# Patient Record
Sex: Male | Born: 1945 | Race: White | Hispanic: No | Smoking: Current every day smoker
Health system: Southern US, Community
[De-identification: ages and names within clinical notes are randomized; demographics above are authoritative.]

## PROBLEM LIST (undated history)

## (undated) DIAGNOSIS — I714 Abdominal aortic aneurysm, without rupture, unspecified: Secondary | ICD-10-CM

## (undated) DIAGNOSIS — F329 Major depressive disorder, single episode, unspecified: Secondary | ICD-10-CM

## (undated) DIAGNOSIS — F32A Depression, unspecified: Secondary | ICD-10-CM

## (undated) DIAGNOSIS — F419 Anxiety disorder, unspecified: Secondary | ICD-10-CM

## (undated) DIAGNOSIS — I219 Acute myocardial infarction, unspecified: Secondary | ICD-10-CM

## (undated) DIAGNOSIS — G40909 Epilepsy, unspecified, not intractable, without status epilepticus: Secondary | ICD-10-CM

## (undated) DIAGNOSIS — Z87828 Personal history of other (healed) physical injury and trauma: Secondary | ICD-10-CM

## (undated) DIAGNOSIS — I82409 Acute embolism and thrombosis of unspecified deep veins of unspecified lower extremity: Secondary | ICD-10-CM

## (undated) DIAGNOSIS — M1990 Unspecified osteoarthritis, unspecified site: Secondary | ICD-10-CM

## (undated) HISTORY — DX: Acute embolism and thrombosis of unspecified deep veins of unspecified lower extremity: I82.409

## (undated) HISTORY — DX: Unspecified osteoarthritis, unspecified site: M19.90

## (undated) HISTORY — DX: Major depressive disorder, single episode, unspecified: F32.9

## (undated) HISTORY — DX: Abdominal aortic aneurysm, without rupture: I71.4

## (undated) HISTORY — DX: Abdominal aortic aneurysm, without rupture, unspecified: I71.40

## (undated) HISTORY — DX: Depression, unspecified: F32.A

## (undated) HISTORY — DX: Acute myocardial infarction, unspecified: I21.9

## (undated) HISTORY — PX: SPINE SURGERY: SHX786

## (undated) HISTORY — DX: Anxiety disorder, unspecified: F41.9

## (undated) HISTORY — DX: Personal history of other (healed) physical injury and trauma: Z87.828

## (undated) HISTORY — PX: CARDIAC CATHETERIZATION: SHX172

## (undated) HISTORY — DX: Epilepsy, unspecified, not intractable, without status epilepticus: G40.909

## (undated) HISTORY — PX: VASECTOMY: SHX75

---

## 1981-02-05 DIAGNOSIS — Z87828 Personal history of other (healed) physical injury and trauma: Secondary | ICD-10-CM

## 1981-02-05 HISTORY — PX: BACK SURGERY: SHX140

## 1981-02-05 HISTORY — DX: Personal history of other (healed) physical injury and trauma: Z87.828

## 1998-04-01 ENCOUNTER — Ambulatory Visit (HOSPITAL_COMMUNITY): Admission: RE | Admit: 1998-04-01 | Discharge: 1998-04-01 | Payer: Self-pay | Admitting: Internal Medicine

## 1998-04-01 ENCOUNTER — Encounter: Payer: Self-pay | Admitting: Internal Medicine

## 1998-04-25 ENCOUNTER — Encounter: Admission: RE | Admit: 1998-04-25 | Discharge: 1998-07-24 | Payer: Self-pay | Admitting: Anesthesiology

## 1998-04-25 ENCOUNTER — Encounter: Payer: Self-pay | Admitting: Anesthesiology

## 2001-04-22 ENCOUNTER — Encounter: Admission: RE | Admit: 2001-04-22 | Discharge: 2001-04-22 | Payer: Self-pay | Admitting: Internal Medicine

## 2001-04-22 ENCOUNTER — Encounter: Payer: Self-pay | Admitting: Internal Medicine

## 2001-05-28 ENCOUNTER — Encounter: Payer: Self-pay | Admitting: Neurology

## 2001-05-28 ENCOUNTER — Ambulatory Visit (HOSPITAL_COMMUNITY): Admission: RE | Admit: 2001-05-28 | Discharge: 2001-05-28 | Payer: Self-pay | Admitting: Neurology

## 2001-12-09 ENCOUNTER — Emergency Department (HOSPITAL_COMMUNITY): Admission: EM | Admit: 2001-12-09 | Discharge: 2001-12-09 | Payer: Self-pay | Admitting: Emergency Medicine

## 2001-12-09 ENCOUNTER — Encounter: Payer: Self-pay | Admitting: Emergency Medicine

## 2003-01-09 ENCOUNTER — Emergency Department (HOSPITAL_COMMUNITY): Admission: EM | Admit: 2003-01-09 | Discharge: 2003-01-09 | Payer: Self-pay | Admitting: Emergency Medicine

## 2004-08-06 ENCOUNTER — Emergency Department (HOSPITAL_COMMUNITY): Admission: EM | Admit: 2004-08-06 | Discharge: 2004-08-06 | Payer: Self-pay | Admitting: Emergency Medicine

## 2004-08-19 ENCOUNTER — Emergency Department (HOSPITAL_COMMUNITY): Admission: EM | Admit: 2004-08-19 | Discharge: 2004-08-20 | Payer: Self-pay | Admitting: Emergency Medicine

## 2006-04-22 ENCOUNTER — Emergency Department (HOSPITAL_COMMUNITY): Admission: EM | Admit: 2006-04-22 | Discharge: 2006-04-22 | Payer: Self-pay | Admitting: Family Medicine

## 2006-05-24 ENCOUNTER — Encounter: Admission: RE | Admit: 2006-05-24 | Discharge: 2006-05-24 | Payer: Self-pay | Admitting: Orthopaedic Surgery

## 2006-07-05 ENCOUNTER — Ambulatory Visit: Payer: Self-pay | Admitting: Vascular Surgery

## 2007-07-30 ENCOUNTER — Ambulatory Visit: Payer: Self-pay | Admitting: Vascular Surgery

## 2008-08-04 ENCOUNTER — Ambulatory Visit: Payer: Self-pay | Admitting: Vascular Surgery

## 2008-08-05 DIAGNOSIS — I219 Acute myocardial infarction, unspecified: Secondary | ICD-10-CM

## 2008-08-05 HISTORY — DX: Acute myocardial infarction, unspecified: I21.9

## 2009-07-27 ENCOUNTER — Ambulatory Visit: Payer: Self-pay | Admitting: Vascular Surgery

## 2009-08-14 ENCOUNTER — Encounter: Payer: Self-pay | Admitting: Cardiology

## 2009-08-16 ENCOUNTER — Encounter: Payer: Self-pay | Admitting: Cardiology

## 2009-08-31 ENCOUNTER — Telehealth (INDEPENDENT_AMBULATORY_CARE_PROVIDER_SITE_OTHER): Payer: Self-pay | Admitting: *Deleted

## 2009-09-08 ENCOUNTER — Telehealth: Payer: Self-pay | Admitting: Cardiology

## 2009-09-20 DIAGNOSIS — I714 Abdominal aortic aneurysm, without rupture: Secondary | ICD-10-CM

## 2009-09-21 ENCOUNTER — Ambulatory Visit: Payer: Self-pay | Admitting: Cardiology

## 2009-09-21 DIAGNOSIS — I10 Essential (primary) hypertension: Secondary | ICD-10-CM | POA: Insufficient documentation

## 2009-09-27 ENCOUNTER — Telehealth (INDEPENDENT_AMBULATORY_CARE_PROVIDER_SITE_OTHER): Payer: Self-pay | Admitting: *Deleted

## 2009-09-28 ENCOUNTER — Ambulatory Visit: Payer: Self-pay | Admitting: Cardiology

## 2009-09-28 ENCOUNTER — Ambulatory Visit: Payer: Self-pay

## 2009-09-28 ENCOUNTER — Encounter: Payer: Self-pay | Admitting: Cardiology

## 2009-09-28 ENCOUNTER — Encounter (HOSPITAL_COMMUNITY): Admission: RE | Admit: 2009-09-28 | Discharge: 2009-10-25 | Payer: Self-pay | Admitting: Cardiology

## 2009-09-29 ENCOUNTER — Ambulatory Visit: Payer: Self-pay | Admitting: Internal Medicine

## 2009-09-29 DIAGNOSIS — E118 Type 2 diabetes mellitus with unspecified complications: Secondary | ICD-10-CM

## 2009-09-29 DIAGNOSIS — E1165 Type 2 diabetes mellitus with hyperglycemia: Secondary | ICD-10-CM | POA: Insufficient documentation

## 2009-09-29 DIAGNOSIS — F17200 Nicotine dependence, unspecified, uncomplicated: Secondary | ICD-10-CM | POA: Insufficient documentation

## 2009-09-29 LAB — CONVERTED CEMR LAB
BUN: 10 mg/dL (ref 6–23)
Basophils Absolute: 0.1 10*3/uL (ref 0.0–0.1)
Basophils Relative: 0.8 % (ref 0.0–3.0)
Chloride: 104 meq/L (ref 96–112)
Eosinophils Absolute: 0.3 10*3/uL (ref 0.0–0.7)
Eosinophils Relative: 4.5 % (ref 0.0–5.0)
Lymphocytes Relative: 42 % (ref 12.0–46.0)
MCV: 96.7 fL (ref 78.0–100.0)
Monocytes Absolute: 0.5 10*3/uL (ref 0.1–1.0)
Monocytes Relative: 8.1 % (ref 3.0–12.0)
Neutrophils Relative %: 44.6 % (ref 43.0–77.0)
Platelets: 219 10*3/uL (ref 150.0–400.0)
RDW: 13.9 % (ref 11.5–14.6)
Sodium: 138 meq/L (ref 135–145)
Total Protein: 7.2 g/dL (ref 6.0–8.3)
WBC: 6.7 10*3/uL (ref 4.5–10.5)

## 2009-10-03 ENCOUNTER — Encounter: Admission: RE | Admit: 2009-10-03 | Discharge: 2009-10-03 | Payer: Self-pay | Admitting: Internal Medicine

## 2009-10-13 ENCOUNTER — Ambulatory Visit: Payer: Self-pay | Admitting: Internal Medicine

## 2009-10-13 LAB — CONVERTED CEMR LAB: LDL Goal: 70 mg/dL

## 2009-10-20 ENCOUNTER — Ambulatory Visit: Payer: Self-pay | Admitting: Cardiology

## 2009-10-21 ENCOUNTER — Encounter: Payer: Self-pay | Admitting: Internal Medicine

## 2009-10-25 LAB — CONVERTED CEMR LAB
Alkaline Phosphatase: 43 units/L (ref 39–117)
Bilirubin, Direct: 0.2 mg/dL (ref 0.0–0.3)
Cholesterol: 134 mg/dL (ref 0–200)
HDL: 34.2 mg/dL — ABNORMAL LOW (ref >39.00)
Total Bilirubin: 0.4 mg/dL (ref 0.3–1.2)
Total CHOL/HDL Ratio: 4
Triglycerides: 127 mg/dL (ref 0.0–149.0)

## 2009-12-09 ENCOUNTER — Telehealth: Payer: Self-pay | Admitting: Cardiology

## 2009-12-19 ENCOUNTER — Ambulatory Visit: Payer: Self-pay

## 2009-12-19 ENCOUNTER — Ambulatory Visit: Payer: Self-pay | Admitting: Cardiology

## 2009-12-19 ENCOUNTER — Ambulatory Visit (HOSPITAL_COMMUNITY): Admission: RE | Admit: 2009-12-19 | Discharge: 2009-12-19 | Payer: Self-pay | Admitting: Cardiology

## 2009-12-19 ENCOUNTER — Encounter: Payer: Self-pay | Admitting: Cardiology

## 2009-12-21 ENCOUNTER — Encounter: Payer: Self-pay | Admitting: Cardiology

## 2009-12-22 ENCOUNTER — Ambulatory Visit: Payer: Self-pay | Admitting: Cardiology

## 2010-03-03 ENCOUNTER — Telehealth: Payer: Self-pay | Admitting: Cardiology

## 2010-03-07 NOTE — Assessment & Plan Note (Signed)
Summary: np6/stint placement fu/mt   Visit Type:  new pt visit Primary Provider:  NO PCP AT THIS TIME  CC:  EDEMA/ANKLES...SOB....DENIES ANY CP.  History of Present Illness: 65 yo with history of diabetes and smoking presents for followup after recent inferior MI with PCI at [facility] in [place].  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  He was in [place] in 7/11 when he developed severe chest pain radiating to his neck and was admitted to [facility] with inferior MI.  He had a subtotal RCA occlusion and had 2 Xience DES to the RCA.  EF was 50% by LV-gram and echo.  Since discharge from the hospital, he has been taking his Crestor, diabetes meds, and aspirin but he has not been taking his Effient, ramipril, or Toprol XL due to cost.  Since discharge, he has been feeling well with no recurrent chest pain.  He has been somewhat limited by back pain for years (disabled) but does not have significant exertional dyspnea.  He does have some mild shortness of breath with a flight of steps.  He has been splitting wood in his back yard with no problem.  He has cut back on smoking to 6 cigarettes a day.  He has been taking his diabetes meds.  Fasting blood sugar is running in the 130s.    ECG: NSR, small inferior Qs  Labs (7/11): creatinine 0.87, TnI 8.89  Current Medications (verified): 1)  Glipizide 5 Mg Tabs (Glipizide) .Marland Kitchen.. 1 Tab Once Daily 2)  Crestor 20 Mg Tabs (Rosuvastatin Calcium) .Marland Kitchen.. 1 Tab At Bedtime 3)  Aspirin Ec 325 Mg Tbec (Aspirin) .... Take One Tablet By Mouth Daily 4)  Metformin Hcl 1000 Mg Tabs (Metformin Hcl) .Marland Kitchen.. 1 Tab Two Times A Day 5)  Metoprolol Succinate 25 Mg Xr24h-Tab (Metoprolol Succinate) .... Pt Has Not Picked These Up Yet Said Could Not Afford 6)  Effient 10 Mg Tabs (Prasugrel Hcl) .... Pt Has Not Picked This Up Yet Due To Could Not Afford 7)  Ramipril 2.5 Mg Caps (Ramipril) .... Pt Has Not Picked This Up Yet Due To Could Not Afford  Allergies (verified): 1)  !  Novocain  Past History:  Past Medical History: 1.  AAA: Followed by [facility], last measured 2.3 x 3.5 by ultrasound in 6/11. 2.  CAD: Inferior MI 7/11, hospitalized at [facility] in [place].  LHC: EF 50% with mild inferior hypokinesis, 70% calcified mid LAD, nearly occluded proximal RCA, 60% mid and distal CFX.  Patient had 3.0 x 28 and 3.0 x 18 Xience DES in RCA.  Echo (7/11): Mild LVH, EF 50% with inferior mild hypokinesis, RV normal 3.  Smoker 4.  Diabetes: Poorly controlled in the past 5.  History of back surgery, disabled from back pain.   Family History: Father with MI at 72, Mother with MI at 90, sister and brother both with MIs in 52s  Social History: Lives with son in [place].  Disabled from back pain.  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  Smokes 6 cigs/day (more before).    Review of Systems       All systems reviewed and negative except as per HPI.   Vital Signs:  Patient profile:   65 year old male Height:      69 inches Weight:      155 pounds BMI:     22.97 Pulse rate:   83 / minute Pulse rhythm:   regular BP sitting:   142 / 78  (  left arm) Cuff size:   regular  Vitals Entered By: Danielle Rankin, CMA (September 21, 2009 10:43 AM)  Physical Exam  General:  Well developed, well nourished, in no acute distress. Head:  normocephalic and atraumatic Nose:  no deformity, discharge, inflammation, or lesions Mouth:  Teeth, gums and palate normal. Oral mucosa normal. Neck:  Neck supple, no JVD. No masses, thyromegaly or abnormal cervical nodes. Lungs:  Slightly decreased breath sounds bilaterally.  Heart:  Non-displaced PMI, chest non-tender; regular rate and rhythm, S1, S2. 1/6 HSM at apex.  +S4. Carotid upstroke normal, no bruit. Pedals normal pulses. No edema, no varicosities. Abdomen:  Bowel sounds positive; abdomen soft and non-tender without masses, organomegaly, or hernias noted. No hepatosplenomegaly. Msk:  Back normal, normal gait. Muscle strength and tone  normal. Extremities:  No clubbing or cyanosis. Neurologic:  Alert and oriented x 3. Skin:  Intact without lesions or rashes. Psych:  Normal affect.   Impression & Recommendations:  Problem # 1:  CORONARY ATHEROSCLEROSIS NATIVE CORONARY ARTERY (ICD-414.01) Status post inferior MI with no ischemic symptoms.  He has residual moderate disease (70%) in the mid LAD and 60% in the CFX.  He has not been taking Effient since discharge from the hospital.  EF 50% on echo and LV-gram in 7/11.  - He received Effient 60 mg load in the office today and was given samples to restart Effient 10 mg daily.  He has prescription coverage through Medicare.  I strongly counselled him on the need to take this medication for at least a year.  - I am going to have him take Coreg 6.25 mg two times a day instead of Toprol XL (less expensive).  - Crestor is going to be too expensive for him as well.  I will replace this with simvastatin 40 mg daily and check lipids/LFTs on this med.  - He is going to restart ramipril 2.5 mg daily.  - He will continue ASA 325.  - I am going to set him up for cardiac rehab.  - I am going to set him up for a Lexiscan myoview to assess for ischemia in the LAD/CFX territories given at least moderate lesions in these areas on last cath.  - Echocardiogram in 3 months.   Problem # 2:  ESSENTIAL HYPERTENSION (ICD-401.9) He is going to start Coreg and restart ramipril.  BP is mildly elevated today.   Problem # 3:  SMOKING Patient is cutting back.  I counselled him to stop completely.  He will get nicotine patches (14 mg/day).   Problem # 4:  HYPERCHOLESTEROLEMIA (ICD-272.0) Goal LDL < 70.  He is going to be on simvastatin because of expense with Crestor or Lipitor.  Followup lipids/LFTs.   Problem # 5:  DIABETES Better controlled per patient's report.  Taking metformin and glyburide.  Will check hgbA1c with lipids.  He needs a PCP (will arrange at [facility] office).   Other Orders: Nuclear  Stress Test (Nuc Stress Test) Primary Care Referral (Primary) Echocardiogram (Echo) Cardiac Rehabilitation (Cardiac Rehab)  Patient Instructions: 1)  Your physician has recommended you make the following change in your medication:  2)  Take Effient 10mg  daily---this is important--it helps keep you arteries open. 3)  Stop Metoprolol (Toprol) 4)  Start Coreg 6.25mg  twice a day. 5)  Stop Crestor. 6)  Start Simvastatin(Zocor) 40mg  daily in the evening. 7)  Start Ramipril 2.5mg  twice a day. 8)  Use a Nicotine Patch 14mg   to help you stop smoking. 9)  Your physician recommends that you return for a FASTING lipid profile/liver profile/Hgb A1C in 1 month---414.01 401.9 272.0    10)  Your physician has requested that you have an lexiscan  myoview.  For further information please visit [email or web address]  Please follow instruction sheet, as given. 11)  Your physician has requested that you have an echocardiogram.  Echocardiography is a painless test that uses sound waves to create images of your heart. It provides your doctor with information about the size and shape of your heart and how well your heart's chambers and valves are working.  This procedure takes approximately one hour. There are no restrictions for this procedure. IN 3 MONTHS 12)  Your physician recommends that you schedule a follow-up appointment in: 3 months with Dr Shirlee Latch a few days after you have the echo. 13)  You have been referred to primary care. 14)  Your physician recommends referral and attendance at a Cardiac Rehab Program. Prescriptions: NICOTINE 14 MG/24HR PT24 (NICOTINE) use one daily as directed  #30 x 2   Entered by:   Katina Dung, RN, BSN   Authorized by:   Marca Ancona, MD   Signed by:   Katina Dung, RN, BSN on 09/21/2009   Method used:   Print then Give to Patient   RxID:   [id number] EFFIENT 10 MG TABS (PRASUGREL HCL) one daily  #30 x 11   Entered by:   Katina Dung, RN, BSN   Authorized by:    Marca Ancona, MD   Signed by:   Katina Dung, RN, BSN on 09/21/2009   Method used:   Electronically to        [facility] (retail)       [address]       [place], [place]  [address]       Ph: [phone]       Fax: [phone]   RxID:   [id number] RAMIPRIL 2.5 MG CAPS (RAMIPRIL) one twice a day  #60 x 11   Entered by:   Katina Dung, RN, BSN   Authorized by:   Marca Ancona, MD   Signed by:   Katina Dung, RN, BSN on 09/21/2009   Method used:   Electronically to        [facility] (retail)       [address]       [place], [place]  [address]       Ph: [phone]       Fax: [phone]   RxID:   [id number] COREG 6.25 MG TABS (CARVEDILOL) one twice a day  #60 x 11   Entered by:   Katina Dung, RN, BSN   Authorized by:   Marca Ancona, MD   Signed by:   Katina Dung, RN, BSN on 09/21/2009   Method used:   Electronically to        [facility] (retail)       [address]       [place], [place]  [address]       Ph: [phone]       Fax: [phone]   RxID:   [id number] ZOCOR 40 MG TABS (SIMVASTATIN) one daily in the evening  #30 x 3   Entered by:   Katina Dung, RN, BSN   Authorized by:   Marca Ancona,  MD   Signed by:   Katina Dung, RN, BSN on 09/21/2009   Method used:   Electronically to        [facility] (retail)       [address]       [place], [place]  [address]       Ph: [phone]       Fax: [phone]   RxID:   [id number]

## 2010-03-07 NOTE — Progress Notes (Signed)
  ROI faxed over to [facility]. Arthur Evans  August 31, 2009 8:40 AM     Appended Document:  Recieved records from [facility] gave to [place]

## 2010-03-07 NOTE — Letter (Signed)
Summary: [employer] [facility]   Imported By: Roderic Ovens 10/06/2009 14:42:31  _____________________________________________________________________  External Attachment:    Type:   Image     Comment:   External Document

## 2010-03-07 NOTE — Assessment & Plan Note (Signed)
Summary: Cardiology Nuclear Testing  Nuclear Med Background Indications for Stress Test: Evaluation for Ischemia, Stent Patency   History: Echo, Heart Catheterization, Myocardial Perfusion Study, Stents  History Comments: 7/11 Echo:EF=50%; 7/11 MI>Stent-RCA; AAA (3.3 x 3.2 cm 6/11)  Symptoms: Chest Pain, DOE    Nuclear Pre-Procedure Cardiac Risk Factors: Hypertension, Lipids, NIDDM, Smoker Caffeine/Decaff Intake: None NPO After: 6:30 AM Lungs: CLEAR IV 0.9% NS with Angio Cath: 22g     IV Site: R Hand IV Started by: Bonnita Levan, RN Chest Size (in) 38     Height (in): 69 Weight (lb): 154 BMI: 22.82  Nuclear Med Study 1 or 2 day study:  1 day     Stress Test Type:  Eugenie Birks Reading MD:  Marca Ancona, MD     Referring MD:  [facility] Resting Radionuclide:  Technetium 88m Tetrofosmin     Resting Radionuclide Dose:  11 mCi  Stress Radionuclide:  Technetium 47m Tetrofosmin     Stress Radionuclide Dose:  33 mCi   Stress Protocol   Lexiscan: 0.4 mg   Stress Test Technologist:  Frederick Peers, EMT-P     Nuclear Technologist:  Doyne Keel, CNMT  Rest Procedure  Myocardial perfusion imaging was performed at rest 45 minutes following the intravenous administration of Technetium 11m Tetrofosmin.  Stress Procedure  The patient received IV Lexiscan 0.4 mg over 15-seconds.  Technetium 33m Tetrofosmin injected at 30-seconds.  There were no significant changes with infusion.  Quantitative spect images were obtained after a 45 minute delay.  QPS Raw Data Images:  Normal; no motion artifact; normal heart/lung ratio. Stress Images:  Basal to mid inferior perfusion defect.  Rest Images:  Basal to mid inferior perfusion defect.  Subtraction (SDS):  Fixed basal to mid inferior perfusion defect.  Transient Ischemic Dilatation:  1.07  (Normal <1.22)  Lung/Heart Ratio:  .34  (Normal <0.45)  Quantitative Gated Spect Images QGS EDV:  82 ml QGS ESV:  33 ml QGS EF:  59 % QGS cine images:   Mild basal inferior hypokinesis.    Overall Impression  Exercise Capacity: Lexiscan with no exercise. BP Response: Hypotensive blood pressure response. Clinical Symptoms: Pressure in chest  ECG Impression: No significant ST segment change suggestive of ischemia. Overall Impression: Fixed basal to mid inferior perfusion defect suggestive of infarction with no ischemia.  Overall Impression Comments: Preserved LV systolic function.   Appended Document: Cardiology Nuclear Testing Looks like old infarction with no ischemia.  Preserved LV systolic function.   Appended Document: Cardiology Nuclear Testing discussed results with pt by telephone

## 2010-03-07 NOTE — Consult Note (Signed)
Summary: [employer]	[place] System Consultation Reports  [facility] System Consultation Reports   Imported By: Roderic Ovens 10/06/2009 14:44:27  _____________________________________________________________________  External Attachment:    Type:   Image     Comment:   External Document

## 2010-03-07 NOTE — Miscellaneous (Signed)
Summary: [facility] Cardiac Physician Order/Treatment Plan   [facility] Cardiac Physician Order/Treatment Plan   Imported By: Roderic Ovens 10/03/2009 12:46:41  _____________________________________________________________________  External Attachment:    Type:   Image     Comment:   External Document

## 2010-03-07 NOTE — Assessment & Plan Note (Signed)
Summary: NEW MEDICARE PT PER SHARONDA/DR MCLEAN-#--PKG--STC   Vital Signs:  Patient profile:   65 year old male Height:      69 inches Weight:      153.75 pounds BMI:     22.79 O2 Sat:      98 % on Room air Temp:     98.3 degrees F oral Pulse rate:   74 / minute Pulse rhythm:   regular Resp:     16 per minute BP sitting:   100 / 62  (left arm) Cuff size:   large  Vitals Entered By: Rock Nephew CMA (September 29, 2009 1:18 PM)  O2 Flow:  Room air  Primary Care Provider:  Etta Grandchild MD   History of Present Illness: New to me this gentleman needs a PCP. He says that he doesn't have enough time today for a physical and he says that he feels well today.  Preventive Screening-Counseling & Management  Alcohol-Tobacco     Alcohol drinks/day: 0     Smoking Status: current     Smoking Cessation Counseling: yes     Smoke Cessation Stage: precontemplative     Packs/Day: 1.5     Year Started: 1958     Pack years: 65     Tobacco Counseling: to quit use of tobacco products  Caffeine-Diet-Exercise     Does Patient Exercise: no  Hep-HIV-STD-Contraception     Hepatitis Risk: no risk noted     HIV Risk: no risk noted     STD Risk: no risk noted      Sexual History:  not active.        Drug Use:  no.        Blood Transfusions:  prior to 1987.    Medications Prior to Update: 1)  Glipizide 5 Mg Tabs (Glipizide) .Marland Kitchen.. 1 Tab Once Daily 2)  Zocor 40 Mg Tabs (Simvastatin) .... One Daily in The Evening 3)  Aspirin Ec 325 Mg Tbec (Aspirin) .... Take One Tablet By Mouth Daily 4)  Metformin Hcl 1000 Mg Tabs (Metformin Hcl) .Marland Kitchen.. 1 Tab Two Times A Day 5)  Coreg 6.25 Mg Tabs (Carvedilol) .... One Twice A Day 6)  Effient 10 Mg Tabs (Prasugrel Hcl) .... One Daily 7)  Ramipril 2.5 Mg Caps (Ramipril) .... One Twice A Day 8)  Nicotine 14 Mg/24hr Pt24 (Nicotine) .... Use One Daily As Directed  Current Medications (verified): 1)  Glipizide 5 Mg Tabs (Glipizide) .Marland Kitchen.. 1 Tab Once Daily 2)   Zocor 40 Mg Tabs (Simvastatin) .... One Daily in The Evening 3)  Aspirin Ec 325 Mg Tbec (Aspirin) .... Take One Tablet By Mouth Daily 4)  Metformin Hcl 1000 Mg Tabs (Metformin Hcl) .Marland Kitchen.. 1 Tab Two Times A Day 5)  Coreg 6.25 Mg Tabs (Carvedilol) .... One Twice A Day 6)  Effient 10 Mg Tabs (Prasugrel Hcl) .... One Daily 7)  Ramipril 2.5 Mg Caps (Ramipril) .... One Twice A Day 8)  Nicotine 14 Mg/24hr Pt24 (Nicotine) .... Use One Daily As Directed  Allergies (verified): 1)  ! Novocain  Past History:  Past Medical History: 1.  AAA: Followed by [facility], last measured 2.3 x 3.5 by ultrasound in 6/11. 2.  CAD: Inferior MI 7/11, hospitalized at [facility] in [place].  LHC: EF 50% with mild inferior hypokinesis, 70% calcified mid LAD, nearly occluded proximal RCA, 60% mid and distal CFX.  Patient had 3.0 x 28 and 3.0 x 18 Xience DES in RCA.  Echo (7/11): Mild  LVH, EF 50% with inferior mild hypokinesis, RV normal 3.  Smoker 4.  Diabetes: Poorly controlled in the past 5.  History of back surgery, disabled from back pain.  Diabetes mellitus, type II "Crushed back" in 1983  Past Surgical History: Lumbar laminectomy Lumbar fusion  Social History: Lives with son in [place].  Disabled from back pain.  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  Smokes 6 cigs/day (more before).   Alcohol use-no Drug use-no Regular exercise-no Smoking Status:  current Packs/Day:  1.5 Hepatitis Risk:  no risk noted HIV Risk:  no risk noted STD Risk:  no risk noted Sexual History:  not active Blood Transfusions:  prior to 1987 Drug Use:  no Does Patient Exercise:  no  Review of Systems  The patient denies anorexia, fever, weight loss, weight gain, chest pain, syncope, dyspnea on exertion, peripheral edema, prolonged cough, headaches, hemoptysis, abdominal pain, hematuria, difficulty walking, and depression.    Physical Exam  General:  alert, well-developed, well-nourished, well-hydrated, appropriate dress, normal  appearance, healthy-appearing, cooperative to examination, and good hygiene.   Head:  normocephalic, atraumatic, no abnormalities observed, and no abnormalities palpated.   Neck:  supple, full ROM, no masses, no thyromegaly, no thyroid nodules or tenderness, no JVD, normal carotid upstroke, no carotid bruits, and no cervical lymphadenopathy.   Lungs:  normal respiratory effort, no intercostal retractions, no accessory muscle use, normal breath sounds, no dullness, no fremitus, no crackles, and no wheezes.   Heart:  normal rate, regular rhythm, no murmur, no gallop, no rub, and no JVD.   Abdomen:  soft, non-tender, normal bowel sounds, no distention, no masses, no guarding, no rigidity, no rebound tenderness, no abdominal hernia, no inguinal hernia, no hepatomegaly, and no splenomegaly.   Msk:  kyphosis and scoliosis.   Pulses:  R and L carotid,radial,femoral,dorsalis pedis and posterior tibial pulses are full and equal bilaterally Extremities:  No clubbing, cyanosis, edema, or deformity noted with normal full range of motion of all joints.   Neurologic:  No cranial nerve deficits noted. Station and gait are normal. Plantar reflexes are down-going bilaterally. DTRs are symmetrical throughout. Sensory, motor and coordinative functions appear intact. Skin:  turgor normal, color normal, no rashes, no suspicious lesions, no ecchymoses, no petechiae, no purpura, no ulcerations, no edema, excessive tan, and solar damage.   Cervical Nodes:  no anterior cervical adenopathy and no posterior cervical adenopathy.   Axillary Nodes:  no R axillary adenopathy and no L axillary adenopathy.   Inguinal Nodes:  no R inguinal adenopathy and no L inguinal adenopathy.   Psych:  Cognition and judgment appear intact. Alert and cooperative with normal attention span and concentration. No apparent delusions, illusions, hallucinations   Impression & Recommendations:  Problem # 1:  DIABETES MELLITUS, TYPE II  (ICD-250.00) Assessment Unchanged  His updated medication list for this problem includes:    Glipizide 5 Mg Tabs (Glipizide) .Marland Kitchen... 1 tab once daily    Aspirin Ec 325 Mg Tbec (Aspirin) .Marland Kitchen... Take one tablet by mouth daily    Metformin Hcl 1000 Mg Tabs (Metformin hcl) .Marland Kitchen... 1 tab two times a day    Ramipril 2.5 Mg Caps (Ramipril) ..... One twice a day  Orders: Venipuncture (16109) TLB-BMP (Basic Metabolic Panel-BMET) (80048-METABOL) TLB-CBC Platelet - w/Differential (85025-CBCD) TLB-Hepatic/Liver Function Pnl (80076-HEPATIC) TLB-TSH (Thyroid Stimulating Hormone) (84443-TSH) TLB-A1C / Hgb A1C (Glycohemoglobin) (83036-A1C) Ophthalmology Referral (Ophthalmology)  Problem # 2:  ESSENTIAL HYPERTENSION (ICD-401.9) Assessment: Improved  His updated medication list for this problem includes:  Coreg 6.25 Mg Tabs (Carvedilol) ..... One twice a day    Ramipril 2.5 Mg Caps (Ramipril) ..... One twice a day  Orders: Venipuncture (54098) TLB-BMP (Basic Metabolic Panel-BMET) (80048-METABOL) TLB-CBC Platelet - w/Differential (85025-CBCD) TLB-Hepatic/Liver Function Pnl (80076-HEPATIC) TLB-TSH (Thyroid Stimulating Hormone) (84443-TSH) TLB-A1C / Hgb A1C (Glycohemoglobin) (83036-A1C)  BP today: 100/62 Prior BP: 142/78 (09/21/2009)  Problem # 3:  HYPERCHOLESTEROLEMIA (ICD-272.0) Assessment: Unchanged  His updated medication list for this problem includes:    Zocor 40 Mg Tabs (Simvastatin) ..... One daily in the evening  Orders: Venipuncture (11914) TLB-BMP (Basic Metabolic Panel-BMET) (80048-METABOL) TLB-CBC Platelet - w/Differential (85025-CBCD) TLB-Hepatic/Liver Function Pnl (80076-HEPATIC) TLB-TSH (Thyroid Stimulating Hormone) (84443-TSH) TLB-A1C / Hgb A1C (Glycohemoglobin) (83036-A1C)  Problem # 4:  ABDOMINAL AORTIC ANEURYSM (ICD-441.4) Assessment: Unchanged  Orders: Radiology Referral (Radiology) Tobacco use cessation intermediate 3-10 minutes (78295)  Problem # 5:  TOBACCO  USE (ICD-305.1) Assessment: Unchanged  His updated medication list for this problem includes:    Nicotine 14 Mg/24hr Pt24 (Nicotine) ..... Use one daily as directed  Encouraged smoking cessation and discussed different methods for smoking cessation.   Orders: Tobacco use cessation intermediate 3-10 minutes (99406)  Complete Medication List: 1)  Glipizide 5 Mg Tabs (Glipizide) .Marland Kitchen.. 1 tab once daily 2)  Zocor 40 Mg Tabs (Simvastatin) .... One daily in the evening 3)  Aspirin Ec 325 Mg Tbec (Aspirin) .... Take one tablet by mouth daily 4)  Metformin Hcl 1000 Mg Tabs (Metformin hcl) .Marland Kitchen.. 1 tab two times a day 5)  Coreg 6.25 Mg Tabs (Carvedilol) .... One twice a day 6)  Effient 10 Mg Tabs (Prasugrel hcl) .... One daily 7)  Ramipril 2.5 Mg Caps (Ramipril) .... One twice a day 8)  Nicotine 14 Mg/24hr Pt24 (Nicotine) .... Use one daily as directed  Other Orders: TD Toxoids IM 7 YR + (62130) Admin 1st Vaccine (86578)  Patient Instructions: 1)  Please schedule a follow-up appointment in 1 month for a complete physical. 2)  Tobacco is very bad for your health and your loved ones! You Should stop smoking!. 3)  Stop Smoking Tips: Choose a Quit date. Cut down before the Quit date. decide what you will do as a substitute when you feel the urge to smoke(gum,toothpick,exercise). 4)  Check your blood sugars regularly. If your readings are usually above 200 or below 70 you should contact our office. 5)  It is important that your Diabetic A1c level is checked every 3 months. 6)  See your eye doctor yearly to check for diabetic eye damage. 7)  Check your feet each night for sore areas, calluses or signs of infection. 8)  Check your Blood Pressure regularly. If it is above 130/80: you should make an appointment.  Preventive Care Screening  Last Pneumovax:    Date:  02/05/2009    Results:  given     Immunizations Administered:  Tetanus Vaccine:    Vaccine Type: Td    Site: left deltoid     Mfr: Sanofi Pasteur    Dose: 0.5 ml    Route: IM    Given by: Rock Nephew CMA    Exp. Date: 03/09/2011    Lot #:  [id number]    VIS given: 12/24/06 version given September 29, 2009.  Not Administered:    Influenza Vaccine not given due to: declined

## 2010-03-07 NOTE — Progress Notes (Signed)
Summary: Nuclear pre procedure  Phone Note Outgoing Call Call back at [facility] Phone [phone]   Call placed by: Rea College, CMA,  September 27, 2009 4:02 PM Call placed to: Patient Summary of Call: Reviewed information on Myoview Information Sheet (see scanned document for further details).  Spoke with patient.      Nuclear Med Background Indications for Stress Test: Evaluation for Ischemia, Stent Patency   History: Echo, Heart Catheterization, Myocardial Perfusion Study, Stents  History Comments: 7/11 Echo:EF=50%; 7/11 MI>Stent-RCA; AAA (3.3 x 3.2 cm 6/11)  Symptoms: DOE    Nuclear Pre-Procedure Cardiac Risk Factors: Hypertension, Lipids, NIDDM, Smoker Height (in): 69

## 2010-03-07 NOTE — Progress Notes (Signed)
Summary: question re crestor  Phone Note Call from Patient   Caller: Patient Reason for Call: Talk to Nurse Summary of Call: pt records from hospital in [place] came, made appt for pt 8-17, taking crestor 20 mg 1 a day, onl;y has 8 pills left, can get a refill to last unitl appt? pls call [phone]  [facility] Initial call taken by: Glynda Jaeger,  September 08, 2009 9:40 AM  Follow-up for Phone Call        Spoke with pt. Pt. would like to have a prescription  [facility] for Crestor 20 mg once daily. Pt. is a new pt. in the office. Pt has an appointment with Dr. Shirlee Latch  on August 17th at 10:30 AM  I let pt. know can't send a prescription because he has not been seen in the office before. I would give crestor samples to last him untill he is seen By Dr. Shirlee Latch. 14/20 mg Crestor tablets placed at the front desk. Pt. aware. Follow-up by: Ollen Gross, RN, BSN,  September 08, 2009 10:09 AM

## 2010-03-07 NOTE — Cardiovascular Report (Signed)
Summary: Coronary Tree Drawing   Coronary Tree Drawing   Imported By: Roderic Ovens 10/06/2009 14:43:06  _____________________________________________________________________  External Attachment:    Type:   Image     Comment:   External Document

## 2010-03-07 NOTE — Assessment & Plan Note (Signed)
Summary: 3 MONTH ROV/SL   Visit Type:  Follow-up Primary Provider:  Etta Grandchild MD   History of Present Illness: 65 yo with history of diabetes and smoking presents for followup after recent inferior MI with PCI at [facility] in [place].  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  He was in [place] in 7/11 when he developed severe chest pain radiating to his neck and was admitted to [facility] with inferior MI.  He had a subtotal RCA occlusion and had 2 Xience DES to the RCA.  EF was 50% by LV-gram and echo.  Given residual moderate coronary disease in the left system, patient had Lexiscan myoview to assess for ischemia.  This showed a fixed basal to mid inferior defect with no ishemia.  Repeat echo showed EF 55-60%.   Since last appointment, patient is doing well.  He is taking all his meds.  He is down to smoking 2 cigarettes a day.  He is active, doing yardwork, cutting wood, etc.  He denies exertional dyspnea or chest pain with exertion.    ECG: NSR, small inferior Qs  Labs (7/11): creatinine 0.87, TnI 8.89 Labs (9/11): LDL 74, HDL 34, LFTs normal  Current Medications (verified): 1)  Glipizide 5 Mg Tabs (Glipizide) .Marland Kitchen.. 1 Tab Once Daily 2)  Zocor 40 Mg Tabs (Simvastatin) .... One Daily in The Evening 3)  Aspirin 81 Mg Tbec (Aspirin) .... Take One Tablet By Mouth Daily 4)  Coreg 6.25 Mg Tabs (Carvedilol) .... One Twice A Day 5)  Effient 10 Mg Tabs (Prasugrel Hcl) .... One Daily 6)  Ramipril 2.5 Mg Caps (Ramipril) .... One Twice A Day 7)  Nicotine 14 Mg/24hr Pt24 (Nicotine) .... Use One Daily As Directed 8)  Janumet 50-1000 Mg Tabs (Sitagliptin-Metformin Hcl) .... One By Mouth Two Times A Day  Allergies: 1)  ! Novocain  Past History:  Past Medical History: 1.  AAA: Followed by [facility], last measured 2.3 x 3.5 by ultrasound in 6/11. 2.  CAD: Inferior MI 7/11, hospitalized at [facility] in [place].  LHC: EF 50% with mild inferior hypokinesis, 70% calcified mid LAD, nearly occluded proximal RCA,  60% mid and distal CFX.  Patient had 3.0 x 28 and 3.0 x 18 Xience DES in RCA.  Echo (7/11): Mild LVH, EF 50% with inferior mild hypokinesis, RV normal.  Lexsican myoview (8/11) done due to residual moderate disease in the left system showed EF 59% with old basal to mid inferior MI and no ischemia.  Repeat echo (9/11) showed EF 55-60%, basal inferior hypokinesis, mild biatrial enlargement.  3.  Smoker 4.  Diabetes: Poorly controlled in the past 5.  History of back surgery, disabled from back pain.  6.  Back problems 7.  Hyperlipidemia  Family History: Reviewed history from 09/21/2009 and no changes required. Father with MI at 29, Mother with MI at 58, sister and brother both with MIs in 21s  Social History: Reviewed history from 09/29/2009 and no changes required. Lives with son in [place].  Disabled from back pain.  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  Smokes 2 cigs/day (more before).   Alcohol use-no Drug use-no Regular exercise-no  Vital Signs:  Patient profile:   65 year old male Height:      69 inches Weight:      151 pounds BMI:     22.38 Pulse rate:   71 / minute BP sitting:   120 / 78  (left arm)  Vitals Entered By: Laurance Flatten  CMA (December 22, 2009 11:19 AM)  Physical Exam  General:  Well developed, well nourished, in no acute distress. Neck:  Neck supple, no JVD. No masses, thyromegaly or abnormal cervical nodes. Lungs:  Clear bilaterally to auscultation and percussion. Heart:  Non-displaced PMI, chest non-tender; regular rate and rhythm, S1, S2 without murmurs, rubs or gallops. Carotid upstroke normal, no bruit.  Pedals normal pulses. No edema, no varicosities. Abdomen:  Bowel sounds positive; abdomen soft and non-tender without masses, organomegaly, or hernias noted. No hepatosplenomegaly. Extremities:  No clubbing or cyanosis. Neurologic:  Alert and oriented x 3. Psych:  Normal affect.   Impression & Recommendations:  Problem # 1:  CORONARY ATHEROSCLEROSIS  NATIVE CORONARY ARTERY (ICD-414.01) Patient is doing well with no recurrent ischemic symptoms.  EF 55-60% on most recent echo.  Continue ASA, Coreg, statin, Effient, ramipril.   Problem # 2:  TOBACCO USE (ICD-305.1) I strongly encouraged the patient to quit completely.   Problem # 3:  HYPERCHOLESTEROLEMIA (ICD-272.0) LDL is close to goal (< 70).  Will draw lipids/LFTs in 3/11.   Patient Instructions: 1)  Your physician recommends that you return for a FASTING lipid profile/liver profile March 2012. 414.01 2)  Your physician wants you to follow-up in: 6 months with Dr Shirlee Latch.  You will receive a reminder letter in the mail two months in advance. If you don't receive a letter, please call our office to schedule the follow-up appointment.

## 2010-03-07 NOTE — Assessment & Plan Note (Signed)
Summary: pt res'd letter to sched appt/#/cd--rm 9   Vital Signs:  Patient profile:   65 year old male Height:      69 inches Weight:      153 pounds BMI:     22.68 O2 Sat:      98 % on Room air Temp:     97.6 degrees F oral Pulse rate:   84 / minute Pulse rhythm:   regular Resp:     18 per minute BP sitting:   100 / 60  (left arm) Cuff size:   regular  Vitals Entered By: Mervin Kung CMA Duncan Dull) (October 13, 2009 10:51 AM)  O2 Flow:  Room air CC: Rm 9  Follow up after labs, Lipid Management Is Patient Diabetic? Yes Pain Assessment Patient in pain? no      Comments Pt states he has not been taking his metformin because he thought a doctor on [address] took him off of it. Nicki Guadalajara Fergerson CMA Duncan Dull)  October 13, 2009 10:53 AM    Primary Care Ramina Hulet:  Etta Grandchild MD  CC:  Rm 9  Follow up after labs and Lipid Management.  History of Present Illness:  Follow-Up Visit      This is a 65 year old man who presents for Follow-up visit.  The patient denies chest pain, palpitations, dizziness, syncope, low blood sugar symptoms, high blood sugar symptoms, edema, SOB, DOE, PND, and orthopnea.  Since the last visit the patient notes no new problems or concerns.  The patient reports taking meds as prescribed, monitoring blood sugars, and dietary compliance.  When questioned about possible medication side effects, the patient notes none.    Lipid Management History:      Positive NCEP/ATP III risk factors include male age 66 years old or older, diabetes, current tobacco user, hypertension, ASHD (either angina/prior MI/prior CABG), peripheral vascular disease, and aortic aneurysm.        The patient states that he knows about the "Therapeutic Lifestyle Change" diet.  His compliance with the TLC diet is good.  The patient expresses understanding of adjunctive measures for cholesterol lowering.  Adjunctive measures started by the patient include aerobic exercise, fiber, ASA,  omega-3 supplements, limit alcohol consumpton, and weight reduction.  He expresses no side effects from his lipid-lowering medication.  The patient denies any symptoms to suggest myopathy or liver disease.    Preventive Screening-Counseling & Management  Alcohol-Tobacco     Alcohol drinks/day: 0     Smoking Status: current     Smoking Cessation Counseling: yes     Smoke Cessation Stage: precontemplative     Packs/Day: 1.5     Year Started: 1958     Pack years: 55     Tobacco Counseling: to quit use of tobacco products  Hep-HIV-STD-Contraception     Hepatitis Risk: no risk noted     HIV Risk: no risk noted     STD Risk: no risk noted      Sexual History:  not active.        Drug Use:  no.        Blood Transfusions:  prior to 1987.    Clinical Review Panels:  Immunizations   Last Tetanus Booster:  Td (09/29/2009)   Last Pneumovax:  given (02/05/2009)  Diabetes Management   HgBA1C:  8.4 (09/29/2009)   Creatinine:  1.0 (09/29/2009)   Last Dilated Eye Exam:  normal (10/12/2009)   Last Foot Exam:  yes (10/13/2009)  Last Pneumovax:  given (02/05/2009)  CBC   WBC:  6.7 (09/29/2009)   RBC:  3.97 (09/29/2009)   Hgb:  13.3 (09/29/2009)   Hct:  38.3 (09/29/2009)   Platelets:  219.0 (09/29/2009)   MCV  96.7 (09/29/2009)   MCHC  34.7 (09/29/2009)   RDW  13.9 (09/29/2009)   PMN:  44.6 (09/29/2009)   Lymphs:  42.0 (09/29/2009)   Monos:  8.1 (09/29/2009)   Eosinophils:  4.5 (09/29/2009)   Basophil:  0.8 (09/29/2009)  Complete Metabolic Panel   Glucose:  132 (09/29/2009)   Sodium:  138 (09/29/2009)   Potassium:  4.5 (09/29/2009)   Chloride:  104 (09/29/2009)   CO2:  28 (09/29/2009)   BUN:  10 (09/29/2009)   Creatinine:  1.0 (09/29/2009)   Albumin:  3.8 (09/29/2009)   Total Protein:  7.2 (09/29/2009)   Calcium:  9.4 (09/29/2009)   Total Bili:  0.7 (09/29/2009)   Alk Phos:  50 (09/29/2009)   SGPT (ALT):  15 (09/29/2009)   SGOT (AST):  14 (09/29/2009)   Medications  Prior to Update: 1)  Glipizide 5 Mg Tabs (Glipizide) .Marland Kitchen.. 1 Tab Once Daily 2)  Zocor 40 Mg Tabs (Simvastatin) .... One Daily in The Evening 3)  Aspirin Ec 325 Mg Tbec (Aspirin) .... Take One Tablet By Mouth Daily 4)  Metformin Hcl 1000 Mg Tabs (Metformin Hcl) .Marland Kitchen.. 1 Tab Two Times A Day 5)  Coreg 6.25 Mg Tabs (Carvedilol) .... One Twice A Day 6)  Effient 10 Mg Tabs (Prasugrel Hcl) .... One Daily 7)  Ramipril 2.5 Mg Caps (Ramipril) .... One Twice A Day 8)  Nicotine 14 Mg/24hr Pt24 (Nicotine) .... Use One Daily As Directed  Current Medications (verified): 1)  Glipizide 5 Mg Tabs (Glipizide) .Marland Kitchen.. 1 Tab Once Daily 2)  Zocor 40 Mg Tabs (Simvastatin) .... One Daily in The Evening 3)  Aspirin Ec 325 Mg Tbec (Aspirin) .... Take One Tablet By Mouth Daily 4)  Coreg 6.25 Mg Tabs (Carvedilol) .... One Twice A Day 5)  Effient 10 Mg Tabs (Prasugrel Hcl) .... One Daily 6)  Ramipril 2.5 Mg Caps (Ramipril) .... One Twice A Day 7)  Nicotine 14 Mg/24hr Pt24 (Nicotine) .... Use One Daily As Directed 8)  Janumet 50-1000 Mg Tabs (Sitagliptin-Metformin Hcl) .... One By Mouth Two Times A Day  Allergies: 1)  ! Novocain  Past History:  Past Medical History: Last updated: 09/29/2009 1.  AAA: Followed by [facility], last measured 2.3 x 3.5 by ultrasound in 6/11. 2.  CAD: Inferior MI 7/11, hospitalized at [facility] in [place].  LHC: EF 50% with mild inferior hypokinesis, 70% calcified mid LAD, nearly occluded proximal RCA, 60% mid and distal CFX.  Patient had 3.0 x 28 and 3.0 x 18 Xience DES in RCA.  Echo (7/11): Mild LVH, EF 50% with inferior mild hypokinesis, RV normal 3.  Smoker 4.  Diabetes: Poorly controlled in the past 5.  History of back surgery, disabled from back pain.  Diabetes mellitus, type II "Crushed back" in 1983  Past Surgical History: Last updated: 09/29/2009 Lumbar laminectomy Lumbar fusion  Family History: Last updated: 09/21/2009 Father with MI at 3, Mother with MI at 63, sister and brother  both with MIs in 36s  Social History: Last updated: 09/29/2009 Lives with son in [place].  Disabled from back pain.  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  Smokes 6 cigs/day (more before).   Alcohol use-no Drug use-no Regular exercise-no  Risk Factors: Alcohol Use: 0 (10/13/2009) Exercise: no (  09/29/2009)  Risk Factors: Smoking Status: current (10/13/2009) Packs/Day: 1.5 (10/13/2009)  Family History: Reviewed history from 09/21/2009 and no changes required. Father with MI at 66, Mother with MI at 82, sister and brother both with MIs in 37s  Social History: Reviewed history from 09/29/2009 and no changes required. Lives with son in [place].  Disabled from back pain.  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  Smokes 6 cigs/day (more before).   Alcohol use-no Drug use-no Regular exercise-no  Review of Systems  The patient denies anorexia, fever, weight loss, weight gain, chest pain, syncope, dyspnea on exertion, peripheral edema, prolonged cough, abdominal pain, suspicious skin lesions, and enlarged lymph nodes.   Endo:  Denies cold intolerance, excessive hunger, excessive thirst, excessive urination, heat intolerance, polyuria, and weight change.  Physical Exam  General:  alert, well-developed, well-nourished, well-hydrated, appropriate dress, normal appearance, healthy-appearing, cooperative to examination, and good hygiene.   Mouth:  good dentition and pharynx pink and moist.   Neck:  supple, full ROM, no masses, no thyromegaly, no thyroid nodules or tenderness, no JVD, normal carotid upstroke, no carotid bruits, and no cervical lymphadenopathy.   Lungs:  normal respiratory effort, no intercostal retractions, no accessory muscle use, normal breath sounds, no dullness, no fremitus, no crackles, and no wheezes.   Heart:  normal rate, regular rhythm, no murmur, no gallop, no rub, and no JVD.   Abdomen:  soft, non-tender, normal bowel sounds, no distention, no masses, no guarding,  no rigidity, no rebound tenderness, no abdominal hernia, no inguinal hernia, no hepatomegaly, and no splenomegaly.   Msk:  kyphosis and scoliosis.   Pulses:  R and L carotid,radial,femoral,dorsalis pedis and posterior tibial pulses are full and equal bilaterally Extremities:  No clubbing, cyanosis, edema, or deformity noted with normal full range of motion of all joints.   Neurologic:  No cranial nerve deficits noted. Station and gait are normal. Plantar reflexes are down-going bilaterally. DTRs are symmetrical throughout. Sensory, motor and coordinative functions appear intact. Skin:  turgor normal, color normal, no rashes, no suspicious lesions, no ecchymoses, no petechiae, no purpura, no ulcerations, no edema, excessive tan, and solar damage.   Cervical Nodes:  no anterior cervical adenopathy and no posterior cervical adenopathy.   Psych:  Cognition and judgment appear intact. Alert and cooperative with normal attention span and concentration. No apparent delusions, illusions, hallucinations  Diabetes Management Exam:    Foot Exam (with socks and/or shoes not present):       Sensory-Pinprick/Light touch:          Left medial foot (L-4): normal          Left dorsal foot (L-5): normal          Left lateral foot (S-1): normal          Right medial foot (L-4): normal          Right dorsal foot (L-5): normal          Right lateral foot (S-1): normal       Sensory-Monofilament:          Left foot: normal          Right foot: normal       Inspection:          Left foot: normal          Right foot: normal       Nails:          Left foot: normal  Right foot: normal    Eye Exam:       Eye Exam done elsewhere          Date: 10/12/2009          Results: normal          Done by: [facility]   Impression & Recommendations:  Problem # 1:  TOBACCO USE (ICD-305.1) Assessment Unchanged  His updated medication list for this problem includes:    Nicotine 14 Mg/24hr Pt24 (Nicotine) ..... Use  one daily as directed  Encouraged smoking cessation and discussed different methods for smoking cessation.   Problem # 2:  DIABETES MELLITUS, TYPE II (ICD-250.00) Assessment: Deteriorated  The following medications were removed from the medication list:    Metformin Hcl 1000 Mg Tabs (Metformin hcl) .Marland Kitchen... 1 tab two times a day His updated medication list for this problem includes:    Glipizide 5 Mg Tabs (Glipizide) .Marland Kitchen... 1 tab once daily    Aspirin Ec 325 Mg Tbec (Aspirin) .Marland Kitchen... Take one tablet by mouth daily    Ramipril 2.5 Mg Caps (Ramipril) ..... One twice a day    Janumet 50-1000 Mg Tabs (Sitagliptin-metformin hcl) ..... One by mouth two times a day  Labs Reviewed: Creat: 1.0 (09/29/2009)     Last Eye Exam: normal (10/12/2009) Reviewed HgBA1c results: 8.4 (09/29/2009)  Problem # 3:  HYPERCHOLESTEROLEMIA (ICD-272.0) Assessment: Unchanged  His updated medication list for this problem includes:    Zocor 40 Mg Tabs (Simvastatin) ..... One daily in the evening  Labs Reviewed: SGOT: 14 (09/29/2009)   SGPT: 15 (09/29/2009)  Problem # 4:  ESSENTIAL HYPERTENSION (ICD-401.9) Assessment: Improved  His updated medication list for this problem includes:    Coreg 6.25 Mg Tabs (Carvedilol) ..... One twice a day    Ramipril 2.5 Mg Caps (Ramipril) ..... One twice a day  BP today: 100/60 Prior BP: 100/62 (09/29/2009)  Labs Reviewed: K+: 4.5 (09/29/2009) Creat: : 1.0 (09/29/2009)     Complete Medication List: 1)  Glipizide 5 Mg Tabs (Glipizide) .Marland Kitchen.. 1 tab once daily 2)  Zocor 40 Mg Tabs (Simvastatin) .... One daily in the evening 3)  Aspirin Ec 325 Mg Tbec (Aspirin) .... Take one tablet by mouth daily 4)  Coreg 6.25 Mg Tabs (Carvedilol) .... One twice a day 5)  Effient 10 Mg Tabs (Prasugrel hcl) .... One daily 6)  Ramipril 2.5 Mg Caps (Ramipril) .... One twice a day 7)  Nicotine 14 Mg/24hr Pt24 (Nicotine) .... Use one daily as directed 8)  Janumet 50-1000 Mg Tabs  (Sitagliptin-metformin hcl) .... One by mouth two times a day  Lipid Assessment/Plan:      Based on NCEP/ATP III, the patient's risk factor category is "history of coronary disease, peripheral vascular disease, cerebrovascular disease, or aortic aneurysm along with either diabetes, current smoker, or LDL > 130 plus HDL < 40 plus triglycerides > 200".  The patient's lipid goals are as follows: Total cholesterol goal is 200; LDL cholesterol goal is 70; HDL cholesterol goal is 40; Triglyceride goal is 150.     Patient Instructions: 1)  Please schedule a follow-up appointment in 3 months. 2)  Check your blood sugars regularly. If your readings are usually above 200 or below 70 you should contact our office. 3)  It is important that your Diabetic A1c level is checked every 3 months. 4)  Check your feet each night for sore areas, calluses or signs of infection. 5)  Check your Blood Pressure regularly. If it  is above 130/80: you should make an appointment. Prescriptions: JANUMET 50-1000 MG TABS (SITAGLIPTIN-METFORMIN HCL) One by mouth two times a day  #168 x 0   Entered and Authorized by:   Etta Grandchild MD   Signed by:   Etta Grandchild MD on 10/13/2009   Method used:   Samples Given   RxID:   [id number]   Current Allergies (reviewed today): ! NOVOCAIN

## 2010-03-07 NOTE — Progress Notes (Signed)
Summary: refill request  Phone Note Refill Request Message from:  Patient on December 09, 2009 1:17 PM  Refills Requested: Medication #1:  GLIPIZIDE 5 MG TABS 1 tab once daily [facility]   Method Requested: Telephone to Pharmacy Initial call taken by: Glynda Jaeger,  December 09, 2009 1:18 PM    Prescriptions: GLIPIZIDE 5 MG TABS (GLIPIZIDE) 1 tab once daily  #30 x 10   Entered by:   Ollen Gross, RN, BSN   Authorized by:   Marca Ancona, MD   Signed by:   Ollen Gross, RN, BSN on 12/09/2009   Method used:   Electronically to        [facility] (retail)       [address]       [place], [place]  [address]       Ph: [phone]       Fax: [phone]   RxID:   [id number]

## 2010-03-07 NOTE — Letter (Signed)
Summary: Lipid Letter  [facility]  [address] [place], [place] [address]   Phone: [phone]  Fax: [phone]    10/21/2009  [employer] [address] [place], [place]  [address]  Dear Greggory Stallion:  We have carefully reviewed your last lipid profile from 10/20/2009 and the results are noted below with a summary of recommendations for lipid management.    Cholesterol:       134     Goal: <200   HDL "good" Cholesterol:   57.84     Goal: >40   LDL "bad" Cholesterol:   74     Goal: <70   Triglycerides:       127.0     Goal: <150        TLC Diet (Therapeutic Lifestyle Change): Saturated Fats & Transfatty acids should be kept < 7% of total calories ***Reduce Saturated Fats Polyunstaurated Fat can be up to 10% of total calories Monounsaturated Fat Fat can be up to 20% of total calories Total Fat should be no greater than 25-35% of total calories Carbohydrates should be 50-60% of total calories Protein should be approximately 15% of total calories Fiber should be at least 20-30 grams a day ***Increased fiber may help lower LDL Total Cholesterol should be < 200mg /day Consider adding plant stanol/sterols to diet (example: Benacol spread) ***A higher intake of unsaturated fat may reduce Triglycerides and Increase HDL    Adjunctive Measures (may lower LIPIDS and reduce risk of Heart Attack) include: Aerobic Exercise (20-30 minutes 3-4 times a week) Limit Alcohol Consumption Weight Reduction Aspirin 75-81 mg a day by mouth (if not allergic or contraindicated) Dietary Fiber 20-30 grams a day by mouth     Current Medications: 1)    Glipizide 5 Mg Tabs (Glipizide) .Marland Kitchen.. 1 tab once daily 2)    Zocor 40 Mg Tabs (Simvastatin) .... One daily in the evening 3)    Aspirin Ec 325 Mg Tbec (Aspirin) .... Take one tablet by mouth daily 4)    Coreg 6.25 Mg Tabs (Carvedilol) .... One twice a day 5)    Effient 10 Mg Tabs (Prasugrel hcl) .... One daily 6)    Ramipril 2.5 Mg  Caps (Ramipril) .... One twice a day 7)    Nicotine 14 Mg/24hr Pt24 (Nicotine) .... Use one daily as directed 8)    Janumet 50-1000 Mg Tabs (Sitagliptin-metformin hcl) .... One by mouth two times a day  If you have any questions, please call. We appreciate being able to work with you.   Sincerely,    [facility] Etta Grandchild MD

## 2010-03-07 NOTE — Letter (Signed)
Summary: Results Follow-up Letter  [facility]  [address] [place], [place] [address]   Phone: [phone]  Fax: [phone]    09/29/2009  [address] [place], [place]  [address]  Dear Mr. DIRK,   The following are the results of your recent test(s):  Test     Result     Blood sugars   too high Liver/kidney   normal CBC       mild anemia Thyroid     normal   _________________________________________________________  Please call for an appointment soon _________________________________________________________ _________________________________________________________ _________________________________________________________  Sincerely,  Sanda Linger MD [facility]

## 2010-03-09 NOTE — Letter (Signed)
Summary: [facility]  [facility]   Imported By: [name] 01/26/2010 14:59:05  _____________________________________________________________________  External Attachment:    Type:   Image     Comment:   External Document

## 2010-03-09 NOTE — Progress Notes (Signed)
Summary: ZOCOR  Phone Note Refill Request Call back at Home Phone [phone] Message from:  Patient on March 03, 2010 10:27 AM  simvastatin 40 mg. [facility] [phone]   Method Requested: Fax to Local Pharmacy Initial call taken by: Lorne Skeens,  March 03, 2010 10:28 AM    Prescriptions: ZOCOR 40 MG TABS (SIMVASTATIN) one daily in the evening  #30 Each x 5   Entered by:   Caralee Ates CMA   Authorized by:   Marca Ancona, MD   Signed by:   Caralee Ates CMA on 03/03/2010   Method used:   Electronically to        [facility] (retail)       [address]       [place], [place]  [address]       Ph: [phone]       Fax: [phone]   RxID:   [id number]

## 2010-04-20 ENCOUNTER — Other Ambulatory Visit: Payer: Self-pay

## 2010-04-21 ENCOUNTER — Other Ambulatory Visit (INDEPENDENT_AMBULATORY_CARE_PROVIDER_SITE_OTHER): Payer: Medicare Other

## 2010-04-21 ENCOUNTER — Other Ambulatory Visit: Payer: Self-pay | Admitting: Cardiology

## 2010-04-21 ENCOUNTER — Encounter: Payer: Self-pay | Admitting: Cardiology

## 2010-04-21 DIAGNOSIS — I2510 Atherosclerotic heart disease of native coronary artery without angina pectoris: Secondary | ICD-10-CM

## 2010-04-21 DIAGNOSIS — E7800 Pure hypercholesterolemia, unspecified: Secondary | ICD-10-CM | POA: Insufficient documentation

## 2010-04-21 LAB — LIPID PANEL
Cholesterol: 191 mg/dL (ref 0–200)
HDL: 42.3 mg/dL (ref >39.00)

## 2010-04-21 LAB — HEPATIC FUNCTION PANEL
Albumin: 3.5 g/dL (ref 3.5–5.2)
Alkaline Phosphatase: 43 U/L (ref 39–117)
Bilirubin, Direct: 0.1 mg/dL (ref 0.0–0.3)
Total Bilirubin: 0.5 mg/dL (ref 0.3–1.2)

## 2010-06-20 NOTE — Procedures (Signed)
DUPLEX ULTRASOUND OF ABDOMINAL AORTA   INDICATION:  Followup abdominal aortic aneurysm.   HISTORY:  Diabetes:  Yes.  Cardiac:  No.  Hypertension:  No.  Smoking:  Yes.  Connective Tissue Disorder:  Family History:  Of AAA no.  Previous Surgery:  No.   DUPLEX EXAM:         AP (cm)                   TRANSVERSE (cm)  Proximal             2.4 cm                    2.6 cm  Mid                  3.3 cm                    3.2 cm  Distal               2.4 cm                    2.5 cm  Right Iliac          1.01 cm                   1.18 cm  Left Iliac           0.9 cm                    1.21 cm   PREVIOUS:  Date:  AP:  2.3  TRANSVERSE:  3.5   IMPRESSION:  Abdominal aortic aneurysm with the largest diameter of 3.3  x 3.2 cm.  Stable from previous exams.   ___________________________________________  Janetta Hora Fields, MD   NT/MEDQ  D:  07/27/2009  T:  07/27/2009  Job:  [id number]

## 2010-06-20 NOTE — Assessment & Plan Note (Signed)
OFFICE VISIT   CHRISHUN, SCHEER  DOB:  09-06-1945                                       07/05/2006  NUUVO#:[id number]   Mr. Rochin is a 65 year old male referred by Dr. Cleophas Dunker for evaluation  of abdominal aortic aneurysm.  He sustained severe back trauma in 1983  when a crane fell on him.  He had an operation on his back at that time.  He has been following intermittently for back pain since then.  Recent  CT scan showed a small abdominal aortic aneurysm of 3.5 cm in diameter.  He denies any back or abdominal pain currently other than his chronic  back pain.  His atherosclerotic risk factors include diabetes.  He  denies history of hypertension, elevated cholesterol or coronary artery  disease.  He apparently has been noncompliant with his diabetes  treatment in the past.   PAST SURGICAL HISTORY:  Remarkable for his back surgery as mentioned  above.   FAMILY HISTORY:  He has no family history of aneurysm.   SOCIAL HISTORY:  He is single, has five children.  Smokes a pack of  cigarettes per day.  Does not consume alcohol regularly.   REVIEW OF SYSTEMS:  He is 5 foot 10 and 172 pounds.  He has some dyspnea  with exertion.  He denies recent weight loss or gain.  He has occasional  wheezing and probably has some component of chronic obstructive  pulmonary disease.  He denies peptic ulcer disease.  He has some  occasional urinary frequency.  He has pain in his legs with walking.  He  has multiple joint arthritis pain, depression and some anxiety.  He does  not take any medications.   ALLERGIES:  HE IS ALLERGIC TO NOVOCAIN WHICH CAUSES ANXIETY.  He says  Xylocaine is okay however.   PHYSICAL EXAMINATION:  On physical exam blood pressure is 141/79.  Heart  rate is 77 and regular.  HEENT is unremarkable.  Neck has 2+ carotid  pulses without bruit.  He has a 2+ right radial and 1+ left radial  pulse.  He has 2+ carotid pulses bilaterally.  Chest is clear  to  auscultation.  Cardiac exam is regular rate and rhythm.  Abdomen is  soft, nontender, nondistended, no pulsatile mass.  He has 2+ femoral  pulses bilaterally.  On the right leg he has 1+ popliteal and 1+  dorsalis pedis pulse.  On the left leg he has a 1+ dorsalis pedis pulse.  There are no posterior tibial pulses palpable bilaterally.   I reviewed his CT scan of the abdomen and pelvis.  He has a small  abdominal aortic aneurysm which is 3.5 cm in diameter.  This arises just  above the aortic bifurcation.  There is mild dilatation of the left  common iliac artery at 14 mm and of the right at 10 mm.   In summary, Mr. Stonehocker has a small abdominal aortic aneurysm.  I  discussed with him today that we should continue to do surveillance  measurements of his aneurysm over time.  If it ever reaches 5.5 cm in  diameter, we may consider repair at that time.  I also discussed with  him the symptoms of ruptured aneurysm.  Since his aneurysm is quite  small at this time, he will follow up with an abdominal aortic  aneurysm  ultrasound and an office visit in one year's time.  I have also  encouraged him to start taking one aspirin daily for coronary and stroke  risk due to his multiple atherosclerotic risk factors.   Janetta Hora. Fields, MD  Electronically Signed   CEF/MEDQ  D:  07/05/2006  T:  07/08/2006  Job:  [id number]   cc:   Claude Manges. Cleophas Dunker, M.D.

## 2010-06-20 NOTE — Procedures (Signed)
DUPLEX ULTRASOUND OF ABDOMINAL AORTA   INDICATION:  Follow up abdominal aortic aneurysm.   HISTORY:  Diabetes:  Yes  Cardiac:  MI  Hypertension:  Yes  Smoking:  Yes  Connective Tissue Disorder:  Family History:  Previous Surgery:   DUPLEX EXAM:         AP (cm)                   TRANSVERSE (cm)  Proximal             2.36 cm                   2.43 cm  Mid                  3.3 cm                    3.55 cm  Distal               2.41 cm                   2.38 cm  Right Iliac          1.06 cm                   0.97 cm  Left Iliac           1.45 cm                   1.39 cm   PREVIOUS:  Date:  AP:  3.3  TRANSVERSE:  3.3   IMPRESSION:  Abdominal aortic aneurysm noted with the largest  measurement of 3.38 cm x 3.55 cm.   ___________________________________________  Janetta Hora. Fields, MD   MG/MEDQ  D:  08/04/2008  T:  08/05/2008  Job:  [id number]

## 2010-06-20 NOTE — Procedures (Signed)
DUPLEX ULTRASOUND OF ABDOMINAL AORTA   INDICATION:  Followup evaluation of known abdominal aortic aneurysm.   HISTORY:  Diabetes:  Non-insulin-dependent.  Cardiac:  History of MI.  Hypertension:  Yes.  Smoking:  Pack per day.  Connective Tissue Disorder:  Family History:  Previous Surgery:   DUPLEX EXAM:         AP (cm)                   TRANSVERSE (cm)  Proximal             2.7 cm                    3.0 cm  Mid                  3.3 cm                    3.3 cm  Distal               2.3 cm                    2.4 cm  Right Iliac          0.7 cm                    0.7 cm  Left Iliac           1.2 cm                    1.5 cm   PREVIOUS:  Date:  06/2006  AP:  3.5  TRANSVERSE:   IMPRESSION:  Abdominal aortic aneurysm measurements are stable from  previous study.   ___________________________________________  Janetta Hora Fields, MD   MC/MEDQ  D:  07/30/2007  T:  07/30/2007  Job:  [id number]

## 2010-06-20 NOTE — Assessment & Plan Note (Signed)
OFFICE VISIT   SHAFTER, JUPIN  DOB:  14-Feb-1945                                       07/30/2007  [id number]#:[id number]   The patient is a 65 year old male who we have been following for a small  abdominal aortic aneurysm.  He was last seen in May of 2008.  He has  some chronic back pain but overall has been well.  He denies any new  abdominal or back pain.  He continues to smoke one pack of cigarettes  per day.   PHYSICAL EXAMINATION:  On physical exam today blood pressure is 107/69,  pulse is 84 and regular.  Abdomen is soft, nontender, nondistended with  no palpable masses.  He has 2+ femoral pulses with absent popliteal and  pedal pulses bilaterally.  Feet are pink, warm and well-perfused.   He had a repeat abdominal aortic ultrasound today which showed the  aneurysm was 3.3 cm in diameter.  This is compared to 3.5 cm in diameter  previously.  Overall, the patient is doing well.  The aneurysm is still  quite small and I do not believe that it needs any intervention at this  point.  He will return for followup scan in one year's time.   Janetta Hora. Fields, MD  Electronically Signed   CEF/MEDQ  D:  07/30/2007  T:  07/31/2007  Job:  [id number]   cc:   Claude Manges. Cleophas Dunker, M.D.

## 2010-06-27 ENCOUNTER — Encounter: Payer: Self-pay | Admitting: Vascular Surgery

## 2010-08-03 ENCOUNTER — Encounter: Payer: Self-pay | Admitting: Vascular Surgery

## 2010-08-03 ENCOUNTER — Encounter (INDEPENDENT_AMBULATORY_CARE_PROVIDER_SITE_OTHER): Payer: Medicare Other

## 2010-08-03 ENCOUNTER — Ambulatory Visit (INDEPENDENT_AMBULATORY_CARE_PROVIDER_SITE_OTHER): Payer: Medicare Other | Admitting: Vascular Surgery

## 2010-08-03 VITALS — BP 143/82 | HR 73 | Resp 20

## 2010-08-03 DIAGNOSIS — I714 Abdominal aortic aneurysm, without rupture: Secondary | ICD-10-CM

## 2010-08-03 DIAGNOSIS — I739 Peripheral vascular disease, unspecified: Secondary | ICD-10-CM

## 2010-08-03 NOTE — Progress Notes (Addendum)
Patient is a 65 year old male who returns today for followup of a small abdominal aortic aneurysm. The patient was last seen in June of 2009 at which time his aneurysm was 3.3 cm in diameter. He returns today for further followup. He denies any abdominal or back pain. He does have chronic back pain this is at no acute changes. He continues to smoke approximately 1/2 to one pack of cigarettes daily.  He denies chest pain or shortness of breath on review of systems.  Physical exam blood pressure is 143/82 in the left arm heart rate is 73 and regular respirations 20  Chest clear to auscultation  Cardiac regular rate and rhythm  Abdomen soft nontender nondistended no mass  He had an ultrasound of the abdomen today which shows the aneurysm is 3.3 cm in diameter and unchanged from June of 2011.  Assessment: Small abdominal aortic aneurysm with no significant change  Plan follow up aortic ultrasound lab only appointment in one year

## 2010-08-03 NOTE — Progress Notes (Signed)
1 yr f/u AAA/US done today

## 2010-08-03 NOTE — Progress Notes (Deleted)
Subjective:     Patient ID: Arthur Evans, male   DOB: 08-05-1945, 65 y.o.   MRN: [id number]  HPI   Review of Systems     Objective:   Physical Exam     Assessment:     ***    Plan:     ***

## 2010-08-07 NOTE — Procedures (Unsigned)
DUPLEX ULTRASOUND OF ABDOMINAL AORTA  INDICATION:  Follow up AAA.  HISTORY: Diabetes:  Yes Cardiac:  MI July 2010 Hypertension:  No Smoking:  Yes Connective Tissue Disorder: Family History:  No AAA history Previous Surgery:  No  DUPLEX EXAM:         AP (cm)                   TRANSVERSE (cm) Proximal             2.98 cm                   2.95 cm Mid                  3.31 cm                   3.27 cm Distal               2.57 cm                   2.61 cm Right Iliac          1.13 cm                   1.11 cm Left Iliac           1.01 cm                   1.11 cm  PREVIOUS:  Date: 07/27/2009  AP:  3.3  TRANSVERSE:  3.2  IMPRESSION: 1. Abdominal aortic aneurysm noted with the largest measurement noted     today of 3.31 cm x 3.27 cm. 2. Stable from previous exam.  ___________________________________________ Janetta Hora. Fields, MD  EM/MEDQ  D:  08/03/2010  T:  08/03/2010  Job:  [id number]

## 2010-10-10 ENCOUNTER — Other Ambulatory Visit: Payer: Self-pay | Admitting: Cardiology

## 2010-11-08 ENCOUNTER — Other Ambulatory Visit: Payer: Self-pay | Admitting: Cardiology

## 2010-11-08 NOTE — Telephone Encounter (Signed)
Pt usually gets 60, but last time got 30, needs a new prescription called for 60

## 2010-12-07 LAB — HM DIABETES EYE EXAM: HM Diabetic Eye Exam: NORMAL

## 2011-01-17 ENCOUNTER — Other Ambulatory Visit: Payer: Self-pay | Admitting: Cardiology

## 2011-03-16 ENCOUNTER — Telehealth: Payer: Self-pay

## 2011-03-16 NOTE — Telephone Encounter (Signed)
Pt. Called office on 03/15/11 with c/o a "pinching pain in left side that comes and goes".  Stated it has occurred x2 in past 10 days, and describes the episode as feeling the pinching intermittently over the course of an hour, and then subsides.  Is questioning if he needs to have aneurysm checked.  Denies nausea/vomiting.  Questioned about back pain, and states "I have back pain all the time", and that is not new in conjunction with this left side pain.  Discussed w/ Dr. Darrick Penna.  Pt. Is sched. for annual abdominal ultrasound in June 2013, and per v.o.Dr. Darrick Penna, schedule the ultrasound now.

## 2011-03-22 ENCOUNTER — Encounter (INDEPENDENT_AMBULATORY_CARE_PROVIDER_SITE_OTHER): Payer: Medicare Other | Admitting: Vascular Surgery

## 2011-03-22 ENCOUNTER — Other Ambulatory Visit: Payer: Self-pay

## 2011-03-22 DIAGNOSIS — I714 Abdominal aortic aneurysm, without rupture: Secondary | ICD-10-CM

## 2011-03-22 NOTE — Progress Notes (Unsigned)
AAA performed @ [facility] 03/22/2011

## 2011-03-30 ENCOUNTER — Encounter: Payer: Self-pay | Admitting: Vascular Surgery

## 2011-03-30 NOTE — Procedures (Unsigned)
DUPLEX ULTRASOUND OF ABDOMINAL AORTA  INDICATION:  Abdominal aortic aneurysm.  HISTORY: Diabetes:  Yes Cardiac:  MI Hypertension:  No Smoking:  Currently Family History:  No Previous Surgery:  No  DUPLEX EXAM:         AP (cm)                   TRANSVERSE (cm) Proximal             2.65 cm                   2.73 cm Mid                  3.35 cm                   3.46 cm Distal               2.33 cm                   2.43 cm Right Iliac          0.99 cm                   1.13 cm Left Iliac           1.42 cm                   1.64 cm  PREVIOUS:  Date: 08/03/2010  AP:  3.3  TRANSVERSE:  3.2  IMPRESSION: 1. Infrarenal abdominal aortic aneurysm present measuring 3.35 cm x     3.46 cm. 2. Mild dilatation present involving the left common iliac artery     measuring 1.42 x 1.64 cm. 3. Elevated velocity present involving the left internal iliac artery     suggestive of stenosis with peak systolic velocity of 264 cm/sec. 4. Stable diameters since previous study on 08/03/2010.  ___________________________________________ Janetta Hora. Fields, MD  SH/MEDQ  D:  03/22/2011  T:  03/22/2011  Job:  [id number]

## 2011-04-09 ENCOUNTER — Other Ambulatory Visit: Payer: Self-pay | Admitting: Cardiology

## 2011-04-10 ENCOUNTER — Other Ambulatory Visit: Payer: Self-pay

## 2011-04-10 MED ORDER — RAMIPRIL 2.5 MG PO CAPS: 2.5000 mg | ORAL_CAPSULE | Freq: Two times a day (BID) | ORAL | Status: DC

## 2011-04-10 MED ORDER — CARVEDILOL 6.25 MG PO TABS: 6.2500 mg | ORAL_TABLET | Freq: Two times a day (BID) | ORAL | Status: DC

## 2011-04-10 MED ORDER — GLIPIZIDE 5 MG PO TABS: 5.0000 mg | ORAL_TABLET | Freq: Every day | ORAL | Status: DC

## 2011-08-01 ENCOUNTER — Encounter: Payer: Self-pay | Admitting: Neurosurgery

## 2011-08-01 ENCOUNTER — Encounter: Payer: Self-pay | Admitting: Vascular Surgery

## 2011-08-02 ENCOUNTER — Encounter: Payer: Self-pay | Admitting: Neurosurgery

## 2011-08-02 ENCOUNTER — Ambulatory Visit (INDEPENDENT_AMBULATORY_CARE_PROVIDER_SITE_OTHER): Payer: Medicare Other | Admitting: Neurosurgery

## 2011-08-02 ENCOUNTER — Ambulatory Visit (INDEPENDENT_AMBULATORY_CARE_PROVIDER_SITE_OTHER): Payer: Medicare Other | Admitting: *Deleted

## 2011-08-02 VITALS — BP 114/69 | HR 75 | Resp 14 | Ht 68.5 in | Wt 151.2 lb

## 2011-08-02 DIAGNOSIS — I714 Abdominal aortic aneurysm, without rupture: Secondary | ICD-10-CM

## 2011-08-02 NOTE — Progress Notes (Signed)
[facility] AAA/PAD/PVD Office Note  CC: Annual AAA duplex Referring Physician: Fields  History of Present Illness: 66 year old male patient of Dr. Darrick Penna who is followed for known AAA. The patient reports no unusual abdominal or back pain. Patient states she's had his back broken in the past and has chronic back pain. The patient reports no new medical diagnoses or recent surgeries.  Past Medical History  Diagnosis Date  . AAA (abdominal aortic aneurysm)   . History of trauma 75    Neil Crouch fell on him causing severe back injury  . Diabetes mellitus   . Arthritis   . Anxiety   . Depression   . Myocardial infarction July 2010    ROS: [x]  Positive   [ ]  Denies    General: [ ]  Weight loss, [ ]  Fever, [ ]  chills Neurologic: [ ]  Dizziness, [ ]  Blackouts, [ ]  Seizure [ ]  Stroke, [ ]  "Mini stroke", [ ]  Slurred speech, [ ]  Temporary blindness; [ ]  weakness in arms or legs, [ ]  Hoarseness Cardiac: [ ]  Chest pain/pressure, [ ]  Shortness of breath at rest [ ]  Shortness of breath with exertion, [ ]  Atrial fibrillation or irregular heartbeat Vascular: [ ]  Pain in legs with walking, [ ]  Pain in legs at rest, [ ]  Pain in legs at night,  [ ]  Non-healing ulcer, [ ]  Blood clot in vein/DVT,   Pulmonary: [ ]  Home oxygen, [ ]  Productive cough, [ ]  Coughing up blood, [ ]  Asthma,  [ ]  Wheezing Musculoskeletal:  [ ]  Arthritis, [ ]  Low back pain, [ ]  Joint pain Hematologic: [ ]  Easy Bruising, [ ]  Anemia; [ ]  Hepatitis Gastrointestinal: [ ]  Blood in stool, [ ]  Gastroesophageal Reflux/heartburn, [ ]  Trouble swallowing Urinary: [ ]  chronic Kidney disease, [ ]  on HD - [ ]  MWF or [ ]  TTHS, [ ]  Burning with urination, [ ]  Difficulty urinating Skin: [ ]  Rashes, [ ]  Wounds Psychological: [ ]  Anxiety, [ ]  Depression   Social History History  Substance Use Topics  . Smoking status: Current Everyday Smoker -- 0.5 packs/day for 45 years    Types: Cigarettes  . Smokeless tobacco:  Never Used   Comment: trying to quit; has decreased from 2 ppd to 1/2  ppd  . Alcohol Use: No    Family History History reviewed. No pertinent family history.  Allergies  Allergen Reactions  . Novocain (Procaine Hcl) Anxiety    nervousness    Current Outpatient Prescriptions  Medication Sig Dispense Refill  . aspirin 81 MG tablet Take 81 mg by mouth 2 (two) times daily.        . carvedilol (COREG) 6.25 MG tablet Take 1 tablet (6.25 mg total) by mouth 2 (two) times daily with a meal.  60 tablet  2  . EFFIENT 10 MG TABS TAKE ONE TABLET BY MOUTH EVERY DAY  30 each  10  . glipiZIDE (GLUCOTROL) 5 MG tablet Take 1 tablet (5 mg total) by mouth daily.  30 tablet  2  . ramipril (ALTACE) 2.5 MG capsule Take 1 capsule (2.5 mg total) by mouth 2 (two) times daily.  60 capsule  2  . simvastatin (ZOCOR) 40 MG tablet TAKE ONE TABLET BY MOUTH EVERY DAY IN THE EVENING  30 tablet  5    Physical Examination  Filed Vitals:   08/02/11 0906  BP: 114/69  Pulse: 75  Resp: 14    Body mass index is 22.66 kg/(m^2).  General:  WDWN in NAD Gait: Normal HEENT: WNL Eyes: Pupils equal Pulmonary: normal non-labored breathing , without Rales, rhonchi,  wheezing Cardiac: RRR, without  Murmurs, rubs or gallops; No carotid bruits Abdomen: soft, NT, no masses Skin: no rashes, ulcers noted Vascular Exam/Pulses: 2+ radial pulses bilaterally, carotid bruits are not heard, no abdominal mass is palpated  Extremities without ischemic changes, no Gangrene , no cellulitis; no open wounds;  Musculoskeletal: no muscle wasting or atrophy  Neurologic: A&O X 3; Appropriate Affect ; SENSATION: normal; MOTOR FUNCTION:  moving all extremities equally. Speech is fluent/normal  Non-Invasive Vascular Imaging: AAA sac size is 3.52 x 3.58 which is a very slight increase from previous exam  ASSESSMENT/PLAN: Asymptomatic AAA patient that will followup in one year with repeat AAA duplex. His questions were encouraged and  answered, he is in agreement with this plan.  Lauree Chandler ANP  Clinic M.D.: Fields

## 2011-08-03 ENCOUNTER — Other Ambulatory Visit: Payer: Medicare Other

## 2011-08-04 ENCOUNTER — Other Ambulatory Visit: Payer: Self-pay | Admitting: Cardiology

## 2011-08-16 NOTE — Procedures (Unsigned)
DUPLEX ULTRASOUND OF ABDOMINAL AORTA  INDICATION:  Follow up AAA.  HISTORY: Diabetes:  Yes. Cardiac:  MI July 2010. Hypertension:  No. Smoking:  Yes. Connective Tissue Disorder: Family History:  No. Previous Surgery:  No.  DUPLEX EXAM:         AP (cm)                   TRANSVERSE (cm) Proximal             3.22 cm                   3.27 cm Mid                  3.52 cm                   3.58 cm Distal               2.27 cm                   2.33 cm Right Iliac          0.92 cm Left Iliac           1.32 cm                   2.32 cm  PREVIOUS:  Date: 08/03/2010  AP:  3.31  TRANSVERSE:  3.27  IMPRESSION: 1. Abdominal aortic aneurysm noted with largest measurement taken     today of 3.52 x 3.58 cm. 2. This is a slight increase in comparison to the last exam.  ___________________________________________ Janetta Hora. Fields, MD  EM/MEDQ  D:  08/03/2011  T:  08/03/2011  Job:  [id number]

## 2011-09-08 ENCOUNTER — Other Ambulatory Visit: Payer: Self-pay | Admitting: Cardiology

## 2011-10-15 ENCOUNTER — Other Ambulatory Visit: Payer: Self-pay | Admitting: Cardiology

## 2011-10-27 ENCOUNTER — Encounter (HOSPITAL_COMMUNITY): Payer: Self-pay | Admitting: Emergency Medicine

## 2011-10-27 ENCOUNTER — Emergency Department (HOSPITAL_COMMUNITY)
Admission: EM | Admit: 2011-10-27 | Discharge: 2011-10-27 | Disposition: A | Discharge status: Home / Self Care | Payer: Medicare Other | Source: Home / Self Care

## 2011-10-27 DIAGNOSIS — L02519 Cutaneous abscess of unspecified hand: Secondary | ICD-10-CM

## 2011-10-27 DIAGNOSIS — L03011 Cellulitis of right finger: Secondary | ICD-10-CM

## 2011-10-27 DIAGNOSIS — E119 Type 2 diabetes mellitus without complications: Secondary | ICD-10-CM

## 2011-10-27 MED ORDER — CEPHALEXIN 500 MG PO CAPS: 500.0000 mg | ORAL_CAPSULE | Freq: Three times a day (TID) | ORAL | Status: DC

## 2011-10-27 MED ORDER — GLIPIZIDE 5 MG PO TABS: 5.0000 mg | ORAL_TABLET | Freq: Every day | ORAL | Status: DC

## 2011-10-27 NOTE — ED Provider Notes (Signed)
History     CSN: [id number]  Arrival date & time 10/27/11  1802   None     Chief Complaint  Patient presents with  . Hand Injury    need med refill and injury to right hand middle finger    (Consider location/radiation/quality/duration/timing/severity/associated sxs/prior treatment) HPI Comments:  This 66 year old patient presents with 2 complaints.   1. 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 Specifically glipizide. He ran out of medicine just a few days ago and was told to come to the urgent care to get a few until he sees his doctor. Denies any symptoms associated with his diabetes.  2. His second concern is that of his right middle finger. He smashed it under a car hood approximately 2 weeks ago. Since then he's been bumping it against different objects in created abrasions to the distal aspect. He developed redness of the distal phalanx swelling pain tenderness and filled up with pus; he drained it himself 2 days ago and the finger has been improving.there is no longer redness however there is mild bluish discoloration and swelling. He states it is much better and now no longer tender   Past Medical History  Diagnosis Date  . AAA (abdominal aortic aneurysm)   . History of trauma 58    Neil Crouch fell on him causing severe back injury  . Diabetes mellitus   . Arthritis   . Anxiety   . Depression   . Myocardial infarction July 2010    Past Surgical History  Procedure Date  . Back surgery 1983  . Vasectomy   . Cardiac catheterization     cardiac stents    History reviewed. No pertinent family history.  History  Substance Use Topics  . Smoking status: Current Every Day Smoker -- 0.5 packs/day for 45 years    Types: Cigarettes  . Smokeless tobacco: Never Used   Comment: trying to quit; has decreased from 2 ppd to 1/2  ppd  . Alcohol Use: No      Review of Systems  Constitutional: Negative.   Respiratory: Negative.   Cardiovascular: Negative.     Gastrointestinal: Negative.   Musculoskeletal:       See history of present illness  Neurological: Negative.     Allergies  Novocain  Home Medications   Current Outpatient Rx  Name Route Sig Dispense Refill  . ASPIRIN 81 MG PO TABS Oral Take 81 mg by mouth 2 (two) times daily.      Marland Kitchen CARVEDILOL 6.25 MG PO TABS Oral Take 1 tablet (6.25 mg total) by mouth 2 (two) times daily with a meal. 60 tablet 2    Patient needs to make appointment.  Marland Kitchen EFFIENT 10 MG PO TABS  TAKE ONE TABLET BY MOUTH EVERY DAY 15 each 0    Must make appt for further refills. Has not been s ...  . GLIPIZIDE 5 MG PO TABS Oral Take 1 tablet (5 mg total) by mouth daily. 30 tablet 2    Patient needs appointment.  Marland Kitchen RAMIPRIL 2.5 MG PO CAPS Oral Take 1 capsule (2.5 mg total) by mouth 2 (two) times daily. 60 capsule 2    Patient needs to make appointment  . SIMVASTATIN 40 MG PO TABS  TAKE ONE TABLET BY MOUTH EVERY DAY IN THE EVENING 30 tablet 5  . CEPHALEXIN 500 MG PO CAPS Oral Take 1 capsule (500 mg total) by mouth 3 (three) times daily. 21 capsule 0  . GLIPIZIDE 5  MG PO TABS Oral Take 1 tablet (5 mg total) by mouth daily. 30 tablet 0    BP 143/82  Pulse 88  Temp 98.2 F (36.8 C) (Oral)  Resp 18  SpO2 97%  Physical Exam  Constitutional: He is oriented to person, place, and time. He appears well-developed and well-nourished. No distress.  Eyes: EOM are normal. Pupils are equal, round, and reactive to light.  Neck: Normal range of motion. Neck supple.  Musculoskeletal: Normal range of motion. He exhibits no tenderness.       Right long middle finger with minor swelling and slight bluish discoloration of the distal phalanx. No redness no drainage or fluctuants, no tenderness.  Neurological: He is alert and oriented to person, place, and time.  Skin: Skin is warm and dry.    ED Course  Procedures (including critical care time)  Labs Reviewed - No data to display No results found.   1. T2DM (type 2 diabetes  mellitus)   2. Cellulitis of finger of right hand       MDM  Glipizide 5mg  q d Resolving cellulitis of the R middle finger.  Keep appt with Dr. August Saucer in 2 weeks.  He was advised of the finger fail to continue to improve he should come back for antibiotics. Having said that he said he would like to have a prescription L. And he'll get a feel the case his finger does not continue to improve. I prescribed Keflex 500 mg 3 times a day for 7 days in case his finger becomes infected again        Hayden Rasmussen, NP 10/27/11 1949

## 2011-10-27 NOTE — ED Notes (Signed)
Pt needs refill on DM medication gilipizide.  Pt c/o of pain right hand finger injury smashed finger in truck of car. Lancet puss with straight pin. Pt ? If broken, end of finger is bent. Request tetanus shot.

## 2011-10-28 NOTE — ED Provider Notes (Signed)
Medical screening examination/treatment/procedure(s) were performed by non-physician practitioner and as supervising physician I was immediately available for consultation/collaboration.  Leslee Home, M.D.   Reuben Likes, MD 10/28/11 [phone]

## 2011-10-29 ENCOUNTER — Telehealth: Payer: Self-pay | Admitting: Cardiology

## 2011-10-29 NOTE — Telephone Encounter (Signed)
New Problem:    Patient called in wanting to know if we had any samples of EFFIENT 10 MG TABS avilable to hold him over until his appointment on 11/08/11.  Please call back.

## 2011-10-29 NOTE — Telephone Encounter (Signed)
Patient called was told effient 10 mg samples left at 3rd floor front desk. 

## 2011-11-08 ENCOUNTER — Encounter: Payer: Self-pay | Admitting: Cardiology

## 2011-11-08 ENCOUNTER — Other Ambulatory Visit: Payer: Self-pay | Admitting: Cardiology

## 2011-11-08 ENCOUNTER — Ambulatory Visit (INDEPENDENT_AMBULATORY_CARE_PROVIDER_SITE_OTHER): Payer: Medicare Other | Admitting: Cardiology

## 2011-11-08 VITALS — BP 138/78 | HR 88 | Ht 69.0 in | Wt 150.0 lb

## 2011-11-08 DIAGNOSIS — I714 Abdominal aortic aneurysm, without rupture, unspecified: Secondary | ICD-10-CM

## 2011-11-08 DIAGNOSIS — E7800 Pure hypercholesterolemia, unspecified: Secondary | ICD-10-CM

## 2011-11-08 DIAGNOSIS — I2510 Atherosclerotic heart disease of native coronary artery without angina pectoris: Secondary | ICD-10-CM

## 2011-11-08 DIAGNOSIS — F17200 Nicotine dependence, unspecified, uncomplicated: Secondary | ICD-10-CM

## 2011-11-08 LAB — CBC WITH DIFFERENTIAL/PLATELET
Basophils Absolute: 0 10*3/uL (ref 0.0–0.1)
Eosinophils Absolute: 0.2 10*3/uL (ref 0.0–0.7)
HCT: 43.2 % (ref 39.0–52.0)
Lymphs Abs: 2.1 10*3/uL (ref 0.7–4.0)
MCHC: 33.2 g/dL (ref 30.0–36.0)
MCV: 97.9 fl (ref 78.0–100.0)
Monocytes Absolute: 0.4 10*3/uL (ref 0.1–1.0)
Platelets: 191 10*3/uL (ref 150.0–400.0)
RDW: 13.7 % (ref 11.5–14.6)

## 2011-11-08 LAB — LIPID PANEL
Cholesterol: 261 mg/dL — ABNORMAL HIGH (ref 0–200)
HDL: 40.7 mg/dL (ref >39.00)
Total CHOL/HDL Ratio: 6
Triglycerides: 165 mg/dL — ABNORMAL HIGH (ref 0.0–149.0)

## 2011-11-08 LAB — BASIC METABOLIC PANEL
BUN: 18 mg/dL (ref 6–23)
CO2: 27 mEq/L (ref 19–32)
GFR: 84.9 mL/min (ref >60.00)
Glucose, Bld: 180 mg/dL — ABNORMAL HIGH (ref 70–99)
Potassium: 4.4 mEq/L (ref 3.5–5.1)

## 2011-11-08 LAB — HEPATIC FUNCTION PANEL
ALT: 15 U/L (ref 0–53)
Bilirubin, Direct: 0.1 mg/dL (ref 0.0–0.3)
Total Bilirubin: 0.7 mg/dL (ref 0.3–1.2)

## 2011-11-08 LAB — TSH: TSH: 1.51 u[IU]/mL (ref 0.35–5.50)

## 2011-11-08 MED ORDER — BUPROPION HCL ER (SR) 150 MG PO TB12: ORAL_TABLET | ORAL | Status: DC

## 2011-11-08 NOTE — Patient Instructions (Addendum)
Your physician wants you to follow-up in: 6 MONTHS WITH DR [facility] You will receive a reminder letter in the mail two months in advance. If you don't receive a letter, please call our office to schedule the follow-up appointment.  START WELLBUTRIN 150 MG TAKE ONE TABLET DAILY X 3 DAYS THEN INCREASE TO ONE TABLET TWICE DAILY  STOP EFFIENT AFTER CURRENT PRESCRIPTION COMPLETE  Your physician recommends that you HAVE LAB WORK TODAY

## 2011-11-08 NOTE — Progress Notes (Signed)
Patient ID: Arthur Evans, male   DOB: November 28, 1945, 66 y.o.   MRN: [id number] PCP: Dr. Yetta Barre  66 yo with history of diabetes and smoking presents for cardiology followup. He was in [place] in 7/11 when he developed severe chest pain radiating to his neck and was admitted to [facility] with inferior MI. He had a subtotal RCA occlusion and had 2 Xience DES to the RCA. EF was 50% by LV-gram and echo. Given residual moderate coronary disease in the left system, patient had Lexiscan myoview to assess for ischemia. This showed a fixed basal to mid inferior defect with no ishemia. Repeat echo showed EF 55-60%.   Since last appointment, patient is doing well. He is taking all his meds. He still smokes 1/2 ppd.  He denies exertional dyspnea or chest pain with exertion. 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  Main limitation comes from chronic back pain.    ECG: NSR, normal  Labs (7/11): creatinine 0.87, TnI 8.89  Labs (9/11): LDL 74, HDL 34, LFTs normal  Labs (3/12): LDL 121  Allergies:  1) ! Novocain   Past Medical History:  1. AAA: Followed by [facility], last measured 3.5 x 3.6 by [place] in 6/13.   2. CAD: Inferior MI 7/11, hospitalized at [facility] in [place]. LHC: EF 50% with mild inferior hypokinesis, 70% calcified mid LAD, nearly occluded proximal RCA, 60% mid and distal CFX. Patient had 3.0 x 28 and 3.0 x 18 Xience DES in RCA. Echo (7/11): Mild LVH, EF 50% with inferior mild hypokinesis, RV normal. Lexsican myoview (8/11) done due to residual moderate disease in the left system showed EF 59% with old basal to mid inferior MI and no ischemia. Repeat echo (9/11) showed EF 55-60%, basal inferior hypokinesis, mild biatrial enlargement.  3. Smoker  4. Diabetes: Poorly controlled in the past  5. History of back surgery, disabled from back pain.  6. Back problems  7. Hyperlipidemia   Family History:  Father with MI at 33, Mother with MI at 94, sister and brother both with MIs in 36s     Social History:  Lives with son in [place]. Disabled from back pain. 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 Smokes 1/2 ppd. Alcohol use-no  Drug use-no   ROS: All systems reviewed and negative except as per HPI.   Current Outpatient Prescriptions  Medication Sig Dispense Refill  . aspirin 81 MG tablet Take 81 mg by mouth 2 (two) times daily.        Marland Kitchen EFFIENT 10 MG TABS TAKE ONE TABLET BY MOUTH EVERY DAY  15 each  0  . glipiZIDE (GLUCOTROL) 5 MG tablet Take 1 tablet (5 mg total) by mouth daily.  30 tablet  2  . DISCONTD: carvedilol (COREG) 6.25 MG tablet Take 1 tablet (6.25 mg total) by mouth 2 (two) times daily with a meal.  60 tablet  2  . DISCONTD: ramipril (ALTACE) 2.5 MG capsule Take 1 capsule (2.5 mg total) by mouth 2 (two) times daily.  60 capsule  2  . DISCONTD: simvastatin (ZOCOR) 40 MG tablet TAKE ONE TABLET BY MOUTH EVERY DAY IN THE EVENING  30 tablet  5  . buPROPion (WELLBUTRIN SR) 150 MG 12 hr tablet ONE TABLET ONCE DAILY X 3 DAYS THEN ONE TABLET TWICE DAILY  33 tablet  12  . carvedilol (COREG) 6.25 MG tablet TAKE ONE TABLET BY MOUTH TWICE DAILY WITH MEALS  60 tablet  1  .  ramipril (ALTACE) 2.5 MG capsule TAKE ONE CAPSULE BY MOUTH TWICE DAILY  60 capsule  1  . simvastatin (ZOCOR) 40 MG tablet TAKE ONE TABLET BY MOUTH IN THE EVENING  30 tablet  4  . DISCONTD: glipiZIDE (GLUCOTROL) 5 MG tablet Take 1 tablet (5 mg total) by mouth daily.  30 tablet  0   BP 138/78  Pulse 88  Ht 5\' 9"  (1.753 m)  Wt 150 lb (68.04 kg)  BMI 22.15 kg/m2 General: NAD Neck: No JVD, no thyromegaly or thyroid nodule.  Lungs: Clear to auscultation bilaterally with normal respiratory effort. CV: Nondisplaced PMI.  Heart regular S1/S2, no S3/S4, no murmur.  No peripheral edema.  No carotid bruit.  Normal pedal pulses.  Abdomen: Soft, nontender, no hepatosplenomegaly, no distention.  Neurologic: Alert and oriented x 3.  Psych: Normal affect. Extremities: No clubbing or cyanosis.    Assessment/Plan: 1. CAD: Stable with no ischemic symptoms.  He can stop Effient when his current bottle runs out.  Continue ASA 81 mg daily, statin, ramipril, Coreg.  2. Smoking: I strongly encouraged him to quit.  I am going to give him a Wellbutrin prescription.  3. Hyperlipidemia: Check lipids/LFTs today, goal LDL< 70.  4. AAA: Followed yearly at [facility].   Marca Ancona 11/08/2011

## 2011-11-20 ENCOUNTER — Other Ambulatory Visit: Payer: Self-pay | Admitting: *Deleted

## 2011-11-20 DIAGNOSIS — E7800 Pure hypercholesterolemia, unspecified: Secondary | ICD-10-CM

## 2011-11-26 ENCOUNTER — Other Ambulatory Visit (INDEPENDENT_AMBULATORY_CARE_PROVIDER_SITE_OTHER): Payer: Medicare Other

## 2011-11-26 ENCOUNTER — Ambulatory Visit (INDEPENDENT_AMBULATORY_CARE_PROVIDER_SITE_OTHER): Payer: Medicare Other | Admitting: Internal Medicine

## 2011-11-26 ENCOUNTER — Encounter: Payer: Self-pay | Admitting: Internal Medicine

## 2011-11-26 VITALS — BP 130/68 | HR 60 | Temp 97.1°F | Resp 18 | Ht 68.0 in | Wt 152.1 lb

## 2011-11-26 DIAGNOSIS — E119 Type 2 diabetes mellitus without complications: Secondary | ICD-10-CM

## 2011-11-26 DIAGNOSIS — I10 Essential (primary) hypertension: Secondary | ICD-10-CM

## 2011-11-26 DIAGNOSIS — Z23 Encounter for immunization: Secondary | ICD-10-CM

## 2011-11-26 DIAGNOSIS — Z0000 Encounter for general adult medical examination without abnormal findings: Secondary | ICD-10-CM

## 2011-11-26 DIAGNOSIS — N400 Enlarged prostate without lower urinary tract symptoms: Secondary | ICD-10-CM

## 2011-11-26 DIAGNOSIS — Z125 Encounter for screening for malignant neoplasm of prostate: Secondary | ICD-10-CM

## 2011-11-26 DIAGNOSIS — E7800 Pure hypercholesterolemia, unspecified: Secondary | ICD-10-CM

## 2011-11-26 LAB — URINALYSIS, ROUTINE W REFLEX MICROSCOPIC
Ketones, ur: NEGATIVE
Leukocytes, UA: NEGATIVE
Specific Gravity, Urine: 1.01 (ref 1.000–1.030)
Urobilinogen, UA: 0.2 (ref 0.0–1.0)

## 2011-11-26 LAB — LIPID PANEL
Cholesterol: 192 mg/dL (ref 0–200)
HDL: 41.5 mg/dL (ref >39.00)
LDL Cholesterol: 121 mg/dL — ABNORMAL HIGH (ref 0–99)
Total CHOL/HDL Ratio: 5
Triglycerides: 147 mg/dL (ref 0.0–149.0)
VLDL: 29.4 mg/dL (ref 0.0–40.0)

## 2011-11-26 LAB — HEPATIC FUNCTION PANEL
ALT: 13 U/L (ref 0–53)
Albumin: 3.8 g/dL (ref 3.5–5.2)
Alkaline Phosphatase: 49 U/L (ref 39–117)
Bilirubin, Direct: 0.1 mg/dL (ref 0.0–0.3)
Total Protein: 7.5 g/dL (ref 6.0–8.3)

## 2011-11-26 LAB — PSA: PSA: 0.52 ng/mL (ref 0.10–4.00)

## 2011-11-26 LAB — HM DIABETES FOOT EXAM: HM Diabetic Foot Exam: NORMAL

## 2011-11-26 LAB — FECAL OCCULT BLOOD, GUAIAC: Fecal Occult Blood: NEGATIVE

## 2011-11-26 MED ORDER — SITAGLIPTIN PHOS-METFORMIN HCL 50-500 MG PO TABS: 1.0000 | ORAL_TABLET | Freq: Two times a day (BID) | ORAL | Status: DC

## 2011-11-26 NOTE — Progress Notes (Signed)
Subjective:    Patient ID: Arthur Evans, male    DOB: 09/21/1945, 66 y.o.   MRN: [id number]  Diabetes He presents for his follow-up diabetic visit. He has type 2 diabetes mellitus. His disease course has been stable. There are no hypoglycemic associated symptoms. Pertinent negatives for hypoglycemia include no pallor. Pertinent negatives for diabetes include no blurred vision, no chest pain, no fatigue, no foot paresthesias, no foot ulcerations, no polydipsia, no polyphagia, no polyuria, no visual change, no weakness and no weight loss. There are no hypoglycemic complications. Symptoms are stable. Diabetic complications include heart disease. When asked about current treatments, none were reported. He is compliant with treatment none of the time. He is following a generally unhealthy diet. When asked about meal planning, he reported none. He participates in exercise intermittently. There is no change in his home blood glucose trend. An ACE inhibitor/angiotensin II receptor blocker is being taken. He does not see a podiatrist.Eye exam is not current.      Review of Systems  Constitutional: Negative for fever, chills, weight loss, diaphoresis, activity change, appetite change, fatigue and unexpected weight change.  HENT: Negative.   Eyes: Negative.  Negative for blurred vision.  Respiratory: Negative for cough, chest tightness, shortness of breath, wheezing and stridor.   Cardiovascular: Negative for chest pain, palpitations and leg swelling.  Gastrointestinal: Negative for nausea, vomiting, abdominal pain, diarrhea, constipation and blood in stool.  Genitourinary: Negative.  Negative for polyuria.  Musculoskeletal: Negative for myalgias, back pain, joint swelling, arthralgias and gait problem.  Skin: Negative for color change, pallor, rash and wound.  Neurological: Negative.  Negative for weakness.  Hematological: Negative for polydipsia, polyphagia and adenopathy. Does not bruise/bleed easily.   Psychiatric/Behavioral: Negative.        Objective:   Physical Exam  Vitals reviewed. Constitutional: He is oriented to person, place, and time. He appears well-developed. No distress.  HENT:  Head: Normocephalic and atraumatic.  Mouth/Throat: Oropharynx is clear and moist. No oropharyngeal exudate.  Eyes: Conjunctivae normal are normal. Right eye exhibits no discharge. Left eye exhibits no discharge. No scleral icterus.  Neck: Normal range of motion. Neck supple. No JVD present. No tracheal deviation present. No thyromegaly present.  Cardiovascular: Normal rate, regular rhythm, normal heart sounds and intact distal pulses.  Exam reveals no gallop and no friction rub.   No murmur heard. Pulmonary/Chest: Effort normal and breath sounds normal. No stridor. No respiratory distress. He has no wheezes. He has no rales. He exhibits no tenderness.  Abdominal: Soft. Bowel sounds are normal. He exhibits no distension and no mass. There is no tenderness. There is no rebound and no guarding. Hernia confirmed negative in the right inguinal area and confirmed negative in the left inguinal area.  Genitourinary: Rectum normal, prostate normal, testes normal and penis normal. Rectal exam shows no external hemorrhoid, no internal hemorrhoid, no fissure, no mass, no tenderness and anal tone normal. Guaiac negative stool. Prostate is not enlarged and not tender. Right testis shows no mass, no swelling and no tenderness. Right testis is descended. Left testis shows no mass, no swelling and no tenderness. Left testis is descended. Uncircumcised. No phimosis, paraphimosis, hypospadias, penile erythema or penile tenderness. No discharge found.  Musculoskeletal: Normal range of motion. He exhibits no edema and no tenderness.  Lymphadenopathy:    He has no cervical adenopathy.       Right: No inguinal adenopathy present.       Left: No inguinal adenopathy present.  Neurological:  He is oriented to person, place, and  time.  Skin: Skin is warm and dry. No rash noted. He is not diaphoretic. No erythema. No pallor.  Psychiatric: He has a normal mood and affect. Judgment normal. His mood appears not anxious. His affect is not angry, not blunt, not labile and not inappropriate. His speech is delayed and tangential. His speech is not rapid and/or pressured and not slurred. He is slowed. He is not actively hallucinating. Cognition and memory are normal. He does not exhibit a depressed mood. He is communicative. He is inattentive.      Lab Results  Component Value Date   WBC 5.6 11/08/2011   HGB 14.3 11/08/2011   HCT 43.2 11/08/2011   PLT 191.0 11/08/2011   GLUCOSE 180* 11/08/2011   CHOL 261* 11/08/2011   TRIG 165.0* 11/08/2011   HDL 40.70 11/08/2011   LDLDIRECT 181.8 11/08/2011   LDLCALC 121* 04/21/2010   ALT 15 11/08/2011   AST 14 11/08/2011   NA 140 11/08/2011   K 4.4 11/08/2011   CL 105 11/08/2011   CREATININE 0.9 11/08/2011   BUN 18 11/08/2011   CO2 27 11/08/2011   TSH 1.51 11/08/2011   HGBA1C 7.8* 10/20/2009      Assessment & Plan:

## 2011-11-26 NOTE — Patient Instructions (Signed)
Health Maintenance, Males A healthy lifestyle and preventative care can promote health and wellness.  Maintain regular health, dental, and eye exams.  Eat a healthy diet. Foods like vegetables, fruits, whole grains, low-fat dairy products, and lean protein foods contain the nutrients you need without too many calories. Decrease your intake of foods high in solid fats, added sugars, and salt. Get information about a proper diet from your caregiver, if necessary.  Regular physical exercise is one of the most important things you can do for your health. Most adults should get at least 150 minutes of moderate-intensity exercise (any activity that increases your heart rate and causes you to sweat) each week. In addition, most adults need muscle-strengthening exercises on 2 or more days a week.   Maintain a healthy weight. The body mass index (BMI) is a screening tool to identify possible weight problems. It provides an estimate of body fat based on height and weight. Your caregiver can help determine your BMI, and can help you achieve or maintain a healthy weight. For adults 20 years and older:  A BMI below 18.5 is considered underweight.  A BMI of 18.5 to 24.9 is normal.  A BMI of 25 to 29.9 is considered overweight.  A BMI of 30 and above is considered obese.  Maintain normal blood lipids and cholesterol by exercising and minimizing your intake of saturated fat. Eat a balanced diet with plenty of fruits and vegetables. Blood tests for lipids and cholesterol should begin at age 20 and be repeated every 5 years. If your lipid or cholesterol levels are high, you are over 50, or you are a high risk for heart disease, you may need your cholesterol levels checked more frequently.Ongoing high lipid and cholesterol levels should be treated with medicines, if diet and exercise are not effective.  If you smoke, find out from your caregiver how to quit. If you do not use tobacco, do not start.  If you  choose to drink alcohol, do not exceed 2 drinks per day. One drink is considered to be 12 ounces (355 mL) of beer, 5 ounces (148 mL) of wine, or 1.5 ounces (44 mL) of liquor.  Avoid use of street drugs. Do not share needles with anyone. Ask for help if you need support or instructions about stopping the use of drugs.  High blood pressure causes heart disease and increases the risk of stroke. Blood pressure should be checked at least every 1 to 2 years. Ongoing high blood pressure should be treated with medicines if weight loss and exercise are not effective.  If you are 45 to 67 years old, ask your caregiver if you should take aspirin to prevent heart disease.  Diabetes screening involves taking a blood sample to check your fasting blood sugar level. This should be done once every 3 years, after age 45, if you are within normal weight and without risk factors for diabetes. Testing should be considered at a younger age or be carried out more frequently if you are overweight and have at least 1 risk factor for diabetes.  Colorectal cancer can be detected and often prevented. Most routine colorectal cancer screening begins at the age of 50 and continues through age 75. However, your caregiver may recommend screening at an earlier age if you have risk factors for colon cancer. On a yearly basis, your caregiver may provide home test kits to check for hidden blood in the stool. Use of a small camera at the end of a tube,   to directly examine the colon (sigmoidoscopy or colonoscopy), can detect the earliest forms of colorectal cancer. Talk to your caregiver about this at age 50, when routine screening begins. Direct examination of the colon should be repeated every 5 to 10 years through age 75, unless early forms of pre-cancerous polyps or small growths are found.  Hepatitis C blood testing is recommended for all people born from 1945 through 1965 and any individual with known risks for hepatitis C.  Healthy  men should no longer receive prostate-specific antigen (PSA) blood tests as part of routine cancer screening. Consult with your caregiver about prostate cancer screening.  Testicular cancer screening is not recommended for adolescents or adult males who have no symptoms. Screening includes self-exam, caregiver exam, and other screening tests. Consult with your caregiver about any symptoms you have or any concerns you have about testicular cancer.  Practice safe sex. Use condoms and avoid high-risk sexual practices to reduce the spread of sexually transmitted infections (STIs).  Use sunscreen with a sun protection factor (SPF) of 30 or greater. Apply sunscreen liberally and repeatedly throughout the day. You should seek shade when your shadow is shorter than you. Protect yourself by wearing long sleeves, pants, a wide-brimmed hat, and sunglasses year round, whenever you are outdoors.  Notify your caregiver of new moles or changes in moles, especially if there is a change in shape or color. Also notify your caregiver if a mole is larger than the size of a pencil eraser.  A one-time screening for abdominal aortic aneurysm (AAA) and surgical repair of large AAAs by sound wave imaging (ultrasonography) is recommended for ages 65 to 75 years who are current or former smokers.  Stay current with your immunizations. Document Released: 07/21/2007 Document Revised: 04/16/2011 Document Reviewed: 06/19/2010 ExitCare Patient Information 2013 [employer]. Diabetes, Type 2 Diabetes is a long-lasting (chronic) disease. In type 2 diabetes, the pancreas does not make enough insulin (a hormone), and the body does not respond normally to the insulin that is made. This type of diabetes was also previously called adult-onset diabetes. It usually occurs after the age of 40, but it can occur at any age.  CAUSES  Type 2 diabetes happens because the pancreasis not making enough insulin or your body has trouble using  the insulin that your pancreas does make properly. SYMPTOMS   Drinking more than usual.  Urinating more than usual.  Blurred vision.  Dry, itchy skin.  Frequent infections.  Feeling more tired than usual (fatigue). DIAGNOSIS The diagnosis of type 2 diabetes is usually made by one of the following tests:  Fasting blood glucose test. You will not eat for at least 8 hours and then take a blood test.  Random blood glucose test. Your blood glucose (sugar) is checked at any time of the day regardless of when you ate.  Oral glucose tolerance test (OGTT). Your blood glucose is measured after you have not eaten (fasted) and then after you drink a glucose containing beverage. TREATMENT   Healthy eating.  Exercise.  Medicine, if needed.  Monitoring blood glucose.  Seeing your caregiver regularly. HOME CARE INSTRUCTIONS   Check your blood glucose at least once a day. More frequent monitoring may be necessary, depending on your medicines and on how well your diabetes is controlled. Your caregiver will advise you.  Take your medicine as directed by your caregiver.  Do not smoke.  Make wise food choices. Ask your caregiver for information. Weight loss can improve your diabetes.    Learn about low blood glucose (hypoglycemia) and how to treat it.  Get your eyes checked regularly.  Have a yearly physical exam. Have your blood pressure checked and your blood and urine tested.  Wear a pendant or bracelet saying that you have diabetes.  Check your feet every night for cuts, sores, blisters, and redness. Let your caregiver know if you have any problems. SEEK MEDICAL CARE IF:   You have problems keeping your blood glucose in target range.  You have problems with your medicines.  You have symptoms of an illness that do not improve after 24 hours.  You have a sore or wound that is not healing.  You notice a change in vision or a new problem with your vision.  You have a  fever. MAKE SURE YOU:  Understand these instructions.  Will watch your condition.  Will get help right away if you are not doing well or get worse. Document Released: 01/22/2005 Document Revised: 04/16/2011 Document Reviewed: 07/10/2010 ExitCare Patient Information 2013 [employer].  

## 2011-11-26 NOTE — Assessment & Plan Note (Signed)

## 2011-11-26 NOTE — Assessment & Plan Note (Signed)
This is being evaluated by cardiology

## 2011-11-26 NOTE — Assessment & Plan Note (Signed)
His BP is well controlled 

## 2011-11-26 NOTE — Assessment & Plan Note (Signed)
I will check his PSA and UA today 

## 2011-11-26 NOTE — Assessment & Plan Note (Signed)
Will stop the SU since we have better meds and have asked him to start Janumet, today I will check his a1c and his renal function, he was referred for an eye exam

## 2011-11-28 ENCOUNTER — Encounter: Payer: Self-pay | Admitting: Internal Medicine

## 2012-01-02 ENCOUNTER — Ambulatory Visit (AMBULATORY_SURGERY_CENTER): Payer: Medicare Other | Admitting: *Deleted

## 2012-01-02 VITALS — Ht 68.0 in | Wt 153.4 lb

## 2012-01-02 DIAGNOSIS — Z1211 Encounter for screening for malignant neoplasm of colon: Secondary | ICD-10-CM

## 2012-01-02 MED ORDER — NA SULFATE-K SULFATE-MG SULF 17.5-3.13-1.6 GM/177ML PO SOLN: 1.0000 | Freq: Once | ORAL | Status: DC

## 2012-01-05 ENCOUNTER — Other Ambulatory Visit: Payer: Self-pay | Admitting: Cardiology

## 2012-01-08 ENCOUNTER — Telehealth: Payer: Self-pay | Admitting: Internal Medicine

## 2012-01-08 MED ORDER — GLIPIZIDE 5 MG PO TABS: 5.0000 mg | ORAL_TABLET | Freq: Every day | ORAL | Status: DC

## 2012-01-08 NOTE — Telephone Encounter (Signed)
Spoke with patient and he states he could not afford the Moviprep .It would cost him $74.00 for the Moviprep even with his insurance. The pt will come to the office today to pick up a voucher for a free Moviprep. He will call back if he has further questions.

## 2012-01-08 NOTE — Telephone Encounter (Signed)
Request refill on glipizide 5mg , please call into [facility] on [address], please call once sent

## 2012-01-08 NOTE — Telephone Encounter (Signed)
Spoke with the patient and saw he was ordered Suprep bowel prep instead of Moviprep and he did not need the voucher for the free prep of Moviprep. Advised  pt we would give him a free sample of the Suprep. He will come to the office in the morning to pick up Suprep. Informed pt we will have the Suprep at the front desk on the 4th floor. He understood. He will call back if he has further questions.

## 2012-01-15 ENCOUNTER — Encounter: Payer: Self-pay | Admitting: Internal Medicine

## 2012-01-16 ENCOUNTER — Encounter: Payer: Self-pay | Admitting: Internal Medicine

## 2012-01-16 ENCOUNTER — Ambulatory Visit (AMBULATORY_SURGERY_CENTER): Payer: Medicare Other | Admitting: Internal Medicine

## 2012-01-16 VITALS — BP 122/70 | HR 90 | Temp 96.6°F | Resp 16 | Ht 68.0 in | Wt 153.0 lb

## 2012-01-16 DIAGNOSIS — Z1211 Encounter for screening for malignant neoplasm of colon: Secondary | ICD-10-CM

## 2012-01-16 LAB — GLUCOSE, CAPILLARY: Glucose-Capillary: 197 mg/dL — ABNORMAL HIGH (ref 70–99)

## 2012-01-16 MED ORDER — SODIUM CHLORIDE 0.9 % IV SOLN: 500.0000 mL | INTRAVENOUS | Status: DC

## 2012-01-16 NOTE — Op Note (Signed)
[facility] [address] [place], [address]   COLONOSCOPY PROCEDURE REPORT  PATIENT: Arthur Evans, Arthur Evans  MR#: [id number] BIRTHDATE: 07-27-1945 , 66  yrs. old GENDER: Male ENDOSCOPIST: Iva Boop, MD, [facility] REFERRED PI:RJJOAC Karsten Ro, M.D. PROCEDURE DATE:  01/16/2012 PROCEDURE:   Colonoscopy, diagnostic ASA CLASS:   Class III INDICATIONS:average risk screening. MEDICATIONS: propofol (Diprivan) 100mg  IV, MAC sedation, administered by CRNA, and These medications were titrated to patient response per physician's verbal order  DESCRIPTION OF PROCEDURE:   After the risks benefits and alternatives of the procedure were thoroughly explained, informed consent was obtained.  A digital rectal exam revealed no abnormalities of the rectum and A digital rectal exam revealed the prostate was not enlarged.   The LB [id number]  endoscope was introduced through the anus and advanced to the cecum, which was identified by both the appendix and ileocecal valve. No adverse events experienced.   The quality of the prep was Suprep excellent The instrument was then slowly withdrawn as the colon was fully examined.      COLON FINDINGS: A normal appearing cecum, ileocecal valve, and appendiceal orifice were identified.  The ascending, hepatic flexure, transverse, splenic flexure, descending, sigmoid colon and rectum appeared unremarkable.  No polyps or cancers were seen. Retroflexed views revealed no abnormalities. The time to cecum=1 minutes 55 seconds.  Withdrawal time=9 minutes 49 seconds.  The scope was withdrawn and the procedure completed. COMPLICATIONS: There were no complications.  ENDOSCOPIC IMPRESSION: Normal colon excellent prep  RECOMMENDATIONS: Repeat Colonscopy in 10 years.   eSigned:  Iva Boop, MD, [facility] 01/16/2012 11:43 AM   cc: Etta Grandchild, MD and The Patient

## 2012-01-16 NOTE — Progress Notes (Signed)
Patient did not experience any of the following events: a burn prior to discharge; a fall within the facility; wrong site/side/patient/procedure/implant event; or a hospital transfer or hospital admission upon discharge from the facility. (G8907) Patient did not have preoperative order for IV antibiotic SSI prophylaxis. (G8918)  

## 2012-01-16 NOTE — Patient Instructions (Addendum)
The colonoscopy was normal! Next routine colonoscopy in 10 years.  Thank you for choosing me and [facility].  Iva Boop, MD, FACG   YOU HAD AN ENDOSCOPIC PROCEDURE TODAY AT THE [facility]: Refer to the procedure report that was given to you for any specific questions about what was found during the examination.  If the procedure report does not answer your questions, please call your gastroenterologist to clarify.  If you requested that your care partner not be given the details of your procedure findings, then the procedure report has been included in a sealed envelope for you to review at your convenience later.  YOU SHOULD EXPECT: Some feelings of bloating in the abdomen. Passage of more gas than usual.  Walking can help get rid of the air that was put into your GI tract during the procedure and reduce the bloating. If you had a lower endoscopy (such as a colonoscopy or flexible sigmoidoscopy) you may notice spotting of blood in your stool or on the toilet paper. If you underwent a bowel prep for your procedure, then you may not have a normal bowel movement for a few days.  DIET: Your first meal following the procedure should be a light meal and then it is ok to progress to your normal diet.  A half-sandwich or bowl of soup is an example of a good first meal.  Heavy or fried foods are harder to digest and may make you feel nauseous or bloated.  Likewise meals heavy in dairy and vegetables can cause extra gas to form and this can also increase the bloating.  Drink plenty of fluids but you should avoid alcoholic beverages for 24 hours.  ACTIVITY: Your care partner should take you home directly after the procedure.  You should plan to take it easy, moving slowly for the rest of the day.  You can resume normal activity the day after the procedure however you should NOT DRIVE or use heavy machinery for 24 hours (because of the sedation medicines used during the test).     SYMPTOMS TO REPORT IMMEDIATELY: A gastroenterologist can be reached at any hour.  During normal business hours, 8:30 AM to 5:00 PM Monday through Friday, call [phone].  After hours and on weekends, please call the GI answering service at [phone] who will take a message and have the physician on call contact you.   Following lower endoscopy (colonoscopy or flexible sigmoidoscopy):  Excessive amounts of blood in the stool  Significant tenderness or worsening of abdominal pains  Swelling of the abdomen that is new, acute  Fever of 100F or higher  Following upper endoscopy (EGD)  Vomiting of blood or coffee ground material  New chest pain or pain under the shoulder blades  Painful or persistently difficult swallowing  New shortness of breath  Fever of 100F or higher  Black, tarry-looking stools  FOLLOW UP: If any biopsies were taken you will be contacted by phone or by letter within the next 1-3 weeks.  Call your gastroenterologist if you have not heard about the biopsies in 3 weeks.  Our staff will call the home number listed on your records the next business day following your procedure to check on you and address any questions or concerns that you may have at that time regarding the information given to you following your procedure. This is a courtesy call and so if there is no answer at the home number and we have not heard from  you through the emergency physician on call, we will assume that you have returned to your regular daily activities without incident.  SIGNATURES/CONFIDENTIALITY: You and/or your care partner have signed paperwork which will be entered into your electronic medical record.  These signatures attest to the fact that that the information above on your After Visit Summary has been reviewed and is understood.  Full responsibility of the confidentiality of this discharge information lies with you and/or your care-partner.

## 2012-01-17 ENCOUNTER — Telehealth: Payer: Self-pay | Admitting: *Deleted

## 2012-01-17 NOTE — Telephone Encounter (Signed)
  Follow up Call-  Call back number 01/16/2012  Post procedure Call Back phone  # [phone]  Permission to leave phone message Yes     Patient questions:  Do you have a fever, pain , or abdominal swelling? no Pain Score  0 *  Have you tolerated food without any problems? yes  Have you been able to return to your normal activities? yes  Do you have any questions about your discharge instructions: Diet   no Medications  no Follow up visit  no  Do you have questions or concerns about your Care? no  Actions: * If pain score is 4 or above: No action needed, pain <4.

## 2012-01-21 ENCOUNTER — Other Ambulatory Visit: Payer: Self-pay | Admitting: *Deleted

## 2012-01-21 DIAGNOSIS — E7800 Pure hypercholesterolemia, unspecified: Secondary | ICD-10-CM

## 2012-01-21 DIAGNOSIS — I2510 Atherosclerotic heart disease of native coronary artery without angina pectoris: Secondary | ICD-10-CM

## 2012-01-22 ENCOUNTER — Other Ambulatory Visit: Payer: Medicare Other

## 2012-01-23 ENCOUNTER — Other Ambulatory Visit (INDEPENDENT_AMBULATORY_CARE_PROVIDER_SITE_OTHER): Payer: Medicare Other

## 2012-01-23 DIAGNOSIS — E7800 Pure hypercholesterolemia, unspecified: Secondary | ICD-10-CM

## 2012-01-23 DIAGNOSIS — I2510 Atherosclerotic heart disease of native coronary artery without angina pectoris: Secondary | ICD-10-CM

## 2012-01-23 LAB — LIPID PANEL
HDL: 36.9 mg/dL — ABNORMAL LOW (ref >39.00)
Total CHOL/HDL Ratio: 4
Triglycerides: 157 mg/dL — ABNORMAL HIGH (ref 0.0–149.0)
VLDL: 31.4 mg/dL (ref 0.0–40.0)

## 2012-01-23 LAB — HEPATIC FUNCTION PANEL
ALT: 13 U/L (ref 0–53)
AST: 12 U/L (ref 0–37)
Albumin: 3.5 g/dL (ref 3.5–5.2)

## 2012-01-25 ENCOUNTER — Other Ambulatory Visit: Payer: Self-pay | Admitting: *Deleted

## 2012-01-25 DIAGNOSIS — E7800 Pure hypercholesterolemia, unspecified: Secondary | ICD-10-CM

## 2012-01-25 MED ORDER — ATORVASTATIN CALCIUM 40 MG PO TABS: 40.0000 mg | ORAL_TABLET | Freq: Every day | ORAL | Status: DC

## 2012-03-27 ENCOUNTER — Other Ambulatory Visit (INDEPENDENT_AMBULATORY_CARE_PROVIDER_SITE_OTHER): Payer: Medicare Other

## 2012-03-27 DIAGNOSIS — E7800 Pure hypercholesterolemia, unspecified: Secondary | ICD-10-CM

## 2012-03-27 LAB — HEPATIC FUNCTION PANEL
ALT: 16 U/L (ref 0–53)
AST: 15 U/L (ref 0–37)
Albumin: 3.4 g/dL — ABNORMAL LOW (ref 3.5–5.2)
Alkaline Phosphatase: 45 U/L (ref 39–117)
Bilirubin, Direct: 0.1 mg/dL (ref 0.0–0.3)
Total Protein: 6.7 g/dL (ref 6.0–8.3)

## 2012-03-27 LAB — LIPID PANEL: Total CHOL/HDL Ratio: 3

## 2012-05-13 ENCOUNTER — Encounter: Payer: Self-pay | Admitting: Cardiology

## 2012-05-13 ENCOUNTER — Ambulatory Visit (INDEPENDENT_AMBULATORY_CARE_PROVIDER_SITE_OTHER): Payer: Medicare Other | Admitting: Cardiology

## 2012-05-13 VITALS — BP 142/70 | HR 80 | Ht 68.0 in | Wt 150.0 lb

## 2012-05-13 DIAGNOSIS — R0989 Other specified symptoms and signs involving the circulatory and respiratory systems: Secondary | ICD-10-CM | POA: Insufficient documentation

## 2012-05-13 DIAGNOSIS — I714 Abdominal aortic aneurysm, without rupture: Secondary | ICD-10-CM

## 2012-05-13 DIAGNOSIS — F17200 Nicotine dependence, unspecified, uncomplicated: Secondary | ICD-10-CM

## 2012-05-13 DIAGNOSIS — I2510 Atherosclerotic heart disease of native coronary artery without angina pectoris: Secondary | ICD-10-CM

## 2012-05-13 NOTE — Patient Instructions (Addendum)
Your physician has requested that you have a carotid duplex. This test is an ultrasound of the carotid arteries in your neck. It looks at blood flow through these arteries that supply the brain with blood. Allow one hour for this exam. There are no restrictions or special instructions.  Your physician recommends that you return for a FASTING lipid profile: in 6 months.   Your physician wants you to follow-up in: 1 year with Dr Shirlee Latch. (April 2015).  You will receive a reminder letter in the mail two months in advance. If you don't receive a letter, please call our office to schedule the follow-up appointment.

## 2012-05-13 NOTE — Progress Notes (Signed)
Patient ID: Arthur Evans., male   DOB: 1945-08-23, 67 y.o.   MRN: [id number] PCP: Dr. Yetta Barre  67 yo with history of diabetes and smoking presents for cardiology followup. He was in [place] in 7/11 when he developed severe chest pain radiating to his neck and was admitted to [facility] with inferior MI. He had a subtotal RCA occlusion and had 2 Xience DES to the RCA. EF was 50% by LV-gram and echo. Given residual moderate coronary disease in the left system, patient had Lexiscan myoview to assess for ischemia. This showed a fixed basal to mid inferior defect with no ishemia. Repeat echo showed EF 55-60%.   Since last appointment, patient is doing well. He is taking all his meds. He still smokes 1/4 ppd (has cut back).  He has been trying an electronic cigarette.  He denies chest pain with exertion. Mild dyspnea walking up a flight of stairs. 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  Main limitation comes from chronic back pain.  BP is mildly elevated today but has been in the 130s systolic when he checks at his pharmacy.  He has a chronic cough most likely related to smoking (does not cough when he is not smoking).    ECG: NSR, normal  Labs (7/11): creatinine 0.87, TnI 8.89  Labs (9/11): LDL 74, HDL 34, LFTs normal  Labs (3/12): LDL 121 Labs (2/14): LDL 85, HDl 42  Allergies:  1) ! Novocain   Past Medical History:  1. AAA: [facility]ed by [facility], last measured 3.5 x 3.6 by [place] in 6/13.   2. CAD: Inferior MI 7/11, hospitalized at [facility] in [place]. LHC: EF 50% with mild inferior hypokinesis, 70% calcified mid LAD, nearly occluded proximal RCA, 60% mid and distal CFX. Patient had 3.0 x 28 and 3.0 x 18 Xience DES in RCA. Echo (7/11): Mild LVH, EF 50% with inferior mild hypokinesis, RV normal. Lexiscan myoview (8/11) done due to residual moderate disease in the left system showed EF 59% with old basal to mid inferior MI and no ischemia. Repeat echo (9/11) showed EF 55-60%, basal  inferior hypokinesis, mild biatrial enlargement.  3. Smoker  4. Diabetes: Poorly controlled in the past  5. History of back surgery, disabled from back pain.  6. Back problems  7. Hyperlipidemia  8. Allergic rhinitis  Family History:  Father with MI at 81, Mother with MI at 25, sister and brother both with MIs in 30s   Social History:  Lives with son in [place]. Disabled from back pain. 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 Smokes 1/2 ppd. Alcohol use-no  Drug use-no   ROS: All systems reviewed and negative except as per HPI.   Current Outpatient Prescriptions  Medication Sig Dispense Refill  . aspirin 81 MG tablet Take 1 tablet (81 mg total) by mouth daily.      Marland Kitchen atorvastatin (LIPITOR) 40 MG tablet Take 1 tablet (40 mg total) by mouth daily.  30 tablet  3  . buPROPion (WELLBUTRIN SR) 150 MG 12 hr tablet ONE TABLET ONCE DAILY X 3 DAYS THEN ONE TABLET TWICE DAILY  33 tablet  12  . carvedilol (COREG) 6.25 MG tablet TAKE ONE TABLET BY MOUTH TWICE DAILY WITH MEALS  60 tablet  5  . glipiZIDE (GLUCOTROL) 5 MG tablet Take 1 tablet (5 mg total) by mouth daily.  90 tablet  3  . naproxen sodium (ANAPROX) 220 MG tablet Take 220 mg by mouth 2 (  two) times daily with a meal.      . ramipril (ALTACE) 2.5 MG capsule TAKE ONE CAPSULE BY MOUTH TWICE DAILY  60 capsule  5  . sitaGLIPtan-metformin (JANUMET) 50-500 MG per tablet Take 1 tablet by mouth 2 (two) times daily with a meal.  180 tablet  1   No current facility-administered medications for this visit.   BP 142/70  Pulse 80  Ht 5\' 8"  (1.727 m)  Wt 150 lb (68.04 kg)  BMI 22.81 kg/m2  SpO2 96% General: NAD Neck: No JVD, no thyromegaly or thyroid nodule.  Lungs: Clear to auscultation bilaterally with normal respiratory effort. CV: Nondisplaced PMI.  Heart regular S1/S2, no S3/S4, no murmur.  No peripheral edema.  Right carotid bruit.  Normal pedal pulses.  Abdomen: Soft, nontender, no hepatosplenomegaly, no distention.  Neurologic: Alert  and oriented x 3.  Psych: Normal affect. Extremities: No clubbing or cyanosis.   Assessment/Plan: 1. CAD: Stable with no ischemic symptoms.  Continue ASA 81 mg daily, statin, ramipril, Coreg.  2. Smoking: I strongly encouraged him to quit.  Wellbutrin did not work.  He is cutting back some with an electronic cigarette.  3. Hyperlipidemia: Continue statin, acceptable lipids in 2/14.  4. AAA: Followed yearly at [facility].  5. Carotid bruit: Check carotid dopplers.   Marca Ancona 05/13/2012   Marca Ancona 05/13/2012

## 2012-05-28 ENCOUNTER — Encounter (INDEPENDENT_AMBULATORY_CARE_PROVIDER_SITE_OTHER): Payer: Medicare Other

## 2012-05-28 ENCOUNTER — Telehealth: Payer: Self-pay | Admitting: Cardiology

## 2012-05-28 DIAGNOSIS — I6529 Occlusion and stenosis of unspecified carotid artery: Secondary | ICD-10-CM

## 2012-05-28 DIAGNOSIS — R0989 Other specified symptoms and signs involving the circulatory and respiratory systems: Secondary | ICD-10-CM

## 2012-05-28 DIAGNOSIS — I2510 Atherosclerotic heart disease of native coronary artery without angina pectoris: Secondary | ICD-10-CM

## 2012-05-28 NOTE — Telephone Encounter (Signed)
Walk In Pt Form" Handicapped Form" Dropped Off gave to [facility]  05/28/12/KM

## 2012-05-29 ENCOUNTER — Telehealth: Payer: Self-pay | Admitting: Cardiology

## 2012-05-29 NOTE — Telephone Encounter (Signed)
New problem   Pt dropped of papers for handicap placement tag and pt want to know if the form is ready for pick up. Please call pt

## 2012-05-29 NOTE — Telephone Encounter (Signed)
This was routed to me at [facility] in error.

## 2012-06-22 ENCOUNTER — Other Ambulatory Visit: Payer: Self-pay | Admitting: Cardiology

## 2012-07-25 ENCOUNTER — Other Ambulatory Visit: Payer: Self-pay | Admitting: Cardiology

## 2012-08-01 ENCOUNTER — Encounter: Payer: Self-pay | Admitting: Neurosurgery

## 2012-08-04 ENCOUNTER — Encounter (INDEPENDENT_AMBULATORY_CARE_PROVIDER_SITE_OTHER): Payer: Medicare Other | Admitting: *Deleted

## 2012-08-04 ENCOUNTER — Other Ambulatory Visit: Payer: Self-pay | Admitting: *Deleted

## 2012-08-04 ENCOUNTER — Ambulatory Visit: Payer: Medicare Other | Admitting: Neurosurgery

## 2012-08-04 DIAGNOSIS — I714 Abdominal aortic aneurysm, without rupture, unspecified: Secondary | ICD-10-CM

## 2012-08-14 ENCOUNTER — Encounter: Payer: Self-pay | Admitting: Surgery

## 2012-09-16 ENCOUNTER — Other Ambulatory Visit: Payer: Self-pay | Admitting: Internal Medicine

## 2012-09-22 ENCOUNTER — Ambulatory Visit (INDEPENDENT_AMBULATORY_CARE_PROVIDER_SITE_OTHER): Payer: Medicare Other | Admitting: Internal Medicine

## 2012-09-22 ENCOUNTER — Other Ambulatory Visit (INDEPENDENT_AMBULATORY_CARE_PROVIDER_SITE_OTHER): Payer: Medicare Other

## 2012-09-22 ENCOUNTER — Ambulatory Visit (INDEPENDENT_AMBULATORY_CARE_PROVIDER_SITE_OTHER)
Admission: RE | Admit: 2012-09-22 | Discharge: 2012-09-22 | Disposition: A | Discharge status: Home / Self Care | Payer: Medicare Other | Source: Ambulatory Visit | Attending: Internal Medicine | Admitting: Internal Medicine

## 2012-09-22 ENCOUNTER — Encounter: Payer: Self-pay | Admitting: Internal Medicine

## 2012-09-22 VITALS — BP 130/66 | HR 73 | Temp 98.3°F | Resp 16 | Wt 142.0 lb

## 2012-09-22 DIAGNOSIS — R071 Chest pain on breathing: Secondary | ICD-10-CM

## 2012-09-22 DIAGNOSIS — I10 Essential (primary) hypertension: Secondary | ICD-10-CM

## 2012-09-22 DIAGNOSIS — R0789 Other chest pain: Secondary | ICD-10-CM

## 2012-09-22 DIAGNOSIS — E119 Type 2 diabetes mellitus without complications: Secondary | ICD-10-CM

## 2012-09-22 LAB — BASIC METABOLIC PANEL
CO2: 28 mEq/L (ref 19–32)
Chloride: 100 mEq/L (ref 96–112)
GFR: 79.77 mL/min (ref >60.00)
Glucose, Bld: 165 mg/dL — ABNORMAL HIGH (ref 70–99)
Potassium: 4.6 mEq/L (ref 3.5–5.1)
Sodium: 135 mEq/L (ref 135–145)

## 2012-09-22 NOTE — Patient Instructions (Signed)

## 2012-09-22 NOTE — Progress Notes (Signed)
  Subjective:    Patient ID: Arthur Evans., male    DOB: 11/26/1945, 67 y.o.   MRN: [id number]  HPI  He returns for f/up on DM2 but he also reports that he fell 4 days ago and hurt his left rib cage, he has mild soreness over the left lower lateral rib cage but there is no bruising or swelling. He has not taken anything for pain and he does not want anything for pain.  Review of Systems  Constitutional: Positive for unexpected weight change. Negative for fever, chills, diaphoresis, activity change, appetite change and fatigue.  HENT: Negative.   Eyes: Negative.   Respiratory: Negative.  Negative for cough, chest tightness, shortness of breath, wheezing and stridor.   Cardiovascular: Positive for chest pain. Negative for palpitations and leg swelling.  Gastrointestinal: Negative.  Negative for nausea, abdominal pain, diarrhea, constipation and blood in stool.  Endocrine: Negative.   Genitourinary: Negative.   Musculoskeletal: Negative.   Skin: Negative.   Allergic/Immunologic: Negative.   Neurological: Negative.  Negative for dizziness and headaches.  Hematological: Negative.   Psychiatric/Behavioral: Negative.        Objective:   Physical Exam  Vitals reviewed. Constitutional: He is oriented to person, place, and time. He appears well-developed and well-nourished.  Non-toxic appearance. He does not have a sickly appearance. He does not appear ill. No distress.  HENT:  Head: Normocephalic and atraumatic.  Mouth/Throat: Oropharynx is clear and moist. No oropharyngeal exudate.  Eyes: Conjunctivae are normal. Right eye exhibits no discharge. Left eye exhibits no discharge. No scleral icterus.  Neck: Normal range of motion. Neck supple. No JVD present. No tracheal deviation present. No thyromegaly present.  Pulmonary/Chest: Effort normal and breath sounds normal. No accessory muscle usage or stridor. Not tachypneic. No respiratory distress. He has no decreased breath sounds. He has  no wheezes. He has no rhonchi. He has no rales. Chest wall is not dull to percussion. He exhibits tenderness and bony tenderness. He exhibits no mass, no laceration, no crepitus, no edema, no deformity, no swelling and no retraction.    Abdominal: Soft. Bowel sounds are normal. He exhibits no distension and no mass. There is no tenderness. There is no rebound and no guarding.  Musculoskeletal: Normal range of motion. He exhibits no edema and no tenderness.  Lymphadenopathy:    He has no cervical adenopathy.  Neurological: He is oriented to person, place, and time.  Skin: Skin is warm and dry. No rash noted. He is not diaphoretic. No erythema. No pallor.  Psychiatric: He has a normal mood and affect. His behavior is normal. Judgment and thought content normal.     Lab Results  Component Value Date   WBC 5.6 11/08/2011   HGB 14.3 11/08/2011   HCT 43.2 11/08/2011   PLT 191.0 11/08/2011   GLUCOSE 180* 11/08/2011   CHOL 144 03/27/2012   TRIG 87.0 03/27/2012   HDL 41.60 03/27/2012   LDLDIRECT 181.8 11/08/2011   LDLCALC 85 03/27/2012   ALT 16 03/27/2012   AST 15 03/27/2012   NA 140 11/08/2011   K 4.4 11/08/2011   CL 105 11/08/2011   CREATININE 0.9 11/08/2011   BUN 18 11/08/2011   CO2 27 11/08/2011   TSH 1.51 11/08/2011   PSA 0.52 11/26/2011   HGBA1C 9.0* 11/26/2011        Assessment & Plan:

## 2012-09-23 MED ORDER — SITAGLIPTIN PHOS-METFORMIN HCL 50-500 MG PO TABS: 1.0000 | ORAL_TABLET | Freq: Two times a day (BID) | ORAL | Status: DC

## 2012-09-23 NOTE — Assessment & Plan Note (Signed)
His CXR is normal This is a mild contusion He will try APAP for the pain

## 2012-09-23 NOTE — Assessment & Plan Note (Signed)
His BP is well controlled His lytes and renal function are normal 

## 2012-09-23 NOTE — Assessment & Plan Note (Signed)
His A1C is great and his renal function is stable Will continue the same for now

## 2012-11-12 ENCOUNTER — Ambulatory Visit (INDEPENDENT_AMBULATORY_CARE_PROVIDER_SITE_OTHER): Payer: Medicare Other | Admitting: *Deleted

## 2012-11-12 DIAGNOSIS — I2510 Atherosclerotic heart disease of native coronary artery without angina pectoris: Secondary | ICD-10-CM

## 2012-11-12 LAB — LIPID PANEL
HDL: 45.3 mg/dL (ref >39.00)
LDL Cholesterol: 74 mg/dL (ref 0–99)
Total CHOL/HDL Ratio: 3
Triglycerides: 139 mg/dL (ref 0.0–149.0)
VLDL: 27.8 mg/dL (ref 0.0–40.0)

## 2012-12-01 ENCOUNTER — Ambulatory Visit (HOSPITAL_COMMUNITY): Payer: Medicare Other | Attending: Cardiovascular Disease

## 2012-12-01 DIAGNOSIS — I10 Essential (primary) hypertension: Secondary | ICD-10-CM | POA: Insufficient documentation

## 2012-12-01 DIAGNOSIS — I2510 Atherosclerotic heart disease of native coronary artery without angina pectoris: Secondary | ICD-10-CM | POA: Insufficient documentation

## 2012-12-01 DIAGNOSIS — I6529 Occlusion and stenosis of unspecified carotid artery: Secondary | ICD-10-CM

## 2012-12-01 DIAGNOSIS — E785 Hyperlipidemia, unspecified: Secondary | ICD-10-CM | POA: Insufficient documentation

## 2012-12-01 DIAGNOSIS — E119 Type 2 diabetes mellitus without complications: Secondary | ICD-10-CM | POA: Insufficient documentation

## 2012-12-01 DIAGNOSIS — I658 Occlusion and stenosis of other precerebral arteries: Secondary | ICD-10-CM | POA: Insufficient documentation

## 2012-12-01 DIAGNOSIS — R0989 Other specified symptoms and signs involving the circulatory and respiratory systems: Secondary | ICD-10-CM | POA: Insufficient documentation

## 2012-12-01 DIAGNOSIS — F17200 Nicotine dependence, unspecified, uncomplicated: Secondary | ICD-10-CM | POA: Insufficient documentation

## 2012-12-18 LAB — HM DIABETES EYE EXAM

## 2013-02-08 ENCOUNTER — Other Ambulatory Visit: Payer: Self-pay | Admitting: Internal Medicine

## 2013-04-19 ENCOUNTER — Other Ambulatory Visit: Payer: Self-pay | Admitting: Internal Medicine

## 2013-05-20 ENCOUNTER — Other Ambulatory Visit: Payer: Self-pay | Admitting: Internal Medicine

## 2013-05-29 ENCOUNTER — Ambulatory Visit (INDEPENDENT_AMBULATORY_CARE_PROVIDER_SITE_OTHER): Payer: Medicare Other | Admitting: Cardiology

## 2013-05-29 ENCOUNTER — Encounter: Payer: Self-pay | Admitting: Cardiology

## 2013-05-29 VITALS — BP 126/62 | HR 93 | Ht 68.0 in | Wt 145.0 lb

## 2013-05-29 DIAGNOSIS — I2510 Atherosclerotic heart disease of native coronary artery without angina pectoris: Secondary | ICD-10-CM

## 2013-05-29 DIAGNOSIS — I714 Abdominal aortic aneurysm, without rupture, unspecified: Secondary | ICD-10-CM

## 2013-05-29 DIAGNOSIS — F17200 Nicotine dependence, unspecified, uncomplicated: Secondary | ICD-10-CM

## 2013-05-29 DIAGNOSIS — I10 Essential (primary) hypertension: Secondary | ICD-10-CM

## 2013-05-29 DIAGNOSIS — R002 Palpitations: Secondary | ICD-10-CM

## 2013-05-29 DIAGNOSIS — E7800 Pure hypercholesterolemia, unspecified: Secondary | ICD-10-CM

## 2013-05-29 DIAGNOSIS — R011 Cardiac murmur, unspecified: Secondary | ICD-10-CM

## 2013-05-29 DIAGNOSIS — R0989 Other specified symptoms and signs involving the circulatory and respiratory systems: Secondary | ICD-10-CM

## 2013-05-29 NOTE — Patient Instructions (Signed)
Your physician has requested that you have an echocardiogram. Echocardiography is a painless test that uses sound waves to create images of your heart. It provides your doctor with information about the size and shape of your heart and how well your heart's chambers and valves are working. This procedure takes approximately one hour. There are no restrictions for this procedure.  Schedule echo on the same day as carotid.  You have an appt with Dr. Ronnald Ramp at [facility] on 06/03/13 at 3:45pm.  Your physician wants you to follow-up in: 1 year with Dr. Aundra Dubin. You will receive a reminder letter in the mail two months in advance. If you don't receive a letter, please call our office to schedule the follow-up appointment.

## 2013-06-01 DIAGNOSIS — R011 Cardiac murmur, unspecified: Secondary | ICD-10-CM | POA: Insufficient documentation

## 2013-06-01 NOTE — Progress Notes (Signed)
Patient ID: Arthur Gunner., male   DOB: 1945/08/28, 68 y.o.   MRN: [id number] PCP: Dr. Ronnald Ramp  68 yo with history of diabetes and smoking presents for cardiology followup. He was in [place] in 7/11 when he developed severe chest pain radiating to his neck and was admitted to [facility] with inferior MI. He had a subtotal RCA occlusion and had 2 Xience DES to the RCA. EF was 50% by LV-gram and echo. Given residual moderate coronary disease in the left system, patient had Lexiscan myoview to assess for ischemia. This showed a fixed basal to mid inferior defect with no ishemia. Repeat echo in 9/11 showed EF 55-60%.   Since last appointment, patient is doing well. He is taking all his meds. He still smokes 1/4 ppd (has cut back).  He has been trying an electronic cigarette.  He denies chest pain with exertion. Mild dyspnea walking up a flight of stairs. 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  Main limitation comes from chronic back pain.  BP controlled.  He is out of his diabetes medications.   ECG: NSR, normal  Labs (7/11): creatinine 0.87, TnI 8.89  Labs (9/11): LDL 74, HDL 34, LFTs normal  Labs (3/12): LDL 121 Labs (2/14): LDL 85, HDl 42 Labs (8/14): K 4.6, creatinine 1.0 Labs (10/14): LDL 74, HDL 45  Allergies:  1) ! Novocain   Past Medical History:  1. AAA: Followed by [facility], 3.5 x 3.6 by [place] in 6/13, 3.5 x 3.3 by [place] in 6/14.   2. CAD: Inferior MI 7/11, hospitalized at [facility] in [place]. LHC: EF 50% with mild inferior hypokinesis, 70% calcified mid LAD, nearly occluded proximal RCA, 60% mid and distal CFX. Patient had 3.0 x 28 and 3.0 x 18 Xience DES in RCA. Echo (7/11): Mild LVH, EF 50% with inferior mild hypokinesis, RV normal. Lexiscan myoview (8/11) done due to residual moderate disease in the left system showed EF 59% with old basal to mid inferior MI and no ischemia. Repeat echo (9/11) showed EF 55-60%, basal inferior hypokinesis, mild biatrial enlargement.   3. Smoker  4. Diabetes: Poorly controlled in the past  5. History of back surgery, disabled from back pain.  6. Back problems  7. Hyperlipidemia  8. Allergic rhinitis 9. Carotid stenosis: Carotid dopplers (10/14) with 60-79% RICA.   Family History:  Father with MI at 42, Mother with MI at 93, sister and brother both with MIs in 72s   Social History:  Lives with son in [place]. Disabled from back pain. 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 Smokes 1/2 ppd. Alcohol use-no  Drug use-no   ROS: All systems reviewed and negative except as per HPI.   Current Outpatient Prescriptions  Medication Sig Dispense Refill  . aspirin 81 MG tablet Take 1 tablet (81 mg total) by mouth daily.      Marland Kitchen atorvastatin (LIPITOR) 40 MG tablet TAKE ONE TABLET BY MOUTH ONCE DAILY  30 tablet  11  . buPROPion (WELLBUTRIN SR) 150 MG 12 hr tablet ONE TABLET ONCE DAILY X 3 DAYS THEN ONE TABLET TWICE DAILY  33 tablet  12  . carvedilol (COREG) 6.25 MG tablet TAKE ONE TABLET BY MOUTH TWICE DAILY WITH MEALS  60 tablet  11  . glipiZIDE (GLUCOTROL) 5 MG tablet TAKE ONE TABLET BY MOUTH EVERY DAY  90 tablet  0  . ramipril (ALTACE) 2.5 MG capsule TAKE ONE CAPSULE BY MOUTH TWICE DAILY  60 capsule  11   No current facility-administered medications for this visit.   BP 126/62  Pulse 93  Ht 5\' 8"  (1.727 m)  Wt 65.772 kg (145 lb)  BMI 22.05 kg/m2 General: NAD Neck: No JVD, no thyromegaly or thyroid nodule.  Lungs: Clear to auscultation bilaterally with normal respiratory effort. CV: Nondisplaced PMI.  Heart regular S1/S2, no S3/S4, 2/6 HSM apex.  No peripheral edema.  Right carotid bruit.  Pedal pulses difficult to palpate.   Abdomen: Soft, nontender, no hepatosplenomegaly, no distention.  Neurologic: Alert and oriented x 3.  Psych: Normal affect. Extremities: No clubbing or cyanosis.   Assessment/Plan: 1. CAD: Stable with no ischemic symptoms.  Continue ASA 81 mg daily, statin, ramipril, Coreg.  2. Smoking: I  strongly encouraged him to quit.  Wellbutrin did not work.  He is cutting back some with an electronic cigarette.  3. Hyperlipidemia: Continue statin, good lipids 10/14.   4. AAA: Followed [facility] at [facility].  5. Carotid stenosis: Moderate RICA stenosis.  Repeat carotid dopplers in 5/15.  6. Murmur: Apical systolic murmur suggestive of MR.  I will get an echocardiogram.  7. Diabetes: I will try to set him up with an appointment to see Dr. Ronnald Ramp to get his diabetes meds refilled.  He is off all of them.   Larey Dresser 06/01/2013

## 2013-06-03 ENCOUNTER — Encounter: Payer: Self-pay | Admitting: Internal Medicine

## 2013-06-03 ENCOUNTER — Telehealth: Payer: Self-pay | Admitting: Internal Medicine

## 2013-06-03 ENCOUNTER — Other Ambulatory Visit (INDEPENDENT_AMBULATORY_CARE_PROVIDER_SITE_OTHER): Payer: Medicare Other

## 2013-06-03 ENCOUNTER — Ambulatory Visit (INDEPENDENT_AMBULATORY_CARE_PROVIDER_SITE_OTHER): Payer: Medicare Other | Admitting: Internal Medicine

## 2013-06-03 VITALS — BP 110/60 | HR 80 | Temp 97.7°F | Resp 16 | Ht 68.0 in | Wt 143.0 lb

## 2013-06-03 DIAGNOSIS — E1165 Type 2 diabetes mellitus with hyperglycemia: Principal | ICD-10-CM

## 2013-06-03 DIAGNOSIS — IMO0001 Reserved for inherently not codable concepts without codable children: Secondary | ICD-10-CM

## 2013-06-03 DIAGNOSIS — N400 Enlarged prostate without lower urinary tract symptoms: Secondary | ICD-10-CM

## 2013-06-03 DIAGNOSIS — E7800 Pure hypercholesterolemia, unspecified: Secondary | ICD-10-CM

## 2013-06-03 DIAGNOSIS — I10 Essential (primary) hypertension: Secondary | ICD-10-CM

## 2013-06-03 DIAGNOSIS — I2510 Atherosclerotic heart disease of native coronary artery without angina pectoris: Secondary | ICD-10-CM

## 2013-06-03 DIAGNOSIS — Z0000 Encounter for general adult medical examination without abnormal findings: Secondary | ICD-10-CM

## 2013-06-03 LAB — LIPID PANEL
Cholesterol: 189 mg/dL (ref 0–200)
HDL: 46.1 mg/dL (ref >39.00)
LDL Cholesterol: 119 mg/dL — ABNORMAL HIGH (ref 0–99)
TRIGLYCERIDES: 122 mg/dL (ref 0.0–149.0)
Total CHOL/HDL Ratio: 4
VLDL: 24.4 mg/dL (ref 0.0–40.0)

## 2013-06-03 LAB — CBC WITH DIFFERENTIAL/PLATELET
BASOS PCT: 0.5 % (ref 0.0–3.0)
Basophils Absolute: 0 10*3/uL (ref 0.0–0.1)
EOS ABS: 0.2 10*3/uL (ref 0.0–0.7)
Eosinophils Relative: 2.9 % (ref 0.0–5.0)
HEMATOCRIT: 43.8 % (ref 39.0–52.0)
HEMOGLOBIN: 14.8 g/dL (ref 13.0–17.0)
LYMPHS ABS: 3.3 10*3/uL (ref 0.7–4.0)
LYMPHS PCT: 42 % (ref 12.0–46.0)
MCHC: 33.9 g/dL (ref 30.0–36.0)
MCV: 95.2 fl (ref 78.0–100.0)
Monocytes Absolute: 0.5 10*3/uL (ref 0.1–1.0)
Monocytes Relative: 6 % (ref 3.0–12.0)
Neutro Abs: 3.9 10*3/uL (ref 1.4–7.7)
Neutrophils Relative %: 48.6 % (ref 43.0–77.0)
Platelets: 210 10*3/uL (ref 150.0–400.0)
RBC: 4.6 Mil/uL (ref 4.22–5.81)
RDW: 14.2 % (ref 11.5–14.6)
WBC: 7.9 10*3/uL (ref 4.5–10.5)

## 2013-06-03 LAB — COMPREHENSIVE METABOLIC PANEL
ALK PHOS: 63 U/L (ref 39–117)
ALT: 18 U/L (ref 0–53)
AST: 11 U/L (ref 0–37)
Albumin: 3.7 g/dL (ref 3.5–5.2)
BUN: 15 mg/dL (ref 6–23)
CALCIUM: 9.6 mg/dL (ref 8.4–10.5)
CO2: 30 mEq/L (ref 19–32)
CREATININE: 0.9 mg/dL (ref 0.4–1.5)
Chloride: 97 mEq/L (ref 96–112)
GFR: 94.92 mL/min (ref >60.00)
Glucose, Bld: 237 mg/dL — ABNORMAL HIGH (ref 70–99)
Potassium: 4.5 mEq/L (ref 3.5–5.1)
Sodium: 134 mEq/L — ABNORMAL LOW (ref 135–145)
Total Bilirubin: 0.9 mg/dL (ref 0.3–1.2)
Total Protein: 7.2 g/dL (ref 6.0–8.3)

## 2013-06-03 LAB — TSH: TSH: 1 u[IU]/mL (ref 0.35–5.50)

## 2013-06-03 LAB — FECAL OCCULT BLOOD, GUAIAC: Fecal Occult Blood: NEGATIVE

## 2013-06-03 LAB — PSA: PSA: 0.78 ng/mL (ref 0.10–4.00)

## 2013-06-03 LAB — HEMOGLOBIN A1C: HEMOGLOBIN A1C: 10.1 % — AB (ref 4.6–6.5)

## 2013-06-03 MED ORDER — METFORMIN HCL 1000 MG PO TABS: 1000.0000 mg | ORAL_TABLET | Freq: Two times a day (BID) | ORAL | Status: DC

## 2013-06-03 MED ORDER — GLIPIZIDE 5 MG PO TABS: ORAL_TABLET | ORAL | Status: DC

## 2013-06-03 NOTE — Assessment & Plan Note (Addendum)
His blood sugars are not well controlled, he tells me that he has been out of the SU for several months and that janumet was too expensive He will restart the SU and I will add generic metformin

## 2013-06-03 NOTE — Assessment & Plan Note (Signed)
He has achieved his LDL goal 

## 2013-06-03 NOTE — Assessment & Plan Note (Signed)
No s/s noted today

## 2013-06-03 NOTE — Assessment & Plan Note (Addendum)

## 2013-06-03 NOTE — Progress Notes (Signed)
Subjective:    Patient ID: Arthur Evans., male    DOB: 1945/05/18, 68 y.o.   MRN: [id number]  Diabetes He presents for his follow-up diabetic visit. He has type 2 diabetes mellitus. His disease course has been stable. There are no hypoglycemic associated symptoms. Pertinent negatives for hypoglycemia include no dizziness. Pertinent negatives for diabetes include no blurred vision, no chest pain, no fatigue, no foot paresthesias, no foot ulcerations, no polydipsia, no polyphagia, no polyuria, no visual change, no weakness and no weight loss. There are no hypoglycemic complications. Diabetic complications include heart disease. Current diabetic treatment includes oral agent (monotherapy). He is compliant with treatment most of the time. He is following a generally healthy diet. Meal planning includes avoidance of concentrated sweets. He has not had a previous visit with a dietician. He participates in exercise intermittently. There is no change in his home blood glucose trend. An ACE inhibitor/angiotensin II receptor blocker is being taken. He does not see a podiatrist.Eye exam is current.      Review of Systems  Constitutional: Negative.  Negative for weight loss and fatigue.  HENT: Negative.   Eyes: Negative.  Negative for blurred vision.  Respiratory: Negative.  Negative for cough, choking, chest tightness, shortness of breath and stridor.   Cardiovascular: Negative.  Negative for chest pain, palpitations and leg swelling.  Gastrointestinal: Negative.  Negative for nausea, vomiting, abdominal pain, diarrhea, constipation and blood in stool.  Endocrine: Negative.  Negative for polydipsia, polyphagia and polyuria.  Genitourinary: Negative.   Musculoskeletal: Negative.  Negative for arthralgias, myalgias, neck pain and neck stiffness.  Skin: Negative.   Allergic/Immunologic: Negative.   Neurological: Negative.  Negative for dizziness, weakness, light-headedness and numbness.    Hematological: Negative.  Negative for adenopathy. Does not bruise/bleed easily.  Psychiatric/Behavioral: Negative.        Objective:   Physical Exam  Vitals reviewed. Constitutional: He is oriented to person, place, and time. He appears well-developed and well-nourished. No distress.  HENT:  Head: Normocephalic and atraumatic.  Mouth/Throat: Oropharynx is clear and moist. No oropharyngeal exudate.  Eyes: Conjunctivae are normal. Right eye exhibits no discharge. Left eye exhibits no discharge. No scleral icterus.  Neck: Normal range of motion. Neck supple. No JVD present. No tracheal deviation present. No thyromegaly present.  Cardiovascular: Normal rate, regular rhythm, normal heart sounds and intact distal pulses.  Exam reveals no gallop and no friction rub.   No murmur heard. Pulmonary/Chest: Effort normal and breath sounds normal. No stridor. No respiratory distress. He has no wheezes. He has no rales. He exhibits no tenderness.  Abdominal: Soft. Bowel sounds are normal. He exhibits no distension and no mass. There is no tenderness. There is no rebound and no guarding. Hernia confirmed negative in the right inguinal area and confirmed negative in the left inguinal area.  Genitourinary: Rectum normal, prostate normal, testes normal and penis normal. Rectal exam shows no external hemorrhoid, no internal hemorrhoid, no fissure, no mass, no tenderness and anal tone normal. Guaiac negative stool. Prostate is not enlarged and not tender. Right testis shows no mass, no swelling and no tenderness. Right testis is descended. Left testis shows no mass, no swelling and no tenderness. Left testis is descended. Uncircumcised. No phimosis, paraphimosis, hypospadias, penile erythema or penile tenderness. No discharge found.  Musculoskeletal: Normal range of motion. He exhibits no edema and no tenderness.  Lymphadenopathy:    He has no cervical adenopathy.       Right: No inguinal adenopathy  present.        Left: No inguinal adenopathy present.  Neurological: He is oriented to person, place, and time.  Skin: Skin is warm and dry. No rash noted. He is not diaphoretic. No erythema. No pallor.  Psychiatric: He has a normal mood and affect. His behavior is normal. Judgment and thought content normal.     Lab Results  Component Value Date   WBC 5.6 11/08/2011   HGB 14.3 11/08/2011   HCT 43.2 11/08/2011   PLT 191.0 11/08/2011   GLUCOSE 165* 09/22/2012   CHOL 147 11/12/2012   TRIG 139.0 11/12/2012   HDL 45.30 11/12/2012   LDLDIRECT 181.8 11/08/2011   LDLCALC 74 11/12/2012   ALT 16 03/27/2012   AST 15 03/27/2012   NA 135 09/22/2012   K 4.6 09/22/2012   CL 100 09/22/2012   CREATININE 1.0 09/22/2012   BUN 9 09/22/2012   CO2 28 09/22/2012   TSH 1.51 11/08/2011   PSA 0.52 11/26/2011   HGBA1C 7.3* 09/22/2012       Assessment & Plan:

## 2013-06-03 NOTE — Assessment & Plan Note (Signed)
His BP is well controlled 

## 2013-06-03 NOTE — Patient Instructions (Signed)
Health Maintenance, Males A healthy lifestyle and preventative care can promote health and wellness.  Maintain regular health, dental, and eye exams.  Eat a healthy diet. Foods like vegetables, fruits, whole grains, low-fat dairy products, and lean protein foods contain the nutrients you need and are low in calories. Decrease your intake of foods high in solid fats, added sugars, and salt. Get information about a proper diet from your health care provider, if necessary.  Regular physical exercise is one of the most important things you can do for your health. Most adults should get at least 150 minutes of moderate-intensity exercise (any activity that increases your heart rate and causes you to sweat) each week. In addition, most adults need muscle-strengthening exercises on 2 or more days a week.   Maintain a healthy weight. The body mass index (BMI) is a screening tool to identify possible weight problems. It provides an estimate of body fat based on height and weight. Your health care provider can find your BMI and can help you achieve or maintain a healthy weight. For males 20 years and older:  A BMI below 18.5 is considered underweight.  A BMI of 18.5 to 24.9 is normal.  A BMI of 25 to 29.9 is considered overweight.  A BMI of 30 and above is considered obese.  Maintain normal blood lipids and cholesterol by exercising and minimizing your intake of saturated fat. Eat a balanced diet with plenty of fruits and vegetables. Blood tests for lipids and cholesterol should begin at age 20 and be repeated every 5 years. If your lipid or cholesterol levels are high, you are over 50, or you are at high risk for heart disease, you may need your cholesterol levels checked more frequently.Ongoing high lipid and cholesterol levels should be treated with medicines, if diet and exercise are not working.  If you smoke, find out from your health care provider how to quit. If you do not use tobacco, do not  start.  Lung cancer screening is recommended for adults aged 55 80 years who are at high risk for developing lung cancer because of a history of smoking. A yearly low-dose CT scan of the lungs is recommended for people who have at least a 30-pack-year history of smoking and are a current smoker or have quit within the past 15 years. A pack year of smoking is smoking an average of 1 pack of cigarettes a day for 1 year (for example, a 30-pack-year history of smoking could mean smoking 1 pack a day for 30 years or 2 packs a day for 15 years). Yearly screening should continue until the smoker has stopped smoking for at least 15 years. Yearly screening should be stopped for people who develop a health problem that would prevent them from having lung cancer treatment.  If you choose to drink alcohol, do not have more than 2 drinks per day. One drink is considered to be 12 oz (360 mL) of beer, 5 oz (150 mL) of wine, or 1.5 oz (45 mL) of liquor.  Avoid use of street drugs. Do not share needles with anyone. Ask for help if you need support or instructions about stopping the use of drugs.  High blood pressure causes heart disease and increases the risk of stroke. Blood pressure should be checked at least every 1 2 years. Ongoing high blood pressure should be treated with medicines if weight loss and exercise are not effective.  If you are 45 69 years old, ask your health   care provider if you should take aspirin to prevent heart disease.  Diabetes screening involves taking a blood sample to check your fasting blood sugar level. This should be done once every 3 years after age 45, if you are at a normal weight and without risk factors for diabetes. Testing should be considered at a younger age or be carried out more frequently if you are overweight and have at least 1 risk factor for diabetes.  Colorectal cancer can be detected and often prevented. Most routine colorectal cancer screening begins at the age of 50  and continues through age 75. However, your health care provider may recommend screening at an earlier age if you have risk factors for colon cancer. On a yearly basis, your health care provider may provide home test kits to check for hidden blood in the stool. A small camera at the end of a tube may be used to directly examine the colon (sigmoidoscopy or colonoscopy) to detect the earliest forms of colorectal cancer. Talk to your health care provider about this at age 50, when routine screening begins. A direct exam of the colon should be repeated every 5 10 years through age 75, unless early forms of pre-cancerous polyps or small growths are found.  People who are at an increased risk for hepatitis B should be screened for this virus. You are considered at high risk for hepatitis B if:  You were born in a country where hepatitis B occurs often. Talk with your health care provider about which countries are considered high-risk.  Your parents were born in a high-risk country and you have not received a shot to protect against hepatitis B (hepatitis B vaccine).  You have HIV or AIDS.  You use needles to inject street drugs.  You live with, or have sex with, someone who has hepatitis B.  You are a man who has sex with other men (MSM).  You get hemodialysis treatment.  You take certain medicines for conditions like cancer, organ transplantation, and autoimmune conditions.  Hepatitis C blood testing is recommended for all people born from 1945 through 1965 and any individual with known risk factors for hepatitis C.  Healthy men should no longer receive prostate-specific antigen (PSA) blood tests as part of routine cancer screening. Talk to your health care provider about prostate cancer screening.  Testicular cancer screening is not recommended for adolescents or adult males who have no symptoms. Screening includes self-exam, a health care provider exam, and other screening tests. Consult with  your health care provider about any symptoms you have or any concerns you have about testicular cancer.  Practice safe sex. Use condoms and avoid high-risk sexual practices to reduce the spread of sexually transmitted infections (STIs).  Use sunscreen. Apply sunscreen liberally and repeatedly throughout the day. You should seek shade when your shadow is shorter than you. Protect yourself by wearing long sleeves, pants, a wide-brimmed hat, and sunglasses year round, whenever you are outdoors.  Tell your health care provider of new moles or changes in moles, especially if there is a change in shape or color. Also tell your provider if a mole is larger than the size of a pencil eraser.  A one-time screening for abdominal aortic aneurysm (AAA) and surgical repair of large AAAs by ultrasound is recommended for men aged 65 75 years who are current or former smokers.  Stay current with your vaccines (immunizations). Document Released: 07/21/2007 Document Revised: 11/12/2012 Document Reviewed: 06/19/2010 [employer] Patient Information 2014 [employer].   Type 2 Diabetes Mellitus, Adult Type 2 diabetes mellitus, often simply referred to as type 2 diabetes, is a long-lasting (chronic) disease. In type 2 diabetes, the pancreas does not make enough insulin (a hormone), the cells are less responsive to the insulin that is made (insulin resistance), or both. Normally, insulin moves sugars from food into the tissue cells. The tissue cells use the sugars for energy. The lack of insulin or the lack of normal response to insulin causes excess sugars to build up in the blood instead of going into the tissue cells. As a result, high blood sugar (hyperglycemia) develops. The effect of high sugar (glucose) levels can cause many complications. Type 2 diabetes was also previously called adult-onset diabetes but it can occur at any age.  RISK FACTORS  A person is predisposed to developing type 2 diabetes if someone in  the family has the disease and also has one or more of the following primary risk factors:  Overweight.  An inactive lifestyle.  A history of consistently eating high-calorie foods. Maintaining a normal weight and regular physical activity can reduce the chance of developing type 2 diabetes. SYMPTOMS  A person with type 2 diabetes may not show symptoms initially. The symptoms of type 2 diabetes appear slowly. The symptoms include:  Increased thirst (polydipsia).  Increased urination (polyuria).  Increased urination during the night (nocturia).  Weight loss. This weight loss may be rapid.  Frequent, recurring infections.  Tiredness (fatigue).  Weakness.  Vision changes, such as blurred vision.  Fruity smell to your breath.  Abdominal pain.  Nausea or vomiting.  Cuts or bruises which are slow to heal.  Tingling or numbness in the hands or feet. DIAGNOSIS Type 2 diabetes is frequently not diagnosed until complications of diabetes are present. Type 2 diabetes is diagnosed when symptoms or complications are present and when blood glucose levels are increased. Your blood glucose level may be checked by one or more of the following blood tests:  A fasting blood glucose test. You will not be allowed to eat for at least 8 hours before a blood sample is taken.  A random blood glucose test. Your blood glucose is checked at any time of the day regardless of when you ate.  A hemoglobin A1c blood glucose test. A hemoglobin A1c test provides information about blood glucose control over the previous 3 months.  An oral glucose tolerance test (OGTT). Your blood glucose is measured after you have not eaten (fasted) for 2 hours and then after you drink a glucose-containing beverage. TREATMENT   You may need to take insulin or diabetes medicine daily to keep blood glucose levels in the desired range.  You will need to match insulin dosing with exercise and healthy food choices. The  treatment goal is to maintain the before meal blood sugar (preprandial glucose) level at 70 130 mg/dL. HOME CARE INSTRUCTIONS   Have your hemoglobin A1c level checked twice a year.  Perform daily blood glucose monitoring as directed by your caregiver.  Monitor urine ketones when you are ill and as directed by your caregiver.  Take your diabetes medicine or insulin as directed by your caregiver to maintain your blood glucose levels in the desired range.  Never run out of diabetes medicine or insulin. It is needed every day.  Adjust insulin based on your intake of carbohydrates. Carbohydrates can raise blood glucose levels but need to be included in your diet. Carbohydrates provide vitamins, minerals, and fiber which are an essential part   of a healthy diet. Carbohydrates are found in fruits, vegetables, whole grains, dairy products, legumes, and foods containing added sugars.    Eat healthy foods. Alternate 3 meals with 3 snacks.  Lose weight if overweight.  Carry a medical alert card or wear your medical alert jewelry.  Carry a 15 gram carbohydrate snack with you at all times to treat low blood glucose (hypoglycemia). Some examples of 15 gram carbohydrate snacks include:  Glucose tablets, 3 or 4   Glucose gel, 15 gram tube  Raisins, 2 tablespoons (24 grams)  Jelly beans, 6  Animal crackers, 8  Regular pop, 4 ounces (120 mL)  Gummy treats, 9  Recognize hypoglycemia. Hypoglycemia occurs with blood glucose levels of 70 mg/dL and below. The risk for hypoglycemia increases when fasting or skipping meals, during or after intense exercise, and during sleep. Hypoglycemia symptoms can include:  Tremors or shakes.  Decreased ability to concentrate.  Sweating.  Increased heart rate.  Headache.  Dry mouth.  Hunger.  Irritability.  Anxiety.  Restless sleep.  Altered speech or coordination.  Confusion.  Treat hypoglycemia promptly. If you are alert and able to  safely swallow, follow the 15:15 rule:  Take 15 20 grams of rapid-acting glucose or carbohydrate. Rapid-acting options include glucose gel, glucose tablets, or 4 ounces (120 mL) of fruit juice, regular soda, or low fat milk.  Check your blood glucose level 15 minutes after taking the glucose.  Take 15 20 grams more of glucose if the repeat blood glucose level is still 70 mg/dL or below.  Eat a meal or snack within 1 hour once blood glucose levels return to normal.    Be alert to polyuria and polydipsia which are early signs of hyperglycemia. An early awareness of hyperglycemia allows for prompt treatment. Treat hyperglycemia as directed by your caregiver.  Engage in at least 150 minutes of moderate-intensity physical activity a week, spread over at least 3 days of the week or as directed by your caregiver. In addition, you should engage in resistance exercise at least 2 times a week or as directed by your caregiver.  Adjust your medicine and food intake as needed if you start a new exercise or sport.  Follow your sick day plan at any time you are unable to eat or drink as usual.  Avoid tobacco use.  Limit alcohol intake to no more than 1 drink per day for nonpregnant women and 2 drinks per day for men. You should drink alcohol only when you are also eating food. Talk with your caregiver whether alcohol is safe for you. Tell your caregiver if you drink alcohol several times a week.  Follow up with your caregiver regularly.  Schedule an eye exam soon after the diagnosis of type 2 diabetes and then annually.  Perform daily skin and foot care. Examine your skin and feet daily for cuts, bruises, redness, nail problems, bleeding, blisters, or sores. A foot exam by a caregiver should be done annually.  Brush your teeth and gums at least twice a day and floss at least once a day. Follow up with your dentist regularly.  Share your diabetes management plan with your workplace or  school.  Stay up-to-date with immunizations.  Learn to manage stress.  Obtain ongoing diabetes education and support as needed.  Participate in, or seek rehabilitation as needed to maintain or improve independence and quality of life. Request a physical or occupational therapy referral if you are having foot or hand numbness or difficulties with   grooming, dressing, eating, or physical activity. SEEK MEDICAL CARE IF:   You are unable to eat food or drink fluids for more than 6 hours.  You have nausea and vomiting for more than 6 hours.  Your blood glucose level is over 240 mg/dL.  There is a change in mental status.  You develop an additional serious illness.  You have diarrhea for more than 6 hours.  You have been sick or have had a fever for a couple of days and are not getting better.  You have pain during any physical activity.  SEEK IMMEDIATE MEDICAL CARE IF:  You have difficulty breathing.  You have moderate to large ketone levels. MAKE SURE YOU:  Understand these instructions.  Will watch your condition.  Will get help right away if you are not doing well or get worse. Document Released: 01/22/2005 Document Revised: 10/17/2011 Document Reviewed: 08/21/2011 [employer] Patient Information 2014 [employer].  

## 2013-06-03 NOTE — Progress Notes (Signed)
Pre visit review using our clinic review tool, if applicable. No additional management support is needed unless otherwise documented below in the visit note. 

## 2013-06-03 NOTE — Telephone Encounter (Signed)
Relevant patient education assigned to patient using Emmi. ° °

## 2013-06-08 ENCOUNTER — Other Ambulatory Visit (HOSPITAL_COMMUNITY): Payer: Self-pay | Admitting: Cardiology

## 2013-06-08 ENCOUNTER — Encounter (HOSPITAL_COMMUNITY): Payer: Medicare Other

## 2013-06-08 DIAGNOSIS — I6529 Occlusion and stenosis of unspecified carotid artery: Secondary | ICD-10-CM

## 2013-06-16 ENCOUNTER — Other Ambulatory Visit (HOSPITAL_COMMUNITY): Payer: Medicare Other

## 2013-06-17 ENCOUNTER — Encounter (HOSPITAL_COMMUNITY): Payer: Medicare Other

## 2013-06-17 ENCOUNTER — Other Ambulatory Visit (HOSPITAL_COMMUNITY): Payer: Medicare Other

## 2013-06-24 ENCOUNTER — Ambulatory Visit (HOSPITAL_COMMUNITY): Payer: Medicare Other | Attending: Internal Medicine | Admitting: Cardiology

## 2013-06-24 ENCOUNTER — Ambulatory Visit (HOSPITAL_BASED_OUTPATIENT_CLINIC_OR_DEPARTMENT_OTHER): Payer: Medicare Other | Admitting: Cardiology

## 2013-06-24 DIAGNOSIS — I2510 Atherosclerotic heart disease of native coronary artery without angina pectoris: Secondary | ICD-10-CM

## 2013-06-24 DIAGNOSIS — R011 Cardiac murmur, unspecified: Secondary | ICD-10-CM | POA: Insufficient documentation

## 2013-06-24 DIAGNOSIS — I6529 Occlusion and stenosis of unspecified carotid artery: Secondary | ICD-10-CM

## 2013-06-24 NOTE — Progress Notes (Signed)
Echo performed. 

## 2013-06-24 NOTE — Progress Notes (Signed)
Carotid duplex complete 

## 2013-08-01 ENCOUNTER — Other Ambulatory Visit: Payer: Self-pay | Admitting: Cardiology

## 2013-08-11 ENCOUNTER — Telehealth: Payer: Self-pay | Admitting: *Deleted

## 2013-08-11 DIAGNOSIS — E1165 Type 2 diabetes mellitus with hyperglycemia: Principal | ICD-10-CM

## 2013-08-11 DIAGNOSIS — IMO0001 Reserved for inherently not codable concepts without codable children: Secondary | ICD-10-CM

## 2013-08-11 NOTE — Telephone Encounter (Signed)
Patient has an appointment A1c, bmet ordered Diabetic bundle 

## 2013-08-13 ENCOUNTER — Other Ambulatory Visit (HOSPITAL_COMMUNITY): Payer: Medicare Other

## 2013-08-13 ENCOUNTER — Ambulatory Visit: Payer: Medicare Other | Admitting: Family

## 2013-08-19 ENCOUNTER — Encounter: Payer: Self-pay | Admitting: Family

## 2013-08-20 ENCOUNTER — Ambulatory Visit (HOSPITAL_COMMUNITY)
Admission: RE | Admit: 2013-08-20 | Discharge: 2013-08-20 | Disposition: A | Discharge status: Home / Self Care | Payer: Medicare Other | Source: Ambulatory Visit | Attending: Family | Admitting: Family

## 2013-08-20 ENCOUNTER — Ambulatory Visit (INDEPENDENT_AMBULATORY_CARE_PROVIDER_SITE_OTHER): Payer: Medicare Other | Admitting: Family

## 2013-08-20 ENCOUNTER — Encounter: Payer: Self-pay | Admitting: Family

## 2013-08-20 VITALS — BP 101/63 | HR 62 | Resp 14 | Ht 69.0 in | Wt 138.0 lb

## 2013-08-20 DIAGNOSIS — I714 Abdominal aortic aneurysm, without rupture, unspecified: Secondary | ICD-10-CM

## 2013-08-20 NOTE — Patient Instructions (Signed)
Abdominal Aortic Aneurysm An aneurysm is a weakened or damaged part of an artery wall that bulges from the normal force of blood pumping through the body. An abdominal aortic aneurysm is an aneurysm that occurs in the lower part of the aorta, the main artery of the body.  The major concern with an abdominal aortic aneurysm is that it can enlarge and burst (rupture) or blood can flow between the layers of the wall of the aorta through a tear (aorticdissection). Both of these conditions can cause bleeding inside the body and can be life threatening unless diagnosed and treated promptly. CAUSES  The exact cause of an abdominal aortic aneurysm is unknown. Some contributing factors are:   A hardening of the arteries caused by the buildup of fat and other substances in the lining of a blood vessel (arteriosclerosis).  Inflammation of the walls of an artery (arteritis).   Connective tissue diseases, such as Marfan syndrome.   Abdominal trauma.   An infection, such as syphilis or staphylococcus, in the wall of the aorta (infectious aortitis) caused by bacteria. RISK FACTORS  Risk factors that contribute to an abdominal aortic aneurysm may include:  Age older than 60 years.   High blood pressure (hypertension).  Male gender.  Ethnicity (white race).  Obesity.  Family history of aneurysm (first degree relatives only).  Tobacco use. PREVENTION  The following healthy lifestyle habits may help decrease your risk of abdominal aortic aneurysm:  Quitting smoking. Smoking can raise your blood pressure and cause arteriosclerosis.  Limiting or avoiding alcohol.  Keeping your blood pressure, blood sugar level, and cholesterol levels within normal limits.  Decreasing your salt intake. In somepeople, too much salt can raise blood pressure and increase your risk of abdominal aortic aneurysm.  Eating a diet low in saturated fats and cholesterol.  Increasing your fiber intake by including  whole grains, vegetables, and fruits in your diet. Eating these foods may help lower blood pressure.  Maintaining a healthy weight.  Staying physically active and exercising regularly. SYMPTOMS  The symptoms of abdominal aortic aneurysm may vary depending on the size and rate of growth of the aneurysm.Most grow slowly and do not have any symptoms. When symptoms do occur, they may include:  Pain (abdomen, side, lower back, or groin). The pain may vary in intensity. A sudden onset of severe pain may indicate that the aneurysm has ruptured.  Feeling full after eating only small amounts of food.  Nausea or vomiting or both.  Feeling a pulsating lump in the abdomen.  Feeling faint or passing out. DIAGNOSIS  Since most unruptured abdominal aortic aneurysms have no symptoms, they are often discovered during diagnostic exams for other conditions. An aneurysm may be found during the following procedures:  Ultrasonography (A one-time screening for abdominal aortic aneurysm by ultrasonography is also recommended for all men aged 65-75 years who have ever smoked).  X-ray exams.  A computed tomography (CT).  Magnetic resonance imaging (MRI).  Angiography or arteriography. TREATMENT  Treatment of an abdominal aortic aneurysm depends on the size of your aneurysm, your age, and risk factors for rupture. Medication to control blood pressure and pain may be used to manage aneurysms smaller than 6 cm. Regular monitoring for enlargement may be recommended by your caregiver if:  The aneurysm is 3-4 cm in size (an annual ultrasonography may be recommended).  The aneurysm is 4-4.5 cm in size (an ultrasonography every 6 months may be recommended).  The aneurysm is larger than 4.5 cm in   size (your caregiver may ask that you be examined by a vascular surgeon). If your aneurysm is larger than 6 cm, surgical repair may be recommended. There are two main methods for repair of an aneurysm:   Endovascular  repair (a minimally invasive surgery). This is done most often.  Open repair. This method is used if an endovascular repair is not possible. Document Released: 11/01/2004 Document Revised: 05/19/2012 Document Reviewed: 02/22/2012 ExitCare Patient Information 2015 [employer]. This information is not intended to replace advice given to you by your health care provider. Make sure you discuss any questions you have with your health care provider.   Smoking Cessation Quitting smoking is important to your health and has many advantages. However, it is not always easy to quit since nicotine is a very addictive drug. Often times, people try 3 times or more before being able to quit. This document explains the best ways for you to prepare to quit smoking. Quitting takes hard work and a lot of effort, but you can do it. ADVANTAGES OF QUITTING SMOKING  You will live longer, feel better, and live better.  Your body will feel the impact of quitting smoking almost immediately.  Within 20 minutes, blood pressure decreases. Your pulse returns to its normal level.  After 8 hours, carbon monoxide levels in the blood return to normal. Your oxygen level increases.  After 24 hours, the chance of having a heart attack starts to decrease. Your breath, hair, and body stop smelling like smoke.  After 48 hours, damaged nerve endings begin to recover. Your sense of taste and smell improve.  After 72 hours, the body is virtually free of nicotine. Your bronchial tubes relax and breathing becomes easier.  After 2 to 12 weeks, lungs can hold more air. Exercise becomes easier and circulation improves.  The risk of having a heart attack, stroke, cancer, or lung disease is greatly reduced.  After 1 year, the risk of coronary heart disease is cut in half.  After 5 years, the risk of stroke falls to the same as a nonsmoker.  After 10 years, the risk of lung cancer is cut in half and the risk of other cancers  decreases significantly.  After 15 years, the risk of coronary heart disease drops, usually to the level of a nonsmoker.  If you are pregnant, quitting smoking will improve your chances of having a healthy baby.  The people you live with, especially any children, will be healthier.  You will have extra money to spend on things other than cigarettes. QUESTIONS TO THINK ABOUT BEFORE ATTEMPTING TO QUIT You may want to talk about your answers with your caregiver.  Why do you want to quit?  If you tried to quit in the past, what helped and what did not?  What will be the most difficult situations for you after you quit? How will you plan to handle them?  Who can help you through the tough times? Your family? Friends? A caregiver?  What pleasures do you get from smoking? What ways can you still get pleasure if you quit? Here are some questions to ask your caregiver:  How can you help me to be successful at quitting?  What medicine do you think would be best for me and how should I take it?  What should I do if I need more help?  What is smoking withdrawal like? How can I get information on withdrawal? GET READY  Set a quit date.  Change your environment   by getting rid of all cigarettes, ashtrays, matches, and lighters in your home, car, or work. Do not let people smoke in your home.  Review your past attempts to quit. Think about what worked and what did not. GET SUPPORT AND ENCOURAGEMENT You have a better chance of being successful if you have help. You can get support in many ways.  Tell your family, friends, and co-workers that you are going to quit and need their support. Ask them not to smoke around you.  Get individual, group, or telephone counseling and support. Programs are available at local hospitals and health centers. Call your local health department for information about programs in your area.  Spiritual beliefs and practices may help some smokers quit.  Download  a "quit meter" on your computer to keep track of quit statistics, such as how long you have gone without smoking, cigarettes not smoked, and money saved.  Get a self-help book about quitting smoking and staying off of tobacco. LEARN NEW SKILLS AND BEHAVIORS  Distract yourself from urges to smoke. Talk to someone, go for a walk, or occupy your time with a task.  Change your normal routine. Take a different route to work. Drink tea instead of coffee. Eat breakfast in a different place.  Reduce your stress. Take a hot bath, exercise, or read a book.  Plan something enjoyable to do every day. Reward yourself for not smoking.  Explore interactive web-based programs that specialize in helping you quit. GET MEDICINE AND USE IT CORRECTLY Medicines can help you stop smoking and decrease the urge to smoke. Combining medicine with the above behavioral methods and support can greatly increase your chances of successfully quitting smoking.  Nicotine replacement therapy helps deliver nicotine to your body without the negative effects and risks of smoking. Nicotine replacement therapy includes nicotine gum, lozenges, inhalers, nasal sprays, and skin patches. Some may be available over-the-counter and others require a prescription.  Antidepressant medicine helps people abstain from smoking, but how this works is unknown. This medicine is available by prescription.  Nicotinic receptor partial agonist medicine simulates the effect of nicotine in your brain. This medicine is available by prescription. Ask your caregiver for advice about which medicines to use and how to use them based on your health history. Your caregiver will tell you what side effects to look out for if you choose to be on a medicine or therapy. Carefully read the information on the package. Do not use any other product containing nicotine while using a nicotine replacement product.  RELAPSE OR DIFFICULT SITUATIONS Most relapses occur within  the first 3 months after quitting. Do not be discouraged if you start smoking again. Remember, most people try several times before finally quitting. You may have symptoms of withdrawal because your body is used to nicotine. You may crave cigarettes, be irritable, feel very hungry, cough often, get headaches, or have difficulty concentrating. The withdrawal symptoms are only temporary. They are strongest when you first quit, but they will go away within 10-14 days. To reduce the chances of relapse, try to:  Avoid drinking alcohol. Drinking lowers your chances of successfully quitting.  Reduce the amount of caffeine you consume. Once you quit smoking, the amount of caffeine in your body increases and can give you symptoms, such as a rapid heartbeat, sweating, and anxiety.  Avoid smokers because they can make you want to smoke.  Do not let weight gain distract you. Many smokers will gain weight when they quit, usually less   than 10 pounds. Eat a healthy diet and stay active. You can always lose the weight gained after you quit.  Find ways to improve your mood other than smoking. FOR MORE INFORMATION  www.smokefree.gov  Document Released: 01/16/2001 Document Revised: 07/24/2011 Document Reviewed: 05/03/2011 ExitCare Patient Information 2015 [employer]. This information is not intended to replace advice given to you by your health care provider. Make sure you discuss any questions you have with your health care provider.  

## 2013-08-20 NOTE — Progress Notes (Signed)
[facility]  Established Abdominal Aortic Aneurysm  History of Present Illness  Arthur Evans. is a 68 y.o. (1946/01/05) male patient of Dr. Oneida Alar who presents with chief complaint: follow up for AAA.  Previous studies demonstrate an AAA, measuring 3.5 cm.  The patient does  have chronic back pain from an old back injury, he denies any new back pain, he denies abdominal pain.  His small AAA was found incidentally during evaluation of his spine issues.  He has had a couple of back surgeries. The patient is a smoker. The patient denies claudication in legs with walking. His walking is limited due to back issues, denies non healing wounds. The patient denies history of stroke or TIA symptoms. He takes a daily 81 mg ASA, daily statin, and a daily beta blocker. He denies any history of MI but does have a cardiac stent, Dr. Aundra Dubin is his cardiologist.  Pt Diabetic: Yes, states in good control Pt smoker: smoker  (2/3 ppd, decreased from over 1 ppd, started at age 23 yrs)  Past Medical History  Diagnosis Date  . AAA (abdominal aortic aneurysm)   . History of trauma 45    [name] fell on him causing severe back injury  . Diabetes mellitus   . Arthritis   . Anxiety   . Depression   . Myocardial infarction July 2010  . DVT (deep venous thrombosis)    Past Surgical History  Procedure Laterality Date  . Back surgery  1983  . Vasectomy    . Cardiac catheterization      cardiac stents  . Spine surgery     Social History History   Social History  . Marital Status: Divorced    Spouse Name: N/A    Number of Children: N/A  . Years of Education: N/A   Occupational History  . Not on file.   Social History Main Topics  . Smoking status: Current Every Day Smoker -- 0.10 packs/day for 45 years    Types: E-cigarettes  . Smokeless tobacco: Never Used     Comment: trying to quit; has decreased from 2 ppd to 1/2  ppd  . Alcohol Use: No  . Drug Use: No  .  Sexual Activity: Not Currently   Other Topics Concern  . Not on file   Social History Narrative  . No narrative on file   Family History Family History  Problem Relation Age of Onset  . Colon cancer Neg Hx   . Rectal cancer Neg Hx   . Stomach cancer Neg Hx   . Esophageal cancer Neg Hx   . Diabetes Mother   . Hyperlipidemia Mother   . Hypertension Mother   . Heart attack Mother   . Heart disease Father     Before age 2  . Hyperlipidemia Father   . Hypertension Father   . Heart attack Father   . Diabetes Sister   . Hypertension Sister   . Heart attack Sister   . Cancer Brother     Thyroid  . Hypertension Brother     Current Outpatient Prescriptions on File Prior to Visit  Medication Sig Dispense Refill  . aspirin 81 MG tablet Take 1 tablet (81 mg total) by mouth daily.      Marland Kitchen atorvastatin (LIPITOR) 40 MG tablet TAKE ONE TABLET BY MOUTH ONCE DAILY  30 tablet  0  . carvedilol (COREG) 6.25 MG tablet TAKE ONE TABLET BY MOUTH TWICE DAILY WITH MEALS  60 tablet  0  . glipiZIDE (GLUCOTROL) 5 MG tablet TAKE ONE TABLET BY MOUTH EVERY DAY  90 tablet  2  . metFORMIN (GLUCOPHAGE) 1000 MG tablet Take 1 tablet (1,000 mg total) by mouth 2 (two) times daily with a meal.  180 tablet  3  . ramipril (ALTACE) 2.5 MG capsule TAKE ONE CAPSULE BY MOUTH TWICE DAILY  60 capsule  0   No current facility-administered medications on file prior to visit.   Allergies  Allergen Reactions  . Novocain [Procaine Hcl] Anxiety    nervousness    ROS: See HPI for pertinent positives and negatives.  Physical Examination  Filed Vitals:   08/20/13 0936  BP: 101/63  Pulse: 62  Resp: 14  Height: 5\' 9"  (1.753 m)  Weight: 138 lb (62.596 kg)  SpO2: 100%   Body mass index is 20.37 kg/(m^2).  General: A&O x 3, WD.  Pulmonary: Sym exp, fair air movt, CTAB, no rales, rhonchi, or wheezing, occasional moist cough  Cardiac: RRR, Nl S1, S2, no detected murmur.   Carotid Bruits Left Right   Negative  Negative   Aorta is  palpable Radial pulses are 1+ palpable and =                          VASCULAR EXAM:                                                                                                         LE Pulses LEFT RIGHT       FEMORAL  not palpable  1+ palpable        POPLITEAL  not palpable   not palpable       POSTERIOR TIBIAL  nit palpable   not palpable        DORSALIS PEDIS      ANTERIOR TIBIAL 2+ palpable  1+ palpable      Gastrointestinal: soft, NTND, -G/R, - HSM, - masses, - CVAT B.  Musculoskeletal: M/S 5/5 throughout, Extremities without ischemic changes. Posture: leans to his right.  Neurologic: CN 2-12 intact, Pain and light touch intact in extremities are intact except,  Motor exam as listed above.  Non-Invasive Vascular Imaging  AAA Duplex (08/20/2013)  Previous size: 3.5 cm (Date: 08/04/12)  Current size:  3.6 cm (Date: 08/20/2013)  Medical Decision Making  The patient is a 68 y.o. male who presents with asymptomatic AAA with no increase in size. He was counseled re smoking cessation.   Based on this patient's exam and diagnostic studies, the patient will follow up in 1 year  with the following studies: AAA Duplex.  Consideration for repair of AAA would be made when the size is 5.5 cm, growth > 1 cm/yr, and symptomatic status.  I emphasized the importance of maximal medical management including strict control of blood pressure, blood glucose, and lipid levels, antiplatelet agents, obtaining regular exercise, and  cessation of smoking.   The patient was given information about AAA including signs, symptoms, treatment, and how to minimize the  risk of enlargement and rupture of aneurysms.    The patient was advised to call 911 should the patient experience sudden onset abdominal or back pain.   Thank you for allowing [place] to participate in this patient's care.  Clemon Chambers, RN, MSN, FNP-C [facility] Office:  [phone]  Clinic Physician: Kellie Simmering  08/20/2013, 9:45 AM

## 2013-08-20 NOTE — Addendum Note (Signed)
Addended by: Mena Goes on: 08/20/2013 02:47 PM   Modules accepted: Orders

## 2013-09-13 ENCOUNTER — Other Ambulatory Visit: Payer: Self-pay | Admitting: Cardiology

## 2013-09-17 ENCOUNTER — Other Ambulatory Visit: Payer: Self-pay | Admitting: Cardiology

## 2013-10-05 ENCOUNTER — Other Ambulatory Visit (INDEPENDENT_AMBULATORY_CARE_PROVIDER_SITE_OTHER): Payer: Medicare Other

## 2013-10-05 ENCOUNTER — Encounter: Payer: Self-pay | Admitting: Internal Medicine

## 2013-10-05 ENCOUNTER — Ambulatory Visit (INDEPENDENT_AMBULATORY_CARE_PROVIDER_SITE_OTHER): Payer: Medicare Other | Admitting: Internal Medicine

## 2013-10-05 VITALS — BP 130/70 | HR 87 | Temp 98.4°F | Resp 16 | Ht 69.0 in | Wt 136.0 lb

## 2013-10-05 DIAGNOSIS — I714 Abdominal aortic aneurysm, without rupture, unspecified: Secondary | ICD-10-CM

## 2013-10-05 DIAGNOSIS — I10 Essential (primary) hypertension: Secondary | ICD-10-CM

## 2013-10-05 DIAGNOSIS — E1165 Type 2 diabetes mellitus with hyperglycemia: Principal | ICD-10-CM

## 2013-10-05 DIAGNOSIS — IMO0001 Reserved for inherently not codable concepts without codable children: Secondary | ICD-10-CM

## 2013-10-05 DIAGNOSIS — K4091 Unilateral inguinal hernia, without obstruction or gangrene, recurrent: Secondary | ICD-10-CM

## 2013-10-05 DIAGNOSIS — F17200 Nicotine dependence, unspecified, uncomplicated: Secondary | ICD-10-CM

## 2013-10-05 DIAGNOSIS — Z23 Encounter for immunization: Secondary | ICD-10-CM

## 2013-10-05 LAB — BASIC METABOLIC PANEL
BUN: 15 mg/dL (ref 6–23)
CALCIUM: 9.5 mg/dL (ref 8.4–10.5)
CO2: 27 meq/L (ref 19–32)
CREATININE: 1 mg/dL (ref 0.4–1.5)
Chloride: 103 mEq/L (ref 96–112)
GFR: 75.13 mL/min (ref >60.00)
Glucose, Bld: 106 mg/dL — ABNORMAL HIGH (ref 70–99)
Potassium: 4.2 mEq/L (ref 3.5–5.1)
Sodium: 140 mEq/L (ref 135–145)

## 2013-10-05 LAB — HEMOGLOBIN A1C: Hgb A1c MFr Bld: 7.5 % — ABNORMAL HIGH (ref 4.6–6.5)

## 2013-10-05 MED ORDER — INSULIN DETEMIR 100 UNIT/ML FLEXPEN: 30.0000 [IU] | PEN_INJECTOR | Freq: Every day | SUBCUTANEOUS | Status: DC

## 2013-10-05 NOTE — Patient Instructions (Signed)

## 2013-10-05 NOTE — Progress Notes (Signed)
Subjective:    Patient ID: Arthur Evans., male    DOB: 11-29-1945, 68 y.o.   MRN: [id number]  Diabetes He presents for his follow-up diabetic visit. He has type 2 diabetes mellitus. His disease course has been worsening. There are no hypoglycemic associated symptoms. Pertinent negatives for hypoglycemia include no dizziness, headaches or speech difficulty. Associated symptoms include polydipsia, polyphagia, polyuria and weight loss. Pertinent negatives for diabetes include no blurred vision, no chest pain, no fatigue, no foot paresthesias, no foot ulcerations, no visual change and no weakness. There are no hypoglycemic complications. Symptoms are worsening. Diabetic complications include heart disease. Current diabetic treatment includes oral agent (dual therapy). He is compliant with treatment most of the time. His weight is decreasing steadily. He is following a generally healthy diet. Meal planning includes avoidance of concentrated sweets. He has not had a previous visit with a dietician. He participates in exercise intermittently. There is no change in his home blood glucose trend. An ACE inhibitor/angiotensin II receptor blocker is being taken. He does not see a podiatrist.Eye exam is current.      Review of Systems  Constitutional: Positive for weight loss. Negative for fever, chills, diaphoresis, appetite change and fatigue.  HENT: Negative.   Eyes: Negative.  Negative for blurred vision.  Respiratory: Negative.  Negative for apnea, cough, choking, chest tightness, shortness of breath, wheezing and stridor.   Cardiovascular: Negative.  Negative for chest pain, palpitations and leg swelling.  Gastrointestinal: Positive for abdominal pain (he has had a naggin pain in his left groin for several months). Negative for nausea, vomiting, diarrhea, constipation, blood in stool, abdominal distention and anal bleeding.  Endocrine: Positive for polydipsia, polyphagia and polyuria.    Genitourinary: Negative.   Musculoskeletal: Positive for back pain. Negative for arthralgias, gait problem, joint swelling, myalgias, neck pain and neck stiffness.  Skin: Negative.  Negative for rash.  Allergic/Immunologic: Negative.   Neurological: Negative.  Negative for dizziness, syncope, speech difficulty, weakness, light-headedness, numbness and headaches.  Hematological: Negative.  Negative for adenopathy. Does not bruise/bleed easily.  Psychiatric/Behavioral: Negative.        Objective:   Physical Exam  Vitals reviewed. Constitutional: He is oriented to person, place, and time. He appears well-developed and well-nourished. No distress.  HENT:  Head: Normocephalic and atraumatic.  Mouth/Throat: Oropharynx is clear and moist. No oropharyngeal exudate.  Eyes: Conjunctivae are normal. Right eye exhibits no discharge. Left eye exhibits no discharge. No scleral icterus.  Neck: Normal range of motion. Neck supple. No JVD present. No tracheal deviation present. No thyromegaly present.  Cardiovascular: Normal rate, regular rhythm, S1 normal, S2 normal and intact distal pulses.  Exam reveals no gallop, no S3, no S4 and no friction rub.   Murmur heard.  Decrescendo systolic murmur is present with a grade of 1/6   No diastolic murmur is present  Pulses:      Carotid pulses are 1+ on the right side, and 1+ on the left side.      Radial pulses are 1+ on the right side, and 1+ on the left side.       Femoral pulses are 1+ on the right side, and 1+ on the left side.      Popliteal pulses are 1+ on the right side, and 1+ on the left side.       Dorsalis pedis pulses are 1+ on the right side, and 1+ on the left side.       Posterior tibial pulses  are 1+ on the right side, and 1+ on the left side.  Pulmonary/Chest: Effort normal and breath sounds normal. No stridor. No respiratory distress. He has no wheezes. He has no rales. He exhibits no tenderness.  Abdominal: Soft. Normal appearance and  bowel sounds are normal. He exhibits no distension and no mass. There is no hepatosplenomegaly, splenomegaly or hepatomegaly. There is no tenderness. There is no rebound, no guarding and no CVA tenderness. A hernia is present. Hernia confirmed positive in the left inguinal area. Hernia confirmed negative in the ventral area and confirmed negative in the right inguinal area.  Musculoskeletal: Normal range of motion. He exhibits no edema and no tenderness.  Lymphadenopathy:    He has no cervical adenopathy.  Neurological: He is oriented to person, place, and time.  Skin: Skin is warm and dry. No rash noted. He is not diaphoretic. No erythema. No pallor.     Lab Results  Component Value Date   WBC 7.9 06/03/2013   HGB 14.8 06/03/2013   HCT 43.8 06/03/2013   PLT 210.0 06/03/2013   GLUCOSE 237* 06/03/2013   CHOL 189 06/03/2013   TRIG 122.0 06/03/2013   HDL 46.10 06/03/2013   LDLDIRECT 181.8 11/08/2011   LDLCALC 119* 06/03/2013   ALT 18 06/03/2013   AST 11 06/03/2013   NA 134* 06/03/2013   K 4.5 06/03/2013   CL 97 06/03/2013   CREATININE 0.9 06/03/2013   BUN 15 06/03/2013   CO2 30 06/03/2013   TSH 1.00 06/03/2013   PSA 0.78 06/03/2013   HGBA1C 10.1* 06/03/2013       Assessment & Plan:

## 2013-10-05 NOTE — Progress Notes (Signed)
Pre visit review using our clinic review tool, if applicable. No additional management support is needed unless otherwise documented below in the visit note. 

## 2013-10-06 ENCOUNTER — Encounter: Payer: Self-pay | Admitting: Internal Medicine

## 2013-10-06 DIAGNOSIS — Z2911 Encounter for prophylactic immunotherapy for respiratory syncytial virus (RSV): Secondary | ICD-10-CM

## 2013-10-06 DIAGNOSIS — Z23 Encounter for immunization: Secondary | ICD-10-CM

## 2013-10-06 NOTE — Assessment & Plan Note (Addendum)
Will send to general surgery for evaluation of this

## 2013-10-06 NOTE — Assessment & Plan Note (Signed)
He is symptomatic so will start levemir and will cont the other meds I have also asked him to see diabetic education and to see ENDO

## 2013-10-06 NOTE — Assessment & Plan Note (Signed)
His BP is well controlled His lytes and renal function are stable 

## 2013-10-08 ENCOUNTER — Telehealth: Payer: Self-pay | Admitting: *Deleted

## 2013-10-08 MED ORDER — ONETOUCH DELICA LANCETS 33G MISC: Status: DC

## 2013-10-08 MED ORDER — ONETOUCH ULTRA MINI W/DEVICE KIT: PACK | Status: DC

## 2013-10-08 MED ORDER — GLUCOSE BLOOD VI STRP: ORAL_STRIP | Status: DC

## 2013-10-08 NOTE — Telephone Encounter (Signed)
Pt is needing a BS monitor to check blood sugars. MD rx levemir & he is not sure what his blood sugar has been reading. Inform pt will send rx to [facility]...Johny Chess

## 2013-10-15 ENCOUNTER — Ambulatory Visit: Payer: Medicare Other | Admitting: Dietician

## 2013-10-22 ENCOUNTER — Other Ambulatory Visit (INDEPENDENT_AMBULATORY_CARE_PROVIDER_SITE_OTHER): Payer: Self-pay

## 2013-10-22 DIAGNOSIS — R1032 Left lower quadrant pain: Secondary | ICD-10-CM

## 2013-10-27 ENCOUNTER — Ambulatory Visit
Admission: RE | Admit: 2013-10-27 | Discharge: 2013-10-27 | Disposition: A | Discharge status: Home / Self Care | Payer: Medicare Other | Source: Ambulatory Visit | Attending: General Surgery | Admitting: General Surgery

## 2013-10-27 DIAGNOSIS — R1032 Left lower quadrant pain: Secondary | ICD-10-CM

## 2013-10-27 MED ORDER — IOHEXOL 300 MG/ML  SOLN
100.0000 mL | Freq: Once | INTRAMUSCULAR | Status: AC | PRN
  Administered 2013-10-27: 100 mL via INTRAVENOUS

## 2013-10-28 ENCOUNTER — Telehealth: Payer: Self-pay | Admitting: Internal Medicine

## 2013-10-28 NOTE — Telephone Encounter (Signed)
Noted  

## 2013-10-28 NOTE — Telephone Encounter (Signed)
Inquiring on back brace and diabetic shoe orders that were sent over on 9/1.  She is going to refax today.

## 2013-11-12 ENCOUNTER — Other Ambulatory Visit: Payer: Self-pay | Admitting: *Deleted

## 2013-11-12 MED ORDER — RAMIPRIL 2.5 MG PO CAPS: ORAL_CAPSULE | ORAL | Status: DC

## 2013-11-16 ENCOUNTER — Other Ambulatory Visit: Payer: Self-pay | Admitting: Internal Medicine

## 2013-11-17 ENCOUNTER — Telehealth: Payer: Self-pay

## 2013-11-17 NOTE — Telephone Encounter (Signed)
Pt stated that he has not had his eye exam this year, but had one last year.  He was unsure of the facility, but stated it was "[facility]."

## 2013-11-17 NOTE — Telephone Encounter (Signed)
Pt also wanted to make Dr. Ronnald Ramp aware that he has stopped taking his metformin and 30 units of Levemir.  He stopped taking his metformin right before his ct scan on 10/27/13 and his Levemir approximately a month ago.  He stopped metformin because he was instructed to stop it before the scan and then per patient was instructed not to restart it until advised by his PCP.  He stopped taking the Levemir because he felt it was dropping his blood sugars too low.  He stated that after he would take it, he would start sweating and become really sleepy.  He is still taking his glipizide daily and states that his blood sugars have been ranging from the 90s to 150s.  Pt reported his actual blood sugar readings as follows:    Blood Sugar Readings: 10/9- after breakfast-193; evening- 148 10/10-before breakfast-168 10/11- after breakfast- 212; evening- 87 10/12- evening- 92  No upcoming appointment with Dr. Ronnald Ramp scheduled.  Referral to Endo cancelled.    Please advise.

## 2013-11-18 ENCOUNTER — Ambulatory Visit: Payer: Medicare Other | Admitting: *Deleted

## 2013-12-01 ENCOUNTER — Telehealth: Payer: Self-pay | Admitting: *Deleted

## 2013-12-01 NOTE — Telephone Encounter (Signed)
Received call from RN with [facility] stating pt is enrolled in there diabetes program needing to know pt last A1C & last day he saw md. Gave last A1c & last saw md back in august.../lmb

## 2013-12-29 ENCOUNTER — Telehealth: Payer: Self-pay | Admitting: Internal Medicine

## 2013-12-29 DIAGNOSIS — E118 Type 2 diabetes mellitus with unspecified complications: Secondary | ICD-10-CM

## 2013-12-29 NOTE — Telephone Encounter (Signed)
[employer] called to state pt has not been taking the Metformin (apparently people who did CT told pt to stop taking and resume after) Requesting a call. [phone]

## 2013-12-29 NOTE — Telephone Encounter (Addendum)
Returned patient's call. Patient wants to know if he should start back on he metformin? Also, patient stated that he has only taken his Levemir since he had CT done on 10/27/13, about five times and each time he took Levemir he was sick the next day with heavy sweating and tiredness. Patient wanted to know if he needs the dose changed on Levemir. Patient's BS has been 100-150 when tested before meals. Patient has f/u appt 02/08/2014. Please advise?

## 2013-12-30 MED ORDER — METFORMIN HCL 1000 MG PO TABS: 1000.0000 mg | ORAL_TABLET | Freq: Two times a day (BID) | ORAL | Status: DC

## 2013-12-30 NOTE — Telephone Encounter (Signed)
Stop levemir Restart metformin

## 2013-12-30 NOTE — Telephone Encounter (Signed)
Patient notified

## 2014-02-08 ENCOUNTER — Encounter: Payer: Self-pay | Admitting: Internal Medicine

## 2014-02-08 ENCOUNTER — Ambulatory Visit (INDEPENDENT_AMBULATORY_CARE_PROVIDER_SITE_OTHER): Payer: Commercial Managed Care - HMO | Admitting: Internal Medicine

## 2014-02-08 ENCOUNTER — Other Ambulatory Visit (INDEPENDENT_AMBULATORY_CARE_PROVIDER_SITE_OTHER): Payer: Commercial Managed Care - HMO

## 2014-02-08 VITALS — BP 122/64 | HR 77 | Temp 97.7°F | Resp 16 | Ht 68.0 in | Wt 143.0 lb

## 2014-02-08 DIAGNOSIS — IMO0002 Reserved for concepts with insufficient information to code with codable children: Secondary | ICD-10-CM

## 2014-02-08 DIAGNOSIS — E7800 Pure hypercholesterolemia, unspecified: Secondary | ICD-10-CM

## 2014-02-08 DIAGNOSIS — E118 Type 2 diabetes mellitus with unspecified complications: Secondary | ICD-10-CM

## 2014-02-08 DIAGNOSIS — Z23 Encounter for immunization: Secondary | ICD-10-CM

## 2014-02-08 DIAGNOSIS — I10 Essential (primary) hypertension: Secondary | ICD-10-CM

## 2014-02-08 DIAGNOSIS — E1165 Type 2 diabetes mellitus with hyperglycemia: Secondary | ICD-10-CM

## 2014-02-08 LAB — URINALYSIS, ROUTINE W REFLEX MICROSCOPIC
BILIRUBIN URINE: NEGATIVE
HGB URINE DIPSTICK: NEGATIVE
KETONES UR: NEGATIVE
Leukocytes, UA: NEGATIVE
Nitrite: NEGATIVE
Specific Gravity, Urine: 1.02 (ref 1.000–1.030)
Total Protein, Urine: NEGATIVE
URINE GLUCOSE: 500 — AB
UROBILINOGEN UA: 0.2 (ref 0.0–1.0)
pH: 5.5 (ref 5.0–8.0)

## 2014-02-08 LAB — BASIC METABOLIC PANEL
BUN: 13 mg/dL (ref 6–23)
CHLORIDE: 103 meq/L (ref 96–112)
CO2: 28 meq/L (ref 19–32)
Calcium: 9.4 mg/dL (ref 8.4–10.5)
Creatinine, Ser: 1 mg/dL (ref 0.4–1.5)
GFR: 83.32 mL/min (ref >60.00)
Glucose, Bld: 180 mg/dL — ABNORMAL HIGH (ref 70–99)
Potassium: 4.7 mEq/L (ref 3.5–5.1)
SODIUM: 139 meq/L (ref 135–145)

## 2014-02-08 LAB — LIPID PANEL
Cholesterol: 175 mg/dL (ref 0–200)
HDL: 39.5 mg/dL (ref >39.00)
LDL Cholesterol: 109 mg/dL — ABNORMAL HIGH (ref 0–99)
NonHDL: 135.5
Total CHOL/HDL Ratio: 4
Triglycerides: 132 mg/dL (ref 0.0–149.0)
VLDL: 26.4 mg/dL (ref 0.0–40.0)

## 2014-02-08 LAB — HEMOGLOBIN A1C: Hgb A1c MFr Bld: 8.8 % — ABNORMAL HIGH (ref 4.6–6.5)

## 2014-02-08 LAB — MICROALBUMIN / CREATININE URINE RATIO
Creatinine,U: 123.9 mg/dL
Microalb Creat Ratio: 0.6 mg/g (ref 0.0–30.0)
Microalb, Ur: 0.8 mg/dL (ref 0.0–1.9)

## 2014-02-08 LAB — TSH: TSH: 1.21 u[IU]/mL (ref 0.35–4.50)

## 2014-02-08 MED ORDER — PNEUMOCOCCAL 13-VAL CONJ VACC IM SUSP: 0.5000 mL | INTRAMUSCULAR | Status: AC

## 2014-02-08 MED ORDER — INSULIN DETEMIR 100 UNIT/ML FLEXPEN: 30.0000 [IU] | PEN_INJECTOR | Freq: Every day | SUBCUTANEOUS | Status: DC

## 2014-02-08 NOTE — Assessment & Plan Note (Signed)
He has achieved his LDL goal 

## 2014-02-08 NOTE — Assessment & Plan Note (Signed)
His BP is well controlled Lytes and renal function are stable 

## 2014-02-08 NOTE — Progress Notes (Signed)
Subjective:    Patient ID: Arthur Evans., male    DOB: 1945/04/03, 69 y.o.   MRN: [id number]  Diabetes He presents for his follow-up diabetic visit. He has type 2 diabetes mellitus. His disease course has been stable. There are no hypoglycemic associated symptoms. There are no diabetic associated symptoms. Pertinent negatives for diabetes include no chest pain, no fatigue, no foot paresthesias, no foot ulcerations, no polydipsia, no polyphagia, no polyuria, no visual change, no weakness and no weight loss. There are no hypoglycemic complications. There are no diabetic complications. Current diabetic treatment includes oral agent (dual therapy). He is compliant with treatment most of the time. His weight is stable. He is following a generally healthy diet. Meal planning includes avoidance of concentrated sweets. He participates in exercise intermittently. There is no change in his home blood glucose trend. An ACE inhibitor/angiotensin II receptor blocker is being taken. He does not see a podiatrist.Eye exam is not current.      Review of Systems  Constitutional: Negative.  Negative for fever, chills, weight loss, diaphoresis and fatigue.  HENT: Negative.   Eyes: Negative.   Respiratory: Negative.  Negative for cough, choking, chest tightness, shortness of breath and stridor.   Cardiovascular: Negative.  Negative for chest pain, palpitations and leg swelling.  Gastrointestinal: Negative.  Negative for nausea, vomiting, abdominal pain, diarrhea, constipation and blood in stool.  Endocrine: Negative.  Negative for polydipsia, polyphagia and polyuria.  Genitourinary: Negative.   Musculoskeletal: Negative.  Negative for myalgias, back pain, arthralgias and neck pain.  Skin: Negative.  Negative for color change.  Allergic/Immunologic: Negative.   Neurological: Negative.  Negative for weakness.  Hematological: Negative.  Negative for adenopathy. Does not bruise/bleed easily.    Psychiatric/Behavioral: Negative.        Objective:   Physical Exam  Constitutional: He is oriented to person, place, and time. He appears well-developed and well-nourished. No distress.  HENT:  Head: Normocephalic and atraumatic.  Mouth/Throat: Oropharynx is clear and moist. No oropharyngeal exudate.  Eyes: Conjunctivae are normal. Right eye exhibits no discharge. Left eye exhibits no discharge. No scleral icterus.  Neck: Normal range of motion. Neck supple. No JVD present. No tracheal deviation present. No thyromegaly present.  Cardiovascular: Normal rate, regular rhythm, normal heart sounds and intact distal pulses.  Exam reveals no gallop and no friction rub.   No murmur heard. Pulmonary/Chest: Effort normal and breath sounds normal. No stridor. No respiratory distress. He has no wheezes. He has no rales. He exhibits no tenderness.  Abdominal: Soft. Bowel sounds are normal. He exhibits no distension and no mass. There is no tenderness. There is no rebound and no guarding.  Musculoskeletal: Normal range of motion. He exhibits no edema or tenderness.  Lymphadenopathy:    He has no cervical adenopathy.  Neurological: He is oriented to person, place, and time.  Skin: Skin is warm and dry. No rash noted. He is not diaphoretic. No erythema. No pallor.  Psychiatric: He has a normal mood and affect. His behavior is normal. Judgment and thought content normal.  Vitals reviewed.    Lab Results  Component Value Date   WBC 7.9 06/03/2013   HGB 14.8 06/03/2013   HCT 43.8 06/03/2013   PLT 210.0 06/03/2013   GLUCOSE 180* 02/08/2014   CHOL 175 02/08/2014   TRIG 132.0 02/08/2014   HDL 39.50 02/08/2014   LDLDIRECT 181.8 11/08/2011   LDLCALC 109* 02/08/2014   ALT 18 06/03/2013   AST 11 06/03/2013  NA 139 02/08/2014   K 4.7 02/08/2014   CL 103 02/08/2014   CREATININE 1.0 02/08/2014   BUN 13 02/08/2014   CO2 28 02/08/2014   TSH 1.21 02/08/2014   PSA 0.78 06/03/2013   HGBA1C 8.8*  02/08/2014   MICROALBUR 0.8 02/08/2014       Assessment & Plan:

## 2014-02-08 NOTE — Assessment & Plan Note (Signed)
His blood sugars are not well controlled and he has glucouria He will cont the two oral meds but I have asked him to start a basal insulin injection as well I have also referred him for diabetic education

## 2014-02-15 ENCOUNTER — Other Ambulatory Visit: Payer: Self-pay | Admitting: Cardiology

## 2014-03-03 LAB — HM DIABETES EYE EXAM

## 2014-03-12 ENCOUNTER — Encounter: Payer: Self-pay | Admitting: Dietician

## 2014-03-12 ENCOUNTER — Encounter: Payer: Commercial Managed Care - HMO | Attending: Internal Medicine | Admitting: Dietician

## 2014-03-12 VITALS — Ht 71.0 in | Wt 142.0 lb

## 2014-03-12 DIAGNOSIS — Z713 Dietary counseling and surveillance: Secondary | ICD-10-CM | POA: Insufficient documentation

## 2014-03-12 DIAGNOSIS — Z794 Long term (current) use of insulin: Secondary | ICD-10-CM | POA: Insufficient documentation

## 2014-03-12 DIAGNOSIS — E118 Type 2 diabetes mellitus with unspecified complications: Secondary | ICD-10-CM | POA: Diagnosis present

## 2014-03-12 DIAGNOSIS — IMO0002 Reserved for concepts with insufficient information to code with codable children: Secondary | ICD-10-CM

## 2014-03-12 DIAGNOSIS — E1165 Type 2 diabetes mellitus with hyperglycemia: Secondary | ICD-10-CM

## 2014-03-12 NOTE — Progress Notes (Signed)
Diabetes Self-Management Education  Visit Type:    Appt. Start Time: 0815 Appt. End Time: 0915  03/12/2014  [name], 09-09-1945, is a 69 y.o. male with a diagnosis of Diabetes: Type 2.  Other people present during visit:  Patient present with daughter.  He lives with daughter and grandchildren.  He is very bent over because of his back and recently fell on the ice 1 week ago.  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    He tried insulin recently but no longer takes this due to extreme sweats and shaking with each use.  ASSESSMENT  Height 5\' 11"  (1.803 m), weight 142 lb (64.411 kg). Body mass index is 19.81 kg/(m^2).  Initial Visit Information:  Are you currently following a meal plan?: No (Patient lives with daughter and grandchildren.  Daughter shops and cooks.)   Are you taking your medications as prescribed?: Yes Are you checking your feet?: Yes How many days per week are you checking your feet?: 7 How often do you need to have someone help you when you read instructions, pamphlets, or other written materials from your doctor or pharmacy?: 5 - Always What is the last grade level you completed in school?: 6th grade elementary then GED  Psychosocial:     Patient Belief/Attitude about Diabetes: Motivated to manage diabetes Self-care barriers: None Self-management support: Doctor's office, Family Other persons present: Patient, Other (comment) (daughter) Patient Concerns: Nutrition/Meal planning Special Needs: None Preferred Learning Style: No preference indicated Learning Readiness: Ready  Complications:   Last HgB A1C per patient/outside source: 8.8 mg/dL (02/08/14 increased from 7.5 09/18/14) How often do you check your blood sugar?: 1-2 times/day (before breakfast and 1-2 hours after dinner.) Fasting Blood glucose range (mg/dL): 130-179 Postprandial Blood glucose range (mg/dL): 180-200 Number of hypoglycemic episodes  per month: 0 Number of hyperglycemic episodes per week: 0 Have you had a dilated eye exam in the past 12 months?: Yes Have you had a dental exam in the past 12 months?: No  Diet Intake:  Breakfast: sausage, egg and cheese biscuit with dirty rice or hashbrowns from [employer] 3-4 times per week OR coffee with dry creamer and artificial sweetener (9:30-11:00 am) Snack (morning): cheese crackers or donuts Lunch: often none Snack (afternoon): crackers and peanut butter or crackers and cheese or cookies on occasion Dinner: baked meat or crock pot, 1 or 2 veges, a starch or soup or hamburgers Snack (evening): 1 1/2 cups ice cream on occasion Beverage(s): 5 mugs of coffee with artificial sweetener and non dairy creamer, diet Mt. Dew  Exercise:  Exercise: ADL's (lifts light weights with arms.unable to walk or do other exercise secondary to  back injury)  Individualized Plan for Diabetes Self-Management Training:   Learning Objective:  Patient will have a greater understanding of diabetes self-management.  Patient education plan per assessed needs and concerns is to attend individual sessions for     Education Topics Reviewed with Patient Today:  Definition of diabetes, type 1 and 2, and the diagnosis of diabetes Food label reading, portion sizes and measuring food., Carbohydrate counting, Role of diet in the treatment of diabetes and the relationship between the three main macronutrients and blood glucose level, Information on hints to eating out and maintain blood glucose control. Helped patient identify appropriate exercises in relation to his/her diabetes, diabetes complications and other health issue.   Interpreting lab values - A1C, lipid, urine microalbumina.   Relationship between  chronic complications and blood glucose control Worked with patient to identify barriers to care and solutions, Role of stress on diabetes   Lifestyle issues that need to be addressed for better diabetes  care  PATIENTS GOALS/Plan (Developed by the patient):  Nutrition: Follow meal plan discussed, General guidelines for healthy choices and portions discussed Physical Activity: Exercise 3-5 times per week (arm exercises and other as able with back issues) Medications: take my medication as prescribed Monitoring : test my blood glucose as discussed (note x per day with comment) Reducing Risk: do foot checks daily  Plan:   Patient Instructions  Plan:  Aim for 4 Carb Choices per meal (60 grams) +/- 1 either way  Aim for 0-2 Carbs per snack if hungry  Include protein in moderation with your meals and snacks Consider reading food labels for Total Carbohydrate and Fat Grams of foods Exercise as able with hand weights. Aim for 4-5 times per week. Consider checking BG at alternate times per day as directed by MD  Consider taking medication  as directed by MD      Expected Outcomes:  Demonstrated limited interest in learning.  Expect minimal changes  Education material provided: Living Well with Diabetes, Meal plan card, My Plate and Snack sheet  If problems or questions, patient to contact team via:  Phone and Email  Future DSME appointment: PRN

## 2014-03-12 NOTE — Patient Instructions (Signed)
Plan:  Aim for 4 Carb Choices per meal (60 grams) +/- 1 either way  Aim for 0-2 Carbs per snack if hungry  Include protein in moderation with your meals and snacks Consider reading food labels for Total Carbohydrate and Fat Grams of foods Exercise as able with hand weights. Aim for 4-5 times per week. Consider checking BG at alternate times per day as directed by MD  Consider taking medication  as directed by MD

## 2014-04-26 ENCOUNTER — Other Ambulatory Visit: Payer: Self-pay | Admitting: Internal Medicine

## 2014-04-26 ENCOUNTER — Other Ambulatory Visit: Payer: Self-pay | Admitting: Cardiology

## 2014-05-31 ENCOUNTER — Ambulatory Visit: Payer: Commercial Managed Care - HMO | Admitting: Cardiology

## 2014-06-07 ENCOUNTER — Emergency Department (HOSPITAL_COMMUNITY): Payer: Commercial Managed Care - HMO

## 2014-06-07 ENCOUNTER — Encounter (HOSPITAL_COMMUNITY): Payer: Self-pay | Admitting: Emergency Medicine

## 2014-06-07 ENCOUNTER — Emergency Department (HOSPITAL_COMMUNITY)
Admission: EM | Admit: 2014-06-07 | Discharge: 2014-06-08 | Disposition: A | Discharge status: Home / Self Care | Payer: Commercial Managed Care - HMO | Attending: Emergency Medicine | Admitting: Emergency Medicine

## 2014-06-07 DIAGNOSIS — Z7982 Long term (current) use of aspirin: Secondary | ICD-10-CM | POA: Insufficient documentation

## 2014-06-07 DIAGNOSIS — R55 Syncope and collapse: Secondary | ICD-10-CM | POA: Insufficient documentation

## 2014-06-07 DIAGNOSIS — Z8659 Personal history of other mental and behavioral disorders: Secondary | ICD-10-CM | POA: Insufficient documentation

## 2014-06-07 DIAGNOSIS — R569 Unspecified convulsions: Secondary | ICD-10-CM | POA: Diagnosis not present

## 2014-06-07 DIAGNOSIS — Y9389 Activity, other specified: Secondary | ICD-10-CM | POA: Diagnosis not present

## 2014-06-07 DIAGNOSIS — W1839XA Other fall on same level, initial encounter: Secondary | ICD-10-CM | POA: Diagnosis not present

## 2014-06-07 DIAGNOSIS — Z794 Long term (current) use of insulin: Secondary | ICD-10-CM | POA: Insufficient documentation

## 2014-06-07 DIAGNOSIS — Y998 Other external cause status: Secondary | ICD-10-CM | POA: Diagnosis not present

## 2014-06-07 DIAGNOSIS — S199XXA Unspecified injury of neck, initial encounter: Secondary | ICD-10-CM | POA: Diagnosis not present

## 2014-06-07 DIAGNOSIS — Z720 Tobacco use: Secondary | ICD-10-CM | POA: Diagnosis not present

## 2014-06-07 DIAGNOSIS — E119 Type 2 diabetes mellitus without complications: Secondary | ICD-10-CM | POA: Diagnosis not present

## 2014-06-07 DIAGNOSIS — R4020 Unspecified coma: Secondary | ICD-10-CM

## 2014-06-07 DIAGNOSIS — I252 Old myocardial infarction: Secondary | ICD-10-CM | POA: Diagnosis not present

## 2014-06-07 DIAGNOSIS — Z87828 Personal history of other (healed) physical injury and trauma: Secondary | ICD-10-CM | POA: Diagnosis not present

## 2014-06-07 DIAGNOSIS — Z86718 Personal history of other venous thrombosis and embolism: Secondary | ICD-10-CM | POA: Insufficient documentation

## 2014-06-07 DIAGNOSIS — M1990 Unspecified osteoarthritis, unspecified site: Secondary | ICD-10-CM | POA: Insufficient documentation

## 2014-06-07 DIAGNOSIS — Z79899 Other long term (current) drug therapy: Secondary | ICD-10-CM | POA: Insufficient documentation

## 2014-06-07 DIAGNOSIS — Y9289 Other specified places as the place of occurrence of the external cause: Secondary | ICD-10-CM | POA: Insufficient documentation

## 2014-06-07 LAB — URINE MICROSCOPIC-ADD ON

## 2014-06-07 LAB — CBC WITH DIFFERENTIAL/PLATELET
Basophils Absolute: 0 10*3/uL (ref 0.0–0.1)
Basophils Relative: 0 % (ref 0–1)
Eosinophils Absolute: 0.2 10*3/uL (ref 0.0–0.7)
Eosinophils Relative: 3 % (ref 0–5)
HEMATOCRIT: 40.3 % (ref 39.0–52.0)
Hemoglobin: 13.6 g/dL (ref 13.0–17.0)
LYMPHS PCT: 19 % (ref 12–46)
Lymphs Abs: 1.7 10*3/uL (ref 0.7–4.0)
MCH: 32.6 pg (ref 26.0–34.0)
MCHC: 33.7 g/dL (ref 30.0–36.0)
MCV: 96.6 fL (ref 78.0–100.0)
MONO ABS: 0.6 10*3/uL (ref 0.1–1.0)
MONOS PCT: 7 % (ref 3–12)
NEUTROS PCT: 71 % (ref 43–77)
Neutro Abs: 6.4 10*3/uL (ref 1.7–7.7)
Platelets: 178 10*3/uL (ref 150–400)
RBC: 4.17 MIL/uL — ABNORMAL LOW (ref 4.22–5.81)
RDW: 13.5 % (ref 11.5–15.5)
WBC: 9 10*3/uL (ref 4.0–10.5)

## 2014-06-07 LAB — I-STAT CG4 LACTIC ACID, ED: LACTIC ACID, VENOUS: 2.22 mmol/L — AB (ref 0.5–2.0)

## 2014-06-07 LAB — CBG MONITORING, ED
Glucose-Capillary: 65 mg/dL — ABNORMAL LOW (ref 70–99)
Glucose-Capillary: 108 mg/dL — ABNORMAL HIGH (ref 70–99)

## 2014-06-07 LAB — COMPREHENSIVE METABOLIC PANEL
ALK PHOS: 45 U/L (ref 38–126)
ALT: 15 U/L — ABNORMAL LOW (ref 17–63)
AST: 18 U/L (ref 15–41)
Albumin: 3.8 g/dL (ref 3.5–5.0)
Anion gap: 9 (ref 5–15)
BILIRUBIN TOTAL: 0.7 mg/dL (ref 0.3–1.2)
BUN: 14 mg/dL (ref 6–20)
CALCIUM: 9.8 mg/dL (ref 8.9–10.3)
CO2: 27 mmol/L (ref 22–32)
Chloride: 103 mmol/L (ref 101–111)
Creatinine, Ser: 0.99 mg/dL (ref 0.61–1.24)
GFR calc Af Amer: >60 mL/min (ref >60)
Glucose, Bld: 73 mg/dL (ref 70–99)
POTASSIUM: 4.4 mmol/L (ref 3.5–5.1)
Sodium: 139 mmol/L (ref 135–145)
Total Protein: 7.5 g/dL (ref 6.5–8.1)

## 2014-06-07 LAB — URINALYSIS, ROUTINE W REFLEX MICROSCOPIC
GLUCOSE, UA: 250 mg/dL — AB
HGB URINE DIPSTICK: NEGATIVE
Ketones, ur: 15 mg/dL — AB
Leukocytes, UA: NEGATIVE
NITRITE: NEGATIVE
Protein, ur: 30 mg/dL — AB
Specific Gravity, Urine: 1.03 (ref 1.005–1.030)
Urobilinogen, UA: 1 mg/dL (ref 0.0–1.0)
pH: 5 (ref 5.0–8.0)

## 2014-06-07 LAB — PROTIME-INR
INR: 1.05 (ref 0.00–1.49)
PROTHROMBIN TIME: 13.9 s (ref 11.6–15.2)

## 2014-06-07 LAB — I-STAT TROPONIN, ED: Troponin i, poc: 0 ng/mL (ref 0.00–0.08)

## 2014-06-07 MED ORDER — SODIUM CHLORIDE 0.9 % IV BOLUS (SEPSIS)
1000.0000 mL | Freq: Once | INTRAVENOUS | Status: AC
  Administered 2014-06-07: 1000 mL via INTRAVENOUS

## 2014-06-07 NOTE — ED Notes (Addendum)
Pt given orange and apple juice to due CBG-65.

## 2014-06-07 NOTE — ED Notes (Signed)
Pt presents to ED via EMS for LOC. Per family, started c/o neck pain/stiffing then started shaking and fell. Pt then went into LOC and became combative. Pt arrival covered in in large amount of stool. Pt alerts and oriented at this time. Airway intact. Per EMS, CBG-80, BP-94/60, HR-104. EMS given total of 10mg  Versed. Pt alerts and oriented at this time. Airway intact.

## 2014-06-07 NOTE — ED Provider Notes (Signed)
CSN: [id number]     Arrival date & time 06/07/14  2051 History   First MD Initiated Contact with Patient 06/07/14 2100     Chief Complaint  Patient presents with  . Loss of Consciousness     (Consider location/radiation/quality/duration/timing/severity/associated sxs/prior Treatment) Patient is a 69 y.o. male presenting with seizures. The history is provided by the patient, the EMS personnel and a relative. No language interpreter was used.  Seizures Seizure activity on arrival: no   Seizure type:  Grand mal Preceding symptoms comment:  Neck pain Initial focality:  Diffuse Episode characteristics: abnormal movements, eye deviation, generalized shaking, incontinence and unresponsiveness   Postictal symptoms: confusion and somnolence   Return to baseline: yes   Timing:  Once Progression:  Resolved Context: not alcohol withdrawal, not change in medication, not sleeping less, not drug use, not emotional upset, not fever, medical compliance, not possible hypoglycemia, not possible medication ingestion, not previous head injury and not stress   Recent head injury:  No recent head injuries PTA treatment:  Midazolam History of seizures: no     Past Medical History  Diagnosis Date  . AAA (abdominal aortic aneurysm)   . History of trauma 70    [name] fell on him causing severe back injury  . Diabetes mellitus   . Arthritis   . Anxiety   . Depression   . Myocardial infarction July 2010  . DVT (deep venous thrombosis)    Past Surgical History  Procedure Laterality Date  . Back surgery  1983  . Vasectomy    . Cardiac catheterization      cardiac stents  . Spine surgery     Family History  Problem Relation Age of Onset  . Colon cancer Neg Hx   . Rectal cancer Neg Hx   . Stomach cancer Neg Hx   . Esophageal cancer Neg Hx   . Diabetes Mother   . Hyperlipidemia Mother   . Hypertension Mother   . Heart attack Mother   . Heart disease Father     Before age 54  . Hyperlipidemia  Father   . Hypertension Father   . Heart attack Father   . Diabetes Sister   . Hypertension Sister   . Heart attack Sister   . Cancer Brother     Thyroid  . Hypertension Brother    History  Substance Use Topics  . Smoking status: Current Every Day Smoker -- 0.10 packs/day for 45 years    Types: E-cigarettes  . Smokeless tobacco: Never Used     Comment: trying to quit; has decreased from 2 ppd to 1/2  ppd  . Alcohol Use: No    Review of Systems  Constitutional: Negative for fever and fatigue.  Respiratory: Negative for chest tightness and shortness of breath.   Cardiovascular: Negative for chest pain.  Gastrointestinal: Negative for nausea, vomiting and abdominal pain.  Musculoskeletal: Positive for neck pain.  Neurological: Positive for seizures. Negative for light-headedness and headaches.  Psychiatric/Behavioral: Negative for confusion.  All other systems reviewed and are negative.     Allergies  Novocain  Home Medications   Prior to Admission medications   Medication Sig Start Date End Date Taking? Authorizing Provider  aspirin 81 MG tablet Take 1 tablet (81 mg total) by mouth daily. 05/13/12   Larey Dresser, MD  atorvastatin (LIPITOR) 40 MG tablet TAKE ONE TABLET BY MOUTH ONCE DAILY 04/27/14   Larey Dresser, MD  Blood Glucose Monitoring Suppl (ONE TOUCH ULTRA  MINI) W/DEVICE KIT Use to check blood sugars twice a day Dx 250.00 10/08/13   Hendricks Limes, MD  carvedilol (COREG) 6.25 MG tablet TAKE ONE TABLET BY MOUTH TWICE DAILY WITH MEALS Patient not taking: Reported on 03/12/2014 02/16/14   Larey Dresser, MD  glipiZIDE (GLUCOTROL) 5 MG tablet TAKE ONE TABLET BY MOUTH ONCE DAILY 04/27/14   Janith Lima, MD  glucose blood (ONE TOUCH TEST STRIPS) test strip Use to check blood sugars twice a day Dx 250.00 10/08/13   Hendricks Limes, MD  Insulin Detemir (LEVEMIR) 100 UNIT/ML Pen Inject 30 Units into the skin daily at 10 pm. Patient not taking: Reported on 03/12/2014 02/08/14    Janith Lima, MD  metFORMIN (GLUCOPHAGE) 1000 MG tablet Take 1 tablet (1,000 mg total) by mouth 2 (two) times daily with a meal. 12/30/13   Janith Lima, MD  [facility] DELICA LANCETS 29U MISC Use to check blood sugars twice a day Dx 250.00 10/08/13   Hendricks Limes, MD  ramipril (ALTACE) 2.5 MG capsule TAKE ONE CAPSULE BY MOUTH TWICE DAILY 11/12/13   Larey Dresser, MD   BP 97/49 mmHg  Pulse 86  Temp(Src) 97.7 F (36.5 C) (Oral)  Resp 18  SpO2 98%  Filed Vitals:   06/07/14 2330 06/08/14 0000 06/08/14 0030 06/08/14 0045  BP: 120/53 115/55 113/51 114/53  Pulse: 79 82 79 79  Temp:      TempSrc:      Resp: _0 SpO2: 96% 97% 94% 95%    Physical Exam  Constitutional: He is oriented to person, place, and time. Vital signs are normal. He appears well-developed and well-nourished. He does not appear ill. No distress.  HENT:  Head: Normocephalic and atraumatic.  Nose: Nose normal.  Mouth/Throat: Oropharynx is clear and moist. No oropharyngeal exudate.  No external signs of trauma  Eyes: EOM are normal. Pupils are equal, round, and reactive to light.  Neck: Normal range of motion. Neck supple.  Full ROM, nontender to palpation, no meningeal sx  Cardiovascular: Normal rate, regular rhythm, normal heart sounds and intact distal pulses.   No murmur heard. Pulmonary/Chest: Effort normal and breath sounds normal. No respiratory distress. He has no wheezes. He exhibits no tenderness.  Abdominal: Soft. He exhibits no distension. There is no tenderness. There is no guarding.  Musculoskeletal: Normal range of motion. He exhibits no tenderness.  Full upper and lower extremity ROM.  No gross deformities, nontender. No C/T/L L spine tenderness  Neurological: He is alert and oriented to person, place, and time. No cranial nerve deficit. Coordination normal.  Full strength and sensation of bilateral upper and lower extremities. Normal coordination.    Skin: Skin is warm and dry. He is  not diaphoretic. No pallor.  Psychiatric: He has a normal mood and affect. His behavior is normal. Judgment and thought content normal.  Nursing note and vitals reviewed.   ED Course  Procedures (including critical care time) Labs Review Labs Reviewed  URINE RAPID DRUG SCREEN (HOSP PERFORMED) - Abnormal; Notable for the following:    Benzodiazepines POSITIVE (*)    All other components within normal limits  URINALYSIS, ROUTINE W REFLEX MICROSCOPIC - Abnormal; Notable for the following:    Color, Urine AMBER (*)    APPearance TURBID (*)    Glucose, UA 250 (*)    Bilirubin Urine SMALL (*)    Ketones, ur 15 (*)    Protein, ur 30 (*)  All other components within normal limits  COMPREHENSIVE METABOLIC PANEL - Abnormal; Notable for the following:    ALT 15 (*)    All other components within normal limits  CBC WITH DIFFERENTIAL/PLATELET - Abnormal; Notable for the following:    RBC 4.17 (*)    All other components within normal limits  URINE MICROSCOPIC-ADD ON - Abnormal; Notable for the following:    Squamous Epithelial / LPF FEW (*)    Bacteria, UA FEW (*)    Casts GRANULAR CAST (*)    All other components within normal limits  CBG MONITORING, ED - Abnormal; Notable for the following:    Glucose-Capillary 65 (*)    All other components within normal limits  I-STAT CG4 LACTIC ACID, ED - Abnormal; Notable for the following:    Lactic Acid, Venous 2.22 (*)    All other components within normal limits  CBG MONITORING, ED - Abnormal; Notable for the following:    Glucose-Capillary 108 (*)    All other components within normal limits  ETHANOL  PROTIME-INR  I-STAT TROPOININ, ED    Imaging Review Ct Head Wo Contrast  06/08/2014   CLINICAL DATA:  Loss of consciousness after complaining of neck pain and stiffness.  EXAM: CT HEAD WITHOUT CONTRAST  TECHNIQUE: Contiguous axial images were obtained from the base of the skull through the vertex without intravenous contrast.  COMPARISON:   None.  FINDINGS: There is no intracranial hemorrhage, mass or evidence of acute infarction. There is mild generalized atrophy. There is mild chronic microvascular ischemic change. There is no significant extra-axial fluid collection.  No acute intracranial findings are evident.  There is complete opacification of the right frontal sinus. There is marked membrane thickening throughout the ethmoid air cells.  IMPRESSION: *Mild atrophy and chronic microvascular disease. No acute intracranial findings. 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   Electronically Signed   By: Andreas Newport M.D.   On: 06/08/2014 00:13   Dg Chest Portable 1 View  06/07/2014   CLINICAL DATA:  Loss of consciousness. Cervicalgia/neck pain. Fall.  EXAM: PORTABLE CHEST - 1 VIEW  COMPARISON:  09/22/2012.  FINDINGS: Cardiopericardial silhouette within normal limits. Mediastinal contours normal. Trachea midline. No airspace disease or effusion. Harrington rods are present. Monitoring leads project over the chest. Asymmetric LEFT pleural apical scarring. No displaced rib fracture. Negative for pneumothorax. Skin fold projects over the RIGHT chest.  IMPRESSION: No active disease.   Electronically Signed   By: Dereck Ligas M.D.   On: 06/07/2014 22:30     EKG Interpretation   Date/Time:  Monday Jun 07 2014 21:22:53 EDT Ventricular Rate:  85 PR Interval:  166 QRS Duration: 94 QT Interval:  375 QTC Calculation: 446 R Axis:   85 Text Interpretation:  Sinus rhythm RSR' in V1 or V2, right VCD or RVH  Probable inferior infarct, old No previous tracing Confirmed by Maryan Rued   MD, Loree Fee ([id number]) on 06/07/2014 10:33:34 PM      MDM   Final diagnoses:  New onset seizure  Loss of consciousness   Pt is a 69 yo M with hx of HTN, CAD who presents after a witnessed first time seizure at home.  Reportedly 35 minutes of tonic clonic seizure activity which stopped after EMS arrived and  delivered 10 mg IV versed.  Confusion and aggression post-ictally, then somnolent.  + stool incontinence.    Presented to [facility] and was lethargic on arrival, but stable vitals.  By the time the patient was cleaned up from the loss of bowels, he was fully awake and back at baseline (after only a few minutes after arrival).   Glucose 80 at home, down to 65 here.  Given snacks and improved appropriately.   Now with a nonfocal neuro exam, moving all extremities with full strength, full sensation, normal cognition, no speech deficits.    EMS C-collar on at arrival.  Pt denies any neck pain, is alert/oriented, so the collar was removed. Full ROM, no midline tenderness.    Worked up for first time seizure with labs. EKG, and CT head  CT head negative.  Electrolytes benign.  No historical suggestion of toxic ingestions.  Glucose benign.  Doubt infection, pt is afebrile, no recent illness, and now sx have fully resolved.  Unclear cause of seizure.   As patient is back to baseline, I believe that he is stable for further work up as an outpatient.  Spoke to Dr. Aram Beecham with neurology who agreed that patient could be appropriate for outpatient follow up.  He is seen by [facility] cardiology and internal medicine clinic, so will give the number to the [facility] neurology clinic.  Patient was informed to follow up as soon as possible.  Discussed seizure precautions including avoiding driving until he can be evaluated by neurology clinic.  All questions were answered and ED return precautions were discussed prior to dc home.    Patient was seen with ED Attending, Dr. Dorma Russell, MD   Tori Milks, MD 06/09/14 [place], MD 06/10/14 2135

## 2014-06-08 LAB — RAPID URINE DRUG SCREEN, HOSP PERFORMED
Amphetamines: NOT DETECTED
Barbiturates: NOT DETECTED
Benzodiazepines: POSITIVE — AB
Cocaine: NOT DETECTED
Opiates: NOT DETECTED
Tetrahydrocannabinol: NOT DETECTED

## 2014-06-08 LAB — ETHANOL: Alcohol, Ethyl (B): <5 mg/dL (ref <5)

## 2014-06-18 ENCOUNTER — Encounter: Payer: Self-pay | Admitting: Neurology

## 2014-06-18 ENCOUNTER — Ambulatory Visit
Admission: RE | Admit: 2014-06-18 | Discharge: 2014-06-18 | Disposition: A | Discharge status: Home / Self Care | Payer: Commercial Managed Care - HMO | Source: Ambulatory Visit | Attending: Neurology | Admitting: Neurology

## 2014-06-18 ENCOUNTER — Telehealth: Payer: Self-pay | Admitting: Family Medicine

## 2014-06-18 ENCOUNTER — Ambulatory Visit (INDEPENDENT_AMBULATORY_CARE_PROVIDER_SITE_OTHER): Payer: Commercial Managed Care - HMO | Admitting: Neurology

## 2014-06-18 ENCOUNTER — Ambulatory Visit: Payer: Commercial Managed Care - HMO

## 2014-06-18 VITALS — BP 114/62 | HR 86 | Resp 16 | Wt 140.0 lb

## 2014-06-18 DIAGNOSIS — R569 Unspecified convulsions: Secondary | ICD-10-CM | POA: Diagnosis not present

## 2014-06-18 DIAGNOSIS — M25511 Pain in right shoulder: Secondary | ICD-10-CM

## 2014-06-18 NOTE — Progress Notes (Signed)
NEUROLOGY CONSULTATION NOTE  Arthur Evans. MRN: [id number] DOB: February 10, 1945  Referring provider: Dr. Scarlette Calico Primary care provider: Dr. Scarlette Calico  Reason for consult:  New onset seizure  Dear Dr Ronnald Ramp:  Thank you for your kind referral of Arthur Evans. for consultation of the above symptoms. Although his history is well known to you, please allow me to reiterate it for the purpose of our medical record. The patient was accompanied to the clinic by his stepdaughter who also provides collateral information. Records and images were personally reviewed where available.  HISTORY OF PRESENT ILLNESS: This is a 69 year old right-handed man with a history of hypertension, hyperlipidemia, and diabetes, in his usual state of health until 06/07/14 in the evening. He did not eat anything all day, and recalls making milkshakes for his grandchildren. He then felt funny, like his eyes were jumping. He picked up a can and noticed his left hand was jerking more than the right, then he lost awareness and apparently fell to the floor, witnessed by his stepdaughter to have generalized shaking. He had bowel incontinence. After the episode, which per family lasted 45 minutes, he was not moving his left arm. He was given a total of 10mg  Versed because he was combative after. Per notes, EMS noted CBG of 80, BP 94/60, HR 104. He was back to baseline alert and oriented on arrival to ER. His initial CBG in the ER was 65. They report he was given juice, and repeat level was 108. CBC, CMP, UDS, EtOH level normal. He had a head CT which I personally reviewed, which did not show any acute changes, there was mild atrophy and chronic microvascular disease. Since then, he denies any similar symptoms. They make sure he eats small frequent meals. He has right shoulder and collar bone pain, no focal weakness. He has chronic neck and back pain. He denies any headaches, dizziness, diplopia, dysarthria/dysphagia,  bowel/bladder dysfunction. He denies any olfactory/gustatory hallucinations, deja vu, rising epigastric sensation, focal numbness/tingling/weakness, myoclonic jerks. He had a normal birth and early development.  There is no history of febrile convulsions, CNS infections such as meningitis/encephalitis, significant traumatic brain injury, neurosurgical procedures, or family history of seizures. He denies any alcohol use, sleep is good. He has chronic tobacco use.  PAST MEDICAL HISTORY: Past Medical History  Diagnosis Date  . AAA (abdominal aortic aneurysm)   . History of trauma 71    [name] fell on him causing severe back injury  . Diabetes mellitus   . Arthritis   . Anxiety   . Depression   . Myocardial infarction July 2010  . DVT (deep venous thrombosis)     PAST SURGICAL HISTORY: Past Surgical History  Procedure Laterality Date  . Back surgery  1983  . Vasectomy    . Cardiac catheterization      cardiac stents  . Spine surgery      MEDICATIONS: Current Outpatient Prescriptions on File Prior to Visit  Medication Sig Dispense Refill  . aspirin 81 MG tablet Take 1 tablet (81 mg total) by mouth daily.    Marland Kitchen atorvastatin (LIPITOR) 40 MG tablet TAKE ONE TABLET BY MOUTH ONCE DAILY 30 tablet 1  . carvedilol (COREG) 6.25 MG tablet TAKE ONE TABLET BY MOUTH TWICE DAILY WITH MEALS 60 tablet 3  . glipiZIDE (GLUCOTROL) 5 MG tablet TAKE ONE TABLET BY MOUTH ONCE DAILY 90 tablet 2  . metFORMIN (GLUCOPHAGE) 1000 MG tablet Take 1 tablet (1,000 mg total)  by mouth 2 (two) times daily with a meal. 180 tablet 1  . ramipril (ALTACE) 2.5 MG capsule TAKE ONE CAPSULE BY MOUTH TWICE DAILY 180 capsule 1   No current facility-administered medications on file prior to visit.    ALLERGIES: Allergies  Allergen Reactions  . Novocain [Procaine Hcl] Anxiety    nervousness    FAMILY HISTORY: Family History  Problem Relation Age of Onset  . Colon cancer Neg Hx   . Rectal cancer Neg Hx   . Stomach  cancer Neg Hx   . Esophageal cancer Neg Hx   . Diabetes Mother   . Hyperlipidemia Mother   . Hypertension Mother   . Heart attack Mother   . Heart disease Father     Before age 30  . Hyperlipidemia Father   . Hypertension Father   . Heart attack Father   . Diabetes Sister   . Hypertension Sister   . Heart attack Sister   . Cancer Brother     Thyroid  . Hypertension Brother     SOCIAL HISTORY: History   Social History  . Marital Status: Divorced    Spouse Name: N/A  . Number of Children: 4  . Years of Education: N/A   Occupational History  . Driver    Social History Main Topics  . Smoking status: Current Every Day Smoker -- 0.10 packs/day for 45 years    Types: E-cigarettes  . Smokeless tobacco: Never Used     Comment: trying to quit; has decreased from 2 ppd to 1/2  ppd  . Alcohol Use: No  . Drug Use: No  . Sexual Activity: Not Currently   Other Topics Concern  . Not on file   Social History Narrative    REVIEW OF SYSTEMS: Constitutional: No fevers, chills, or sweats, no generalized fatigue, change in appetite Eyes: No visual changes, double vision, eye pain Ear, nose and throat: No hearing loss, ear pain, nasal congestion, sore throat Cardiovascular: No chest pain, palpitations Respiratory:  No shortness of breath at rest or with exertion, wheezes GastrointestinaI: No nausea, vomiting, diarrhea, abdominal pain, fecal incontinence Genitourinary:  No dysuria, urinary retention or frequency Musculoskeletal:  + neck pain, back pain Integumentary: No rash, pruritus, skin lesions Neurological: as above Psychiatric: No depression, insomnia, anxiety Endocrine: No palpitations, fatigue, diaphoresis, mood swings, change in appetite, change in weight, increased thirst Hematologic/Lymphatic:  No anemia, purpura, petechiae. Allergic/Immunologic: no itchy/runny eyes, nasal congestion, recent allergic reactions, rashes  PHYSICAL EXAM: Filed Vitals:   06/18/14 0919    BP: 114/62  Pulse: 86  Resp: 16   General: No acute distress Head:  Normocephalic/atraumatic Eyes: Fundoscopic exam shows bilateral sharp discs, no vessel changes, exudates, or hemorrhages Neck: supple, no paraspinal tenderness, full range of motion Back: No paraspinal tenderness Heart: regular rate and rhythm Lungs: Clear to auscultation bilaterally. Vascular: No carotid bruits. Skin/Extremities: No rash, no edema Neurological Exam: Mental status: alert and oriented to person, place, and time, no dysarthria or aphasia, Fund of knowledge is appropriate.  Recent and remote memory are intact. 2/3 delayed recall.  Attention and concentration are normal.    Able to name objects and repeat phrases. Cranial nerves: CN I: not tested CN II: pupils equal, round and reactive to light, visual fields intact, fundi unremarkable. CN III, IV, VI:  full range of motion, no nystagmus, no ptosis CN V: facial sensation intact CN VII: upper and lower face symmetric CN VIII: hearing intact to finger rub CN IX, X:  gag intact, uvula midline CN XI: sternocleidomastoid and trapezius muscles intact CN XII: tongue midline Bulk & Tone: normal, no fasciculations. Motor: 5/5 throughout with no pronator drift. Sensation: intact to light touch, cold, pin, vibration and joint position sense.  No extinction to double simultaneous stimulation.  Romberg test positive sway to the right Deep Tendon Reflexes: +2 throughout with slight asymmetry more brisk on right patella, absent ankle jerks, no ankle clonus Plantar responses: downgoing bilaterally Cerebellar: no incoordination on finger to nose, heel to shin. No dysdiadochokinesia Gait: wide-based, hunched down due to chronic back pain, leaning to the right Tremor: none  IMPRESSION: This is a 69 year old right-handed man with a history of hypertension, hyperlipidemia, diabetes, presenting with new onset seizure. There is concern that the seizure was due to  hypoglycemia, however EMS report of CBG was 80, which is not significantly low. They report his usual levels run at 130. 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 MRI brain with and without contrast and routine EEG will be ordered to assess for focal abnormalities that increase risk for recurrent seizures. We discussed that after an initial seizure, unless there are significant risk factors, an abnormal neurological exam, an EEG showing epileptiform abnormalities, and/or abnormal neuroimaging, treatment with an antiepileptic drug is not indicated. We discussed [place] driving restrictions which indicate a patient needs to free of seizures or events of altered awareness for 6 months prior to resuming driving. The patient agreed to comply with these restrictions.  Seizure precautions were discussed which include no driving, no bathing in a tub, no swimming alone, no cooking over an open flame, no operating dangerous machinery, and no activities which may endanger oneself or someone else. He reports continued right shoulder pain, xray will be ordered to rule out dislocation that can occur after a seizure. He will follow-up after the tests.   Thank you for allowing me to participate in the care of this patient. Please do not hesitate to call for any questions or concerns.   Ellouise Newer, M.D.  CC: Dr. Ronnald Ramp

## 2014-06-18 NOTE — Telephone Encounter (Signed)
Left msg on patient's vm notifying him of results. Asked him to give me a call back if he wanted to be set up for physical therapy for his right shoulder pain.

## 2014-06-18 NOTE — Telephone Encounter (Signed)
-----   Message from Cameron Sprang, MD sent at 06/18/2014 12:43 PM EDT ----- Pls let him know no evidence of fracture. If he would like to do PT for the right shoulder, we can send referral if he prefers. Thanks

## 2014-06-18 NOTE — Patient Instructions (Addendum)
1. Schedule MRI brain with and without contrast 2. Routine EEG 3. Right shoulder xray 4. As per [place] driving laws, after a seizure, one should not drive until 6 months seizure-free 5. Follow-up in 1 month

## 2014-06-23 ENCOUNTER — Other Ambulatory Visit: Payer: Self-pay | Admitting: Cardiology

## 2014-06-23 DIAGNOSIS — I6523 Occlusion and stenosis of bilateral carotid arteries: Secondary | ICD-10-CM

## 2014-06-23 DIAGNOSIS — R569 Unspecified convulsions: Secondary | ICD-10-CM | POA: Insufficient documentation

## 2014-06-23 DIAGNOSIS — M25511 Pain in right shoulder: Secondary | ICD-10-CM | POA: Insufficient documentation

## 2014-06-28 ENCOUNTER — Ambulatory Visit (INDEPENDENT_AMBULATORY_CARE_PROVIDER_SITE_OTHER): Payer: Commercial Managed Care - HMO | Admitting: Neurology

## 2014-06-28 DIAGNOSIS — R569 Unspecified convulsions: Secondary | ICD-10-CM | POA: Diagnosis not present

## 2014-06-29 ENCOUNTER — Encounter (HOSPITAL_COMMUNITY): Payer: Self-pay

## 2014-06-29 ENCOUNTER — Ambulatory Visit (HOSPITAL_COMMUNITY)
Admission: RE | Admit: 2014-06-29 | Discharge: 2014-06-29 | Disposition: A | Discharge status: Home / Self Care | Payer: Commercial Managed Care - HMO | Source: Ambulatory Visit | Attending: Neurology | Admitting: Neurology

## 2014-06-29 DIAGNOSIS — E785 Hyperlipidemia, unspecified: Secondary | ICD-10-CM | POA: Insufficient documentation

## 2014-06-29 DIAGNOSIS — E119 Type 2 diabetes mellitus without complications: Secondary | ICD-10-CM | POA: Diagnosis not present

## 2014-06-29 DIAGNOSIS — R569 Unspecified convulsions: Secondary | ICD-10-CM | POA: Insufficient documentation

## 2014-06-29 MED ORDER — GADOBENATE DIMEGLUMINE 529 MG/ML IV SOLN
15.0000 mL | Freq: Once | INTRAVENOUS | Status: AC | PRN
  Administered 2014-06-29: 13 mL via INTRAVENOUS

## 2014-06-30 ENCOUNTER — Telehealth: Payer: Self-pay | Admitting: Neurology

## 2014-06-30 ENCOUNTER — Ambulatory Visit (HOSPITAL_COMMUNITY): Payer: Commercial Managed Care - HMO | Attending: Cardiology

## 2014-06-30 DIAGNOSIS — I6523 Occlusion and stenosis of bilateral carotid arteries: Secondary | ICD-10-CM | POA: Diagnosis not present

## 2014-06-30 NOTE — Telephone Encounter (Signed)
Patient made aware of MR results.  

## 2014-06-30 NOTE — Telephone Encounter (Signed)
-----   Message from [place], DO sent at 06/30/2014  8:46 AM EDT ----- You can let pt know that MRI brain didn't show anything new.  Does have sinusitis and can f/u with PCP for that.

## 2014-07-06 ENCOUNTER — Other Ambulatory Visit: Payer: Self-pay | Admitting: Cardiology

## 2014-07-08 ENCOUNTER — Telehealth: Payer: Self-pay | Admitting: Family Medicine

## 2014-07-08 NOTE — Procedures (Signed)
ELECTROENCEPHALOGRAM REPORT  Date of Study: 06/28/2014  Patient's Name: Arthur Evans. MRN: [id number] Date of Birth: 06-25-1945  Referring Provider: Dr. Ellouise Newer  Clinical History: This is a 69 year old man with new onset seizure.  Medications: Metformin, Glipizide, lipitor, Altace, aspirin  Technical Summary: A multichannel digital EEG recording measured by the international 10-20 system with electrodes applied with paste and impedances below 5000 ohms performed in our laboratory with EKG monitoring in an awake and asleep patient.  Hyperventilation and photic stimulation were performed.  The digital EEG was referentially recorded, reformatted, and digitally filtered in a variety of bipolar and referential montages for optimal display.    Description: The patient is awake and asleep during the recording.  During maximal wakefulness, there is a symmetric, medium voltage 9.5 Hz posterior dominant rhythm that attenuates with eye opening.  The record is symmetric.  During drowsiness and stage I sleep, there is an increase in theta slowing of the background with occasional vertex waves seen.  Hyperventilation and photic stimulation did not elicit any abnormalities.  There were no epileptiform discharges or electrographic seizures seen.    EKG lead was unremarkable.  Impression: This awake and asleep EEG is normal.    Clinical Correlation: A normal EEG does not exclude a clinical diagnosis of epilepsy.  If further clinical questions remain, prolonged EEG may be helpful.  Clinical correlation is advised.   Ellouise Newer, M.D.

## 2014-07-08 NOTE — Telephone Encounter (Signed)
Patient was notified of results.  

## 2014-07-08 NOTE — Telephone Encounter (Signed)
-----   Message from Cameron Sprang, MD sent at 07/08/2014  1:23 PM EDT ----- Pls let patient know EEG is normal. Thanks

## 2014-07-29 ENCOUNTER — Telehealth: Payer: Self-pay | Admitting: *Deleted

## 2014-07-29 NOTE — Telephone Encounter (Signed)
Patient will call to schedule f/u appt and have a1c rechecked

## 2014-07-30 ENCOUNTER — Ambulatory Visit (INDEPENDENT_AMBULATORY_CARE_PROVIDER_SITE_OTHER): Payer: Commercial Managed Care - HMO | Admitting: Neurology

## 2014-07-30 ENCOUNTER — Encounter: Payer: Self-pay | Admitting: Neurology

## 2014-07-30 VITALS — BP 90/56 | HR 94 | Resp 16 | Wt 135.0 lb

## 2014-07-30 DIAGNOSIS — R569 Unspecified convulsions: Secondary | ICD-10-CM | POA: Diagnosis not present

## 2014-07-30 NOTE — Progress Notes (Signed)
NEUROLOGY FOLLOW UP OFFICE NOTE  Arthur Evans Arthur Evans  HISTORY OF PRESENT ILLNESS: I had the pleasure of seeing Arthur Evans in follow-up in the neurology clinic on 07/30/2014.  The patient was last seen 6 weeks ago after a witnessed seizure on 06/07/14. He had not eaten the whole day, and glucose level was initially 80, then 65 in the ER. Records and images were personally reviewed where available.  I personally reviewed MRI brain with and without contrast which did not show any acute changes, there was mild diffuse atrophy and chronic microvascular disease, hippocampi symmetric with no abnormal signal or enhancement. His wake and sleep EEG was normal. He denies any further seizures or seizure-like symptoms. He denies any headaches, dizziness, diplopia, dysarthria/dysphagia, bowel/bladder dysfunction. He denies any olfactory/gustatory hallucinations, deja vu, rising epigastric sensation, focal numbness/tingling/weakness, myoclonic jerks. He does not check his glucose regularly but states they usually run between 115-145, he is now eating small frequent meals.  HPI: This is a 69 yo RH man with a history of hypertension, hyperlipidemia, and diabetes, in his usual state of health until the evening of 06/07/14. He did not eat anything all day, and recalls making milkshakes for his grandchildren. He then felt funny, like his eyes were jumping. He picked up a can and noticed his left hand was jerking more than the right, then he lost awareness and apparently fell to the floor, witnessed by his stepdaughter to have generalized shaking. He had bowel incontinence. After the episode, which per family lasted 45 minutes, he was not moving his left arm. He was given a total of 10mg  Versed because he was combative after. Per notes, EMS noted CBG of 80, BP 94/60, HR 104. He was back to baseline alert and oriented on arrival to ER. His initial CBG in the ER was 65. They report he was given juice, and repeat level  was 108. CBC, CMP, UDS, EtOH level normal. He had a head CT which I personally reviewed, which did not show any acute changes, there was mild atrophy and chronic microvascular disease. He had a normal birth and early development. There is no history of febrile convulsions, CNS infections such as meningitis/encephalitis, significant traumatic brain injury, neurosurgical procedures, or family history of seizures.   PAST MEDICAL HISTORY: Past Medical History  Diagnosis Date  . AAA (abdominal aortic aneurysm)   . History of trauma 8    [name] fell on him causing severe back injury  . Diabetes mellitus   . Arthritis   . Anxiety   . Depression   . Myocardial infarction July 2010  . DVT (deep venous thrombosis)     MEDICATIONS: Current Outpatient Prescriptions on File Prior to Visit  Medication Sig Dispense Refill  . aspirin 81 MG tablet Take 1 tablet (81 mg total) by mouth daily.    Marland Kitchen atorvastatin (LIPITOR) 40 MG tablet TAKE ONE TABLET BY MOUTH ONCE DAILY 30 tablet 0  . carvedilol (COREG) 6.25 MG tablet TAKE ONE TABLET BY MOUTH TWICE DAILY WITH MEALS 60 tablet 0  . glipiZIDE (GLUCOTROL) 5 MG tablet TAKE ONE TABLET BY MOUTH ONCE DAILY 90 tablet 2  . metFORMIN (GLUCOPHAGE) 1000 MG tablet Take 1 tablet (1,000 mg total) by mouth 2 (two) times daily with a meal. 180 tablet 1  . ramipril (ALTACE) 2.5 MG capsule TAKE ONE CAPSULE BY MOUTH TWICE DAILY 180 capsule 1   No current facility-administered medications on file prior to visit.    ALLERGIES: Allergies  Allergen Reactions  . Novocain [Procaine Hcl] Anxiety    nervousness    FAMILY HISTORY: Family History  Problem Relation Age of Onset  . Colon cancer Neg Hx   . Rectal cancer Neg Hx   . Stomach cancer Neg Hx   . Esophageal cancer Neg Hx   . Diabetes Mother   . Hyperlipidemia Mother   . Hypertension Mother   . Heart attack Mother   . Heart disease Father     Before age 6  . Hyperlipidemia Father   . Hypertension Father   .  Heart attack Father   . Diabetes Sister   . Hypertension Sister   . Heart attack Sister   . Cancer Brother     Thyroid  . Hypertension Brother     SOCIAL HISTORY: History   Social History  . Marital Status: Divorced    Spouse Name: N/A  . Number of Children: 4  . Years of Education: N/A   Occupational History  . Driver    Social History Main Topics  . Smoking status: Current Every Day Smoker -- 0.10 packs/day for 45 years    Types: E-cigarettes  . Smokeless tobacco: Never Used     Comment: trying to quit; has decreased from 2 ppd to 1/2  ppd  . Alcohol Use: No  . Drug Use: No  . Sexual Activity: Not Currently   Other Topics Concern  . Not on file   Social History Narrative    REVIEW OF SYSTEMS: Constitutional: No fevers, chills, or sweats, no generalized fatigue, change in appetite Eyes: No visual changes, double vision, eye pain Ear, nose and throat: No hearing loss, ear pain, nasal congestion, sore throat Cardiovascular: No chest pain, palpitations Respiratory:  No shortness of breath at rest or with exertion, wheezes GastrointestinaI: No nausea, vomiting, diarrhea, abdominal pain, fecal incontinence Genitourinary:  No dysuria, urinary retention or frequency Musculoskeletal:  + neck pain, back pain Integumentary: No rash, pruritus, skin lesions Neurological: as above Psychiatric: No depression, insomnia, anxiety Endocrine: No palpitations, fatigue, diaphoresis, mood swings, change in appetite, change in weight, increased thirst Hematologic/Lymphatic:  No anemia, purpura, petechiae. Allergic/Immunologic: no itchy/runny eyes, nasal congestion, recent allergic reactions, rashes  PHYSICAL EXAM: Filed Vitals:   07/30/14 0838  BP: 90/56  Pulse: 94  Resp: 16   General: No acute distress Head:  Normocephalic/atraumatic Neck: supple, no paraspinal tenderness, full range of motion Heart:  Regular rate and rhythm Lungs:  Clear to auscultation bilaterally Back:  No paraspinal tenderness Skin/Extremities: No rash, no edema Neurological Exam: alert and oriented to person, place, and time. No aphasia or dysarthria. Fund of knowledge is appropriate.  Recent and remote memory are intact.  Attention and concentration are normal.    Able to name objects and repeat phrases. Cranial nerves: Pupils equal, round, reactive to light.  Fundoscopic exam unremarkable, no papilledema. Extraocular movements intact with no nystagmus. Visual fields full. Facial sensation intact. No facial asymmetry. Tongue, uvula, palate midline.  Motor: Bulk and tone normal, muscle strength 5/5 throughout with no pronator drift.  Sensation to light touch intact.  No extinction to double simultaneous stimulation.  Deep tendon reflexes 1+ throughout, toes downgoing.  Finger to nose testing intact.  Gait narrow-based and steady, able to tandem walk adequately.  Romberg negative.  IMPRESSION: This is a 69 yo RH man with a history of hypertension, hyperlipidemia, diabetes, who had a seizure last 06/07/14. There is concern that the seizure was due to hypoglycemia, however EMS report  of CBG was 80, which is not significantly low. It was 65 in the ER. They report his usual levels run at 130. 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 MRI brain with and without contrast and routine EEG normal. We again discussed that after an initial seizure, unless there are significant risk factors, an abnormal neurological exam, an EEG showing epileptiform abnormalities, and/or abnormal neuroimaging, treatment with an antiepileptic drug is not indicated. He feels seizure was due to hypoglycemia and is now eating small frequent meals. He knows to call our office in the event of another seizure, at which point further testing can be done such as prolonged EEG. We discussed [place] driving restrictions which indicate a patient needs to free of seizures or events of altered  awareness for 6 months prior to resuming driving. He will follow-up in 5 months and knows to call our office for any changes.  Thank you for allowing me to participate in his care.  Please do not hesitate to call for any questions or concerns.  The duration of this appointment visit was 15 minutes of face-to-face time with the patient.  Greater than 50% of this time was spent in counseling, explanation of diagnosis, planning of further management, and coordination of care.   Ellouise Newer, M.D.   CC: Dr. Ronnald Ramp

## 2014-07-30 NOTE — Patient Instructions (Signed)
1. Make sure you get small frequent meals and monitor blood sugar regularly 2. As per [place] driving laws, one should not drive after a seizure, until 6 months seizure-free 3. Follow-up in 5 months, call for any changes

## 2014-08-03 ENCOUNTER — Ambulatory Visit (INDEPENDENT_AMBULATORY_CARE_PROVIDER_SITE_OTHER): Payer: Commercial Managed Care - HMO | Admitting: Internal Medicine

## 2014-08-03 ENCOUNTER — Other Ambulatory Visit (INDEPENDENT_AMBULATORY_CARE_PROVIDER_SITE_OTHER): Payer: Commercial Managed Care - HMO

## 2014-08-03 ENCOUNTER — Encounter: Payer: Self-pay | Admitting: Internal Medicine

## 2014-08-03 VITALS — BP 120/66 | HR 84 | Temp 97.6°F | Resp 16 | Ht 69.0 in | Wt 137.0 lb

## 2014-08-03 DIAGNOSIS — E1165 Type 2 diabetes mellitus with hyperglycemia: Secondary | ICD-10-CM

## 2014-08-03 DIAGNOSIS — IMO0002 Reserved for concepts with insufficient information to code with codable children: Secondary | ICD-10-CM

## 2014-08-03 DIAGNOSIS — I714 Abdominal aortic aneurysm, without rupture, unspecified: Secondary | ICD-10-CM

## 2014-08-03 DIAGNOSIS — E118 Type 2 diabetes mellitus with unspecified complications: Secondary | ICD-10-CM | POA: Diagnosis not present

## 2014-08-03 LAB — MICROALBUMIN / CREATININE URINE RATIO
Creatinine,U: 234.4 mg/dL
MICROALB UR: 1.3 mg/dL (ref 0.0–1.9)
MICROALB/CREAT RATIO: 0.6 mg/g (ref 0.0–30.0)

## 2014-08-03 LAB — URINALYSIS, ROUTINE W REFLEX MICROSCOPIC
Bilirubin Urine: NEGATIVE
HGB URINE DIPSTICK: NEGATIVE
Leukocytes, UA: NEGATIVE
NITRITE: NEGATIVE
Specific Gravity, Urine: 1.025 (ref 1.000–1.030)
UROBILINOGEN UA: 2 — AB (ref 0.0–1.0)
Urine Glucose: 500 — AB
pH: 6 (ref 5.0–8.0)

## 2014-08-03 LAB — BASIC METABOLIC PANEL
BUN: 20 mg/dL (ref 6–23)
CALCIUM: 9.1 mg/dL (ref 8.4–10.5)
CO2: 28 mEq/L (ref 19–32)
CREATININE: 1.24 mg/dL (ref 0.40–1.50)
Chloride: 105 mEq/L (ref 96–112)
GFR: 61.18 mL/min (ref >60.00)
GLUCOSE: 171 mg/dL — AB (ref 70–99)
Potassium: 4.6 mEq/L (ref 3.5–5.1)
Sodium: 139 mEq/L (ref 135–145)

## 2014-08-03 LAB — HEMOGLOBIN A1C: HEMOGLOBIN A1C: 7.2 % — AB (ref 4.6–6.5)

## 2014-08-03 NOTE — Progress Notes (Signed)
Subjective:  Patient ID: Arthur Evans., male    DOB: 08/12/1945  Age: 69 y.o. MRN: [id number]  CC: Diabetes   HPI Arthur Evans. presents for a follow up on DM2 - he feels well and offers no complaints.  Outpatient Prescriptions Prior to Visit  Medication Sig Dispense Refill  . aspirin 81 MG tablet Take 1 tablet (81 mg total) by mouth daily.    Marland Kitchen atorvastatin (LIPITOR) 40 MG tablet TAKE ONE TABLET BY MOUTH ONCE DAILY 30 tablet 0  . carvedilol (COREG) 6.25 MG tablet TAKE ONE TABLET BY MOUTH TWICE DAILY WITH MEALS 60 tablet 0  . glipiZIDE (GLUCOTROL) 5 MG tablet TAKE ONE TABLET BY MOUTH ONCE DAILY 90 tablet 2  . metFORMIN (GLUCOPHAGE) 1000 MG tablet Take 1 tablet (1,000 mg total) by mouth 2 (two) times daily with a meal. 180 tablet 1  . ramipril (ALTACE) 2.5 MG capsule TAKE ONE CAPSULE BY MOUTH TWICE DAILY 180 capsule 1   No facility-administered medications prior to visit.    ROS Review of Systems  Constitutional: Negative.  Negative for fever, chills, diaphoresis, appetite change and fatigue.  HENT: Negative.   Eyes: Negative.   Respiratory: Negative.  Negative for cough, choking, chest tightness, shortness of breath and stridor.   Cardiovascular: Negative.  Negative for chest pain, palpitations and leg swelling.  Gastrointestinal: Negative.  Negative for nausea, vomiting, abdominal pain, diarrhea, constipation and blood in stool.  Endocrine: Negative.  Negative for polydipsia, polyphagia and polyuria.  Genitourinary: Negative.   Musculoskeletal: Negative.  Negative for myalgias, back pain, joint swelling and arthralgias.  Skin: Negative.  Negative for rash.  Allergic/Immunologic: Negative.   Neurological: Negative.  Negative for dizziness, tremors, seizures, syncope, speech difficulty, light-headedness, numbness and headaches.  Hematological: Negative.  Negative for adenopathy. Does not bruise/bleed easily.  Psychiatric/Behavioral: Negative.     Objective:  BP  120/66 mmHg  Pulse 84  Temp(Src) 97.6 F (36.4 C) (Oral)  Ht 5\' 9"  (1.753 m)  Wt 137 lb (62.143 kg)  BMI 20.22 kg/m2  SpO2 97%  BP Readings from Last 3 Encounters:  08/03/14 120/66  07/30/14 90/56  06/18/14 114/62    Wt Readings from Last 3 Encounters:  08/03/14 137 lb (62.143 kg)  07/30/14 135 lb (61.236 kg)  06/29/14 143 lb (64.864 kg)    Physical Exam  Constitutional: He is oriented to person, place, and time. He appears well-developed and well-nourished. No distress.  HENT:  Head: Normocephalic and atraumatic.  Mouth/Throat: Oropharynx is clear and moist. No oropharyngeal exudate.  Eyes: Conjunctivae are normal. Right eye exhibits no discharge. Left eye exhibits no discharge. No scleral icterus.  Neck: Normal range of motion. Neck supple. No JVD present. No tracheal deviation present. No thyromegaly present.  Cardiovascular: Normal rate, regular rhythm, normal heart sounds and intact distal pulses.  Exam reveals no gallop and no friction rub.   No murmur heard. Pulmonary/Chest: Effort normal and breath sounds normal. No stridor. No respiratory distress. He has no wheezes. He has no rales. He exhibits no tenderness.  Abdominal: Soft. Bowel sounds are normal. He exhibits no distension and no mass. There is no tenderness. There is no rebound and no guarding.  Musculoskeletal: Normal range of motion. He exhibits no edema or tenderness.  Lymphadenopathy:    He has no cervical adenopathy.  Neurological: He is oriented to person, place, and time.  Skin: Skin is warm and dry. No rash noted. He is not diaphoretic. No erythema. No pallor.  Vitals reviewed.   Lab Results  Component Value Date   WBC 9.0 06/07/2014   HGB 13.6 06/07/2014   HCT 40.3 06/07/2014   PLT 178 06/07/2014   GLUCOSE 171* 08/03/2014   CHOL 175 02/08/2014   TRIG 132.0 02/08/2014   HDL 39.50 02/08/2014   LDLDIRECT 181.8 11/08/2011   LDLCALC 109* 02/08/2014   ALT 15* 06/07/2014   AST 18 06/07/2014   NA  139 08/03/2014   K 4.6 08/03/2014   CL 105 08/03/2014   CREATININE 1.24 08/03/2014   BUN 20 08/03/2014   CO2 28 08/03/2014   TSH 1.21 02/08/2014   PSA 0.78 06/03/2013   INR 1.05 06/07/2014   HGBA1C 7.2* 08/03/2014   MICROALBUR 1.3 08/03/2014    Mr Brain W Wo Contrast  06/29/2014   CLINICAL DATA:  New onset seizure. Hypertension hyperlipidemia and diabetes.  EXAM: MRI HEAD WITHOUT AND WITH CONTRAST  TECHNIQUE: Multiplanar, multiecho pulse sequences of the brain and surrounding structures were obtained without and with intravenous contrast.  CONTRAST:  63mL MULTIHANCE GADOBENATE DIMEGLUMINE 529 MG/ML IV SOLN  COMPARISON:  CT head 06/07/2014  FINDINGS: Mild to moderate atrophy. Mild ventricular enlargement consistent with the degree of atrophy.  Pituitary normal in size.  Craniocervical junction normal.  Sinusitis with mucosal edema throughout the paranasal sinuses. Small air-fluid level left maxillary sinus.  Negative for acute infarct.  Mild chronic microvascular ischemic change in the white matter. Brainstem and cerebellum intact  Negative for hemorrhage or mass.  Medial temporal lobe is normal without evidence of mesial temporal lobe sclerosis.  Postcontrast imaging reveals normal enhancement. No enhancing mass. Leptomeningeal enhancement is normal.  IMPRESSION: Atrophy and chronic microvascular ischemia.  No acute abnormality  Sinusitis with air-fluid level   Electronically Signed   By: Franchot Gallo M.D.   On: 06/29/2014 16:43    Assessment & Plan:   Arthur Evans was seen today for diabetes.  Diagnoses and all orders for this visit:  Abdominal aortic aneurysm - he is due for an annual follow up on this Orders: -     Ambulatory referral to Vascular Surgery  DM (diabetes mellitus), type 2, uncontrolled with complications - his blood sugars are well controlled Orders: -     Basic metabolic panel; Future -     Hemoglobin A1c; Future -     Urinalysis, Routine w reflex microscopic (not at  [facility]); Future -     Microalbumin / creatinine urine ratio; Future   I am having Arthur Evans maintain his aspirin, ramipril, metFORMIN, glipiZIDE, carvedilol, and atorvastatin.  No orders of the defined types were placed in this encounter.     Follow-up: Return in about 4 months (around 12/03/2014).  Scarlette Calico, MD

## 2014-08-03 NOTE — Patient Instructions (Signed)

## 2014-08-03 NOTE — Progress Notes (Signed)
Pre visit review using our clinic review tool, if applicable. No additional management support is needed unless otherwise documented below in the visit note. 

## 2014-08-04 ENCOUNTER — Encounter: Payer: Self-pay | Admitting: Internal Medicine

## 2014-08-06 DIAGNOSIS — G40909 Epilepsy, unspecified, not intractable, without status epilepticus: Secondary | ICD-10-CM

## 2014-08-06 HISTORY — DX: Epilepsy, unspecified, not intractable, without status epilepticus: G40.909

## 2014-08-09 ENCOUNTER — Other Ambulatory Visit: Payer: Self-pay | Admitting: Cardiology

## 2014-08-12 ENCOUNTER — Encounter: Payer: Self-pay | Admitting: Cardiology

## 2014-08-12 ENCOUNTER — Ambulatory Visit: Payer: Commercial Managed Care - HMO | Admitting: Neurology

## 2014-08-12 ENCOUNTER — Ambulatory Visit (INDEPENDENT_AMBULATORY_CARE_PROVIDER_SITE_OTHER): Payer: Commercial Managed Care - HMO | Admitting: Cardiology

## 2014-08-12 VITALS — BP 124/56 | HR 75 | Ht 69.0 in | Wt 135.0 lb

## 2014-08-12 DIAGNOSIS — Z720 Tobacco use: Secondary | ICD-10-CM

## 2014-08-12 DIAGNOSIS — I2510 Atherosclerotic heart disease of native coronary artery without angina pectoris: Secondary | ICD-10-CM

## 2014-08-12 DIAGNOSIS — F17200 Nicotine dependence, unspecified, uncomplicated: Secondary | ICD-10-CM

## 2014-08-12 DIAGNOSIS — I6523 Occlusion and stenosis of bilateral carotid arteries: Secondary | ICD-10-CM

## 2014-08-12 MED ORDER — ATORVASTATIN CALCIUM 80 MG PO TABS: 80.0000 mg | ORAL_TABLET | Freq: Every day | ORAL | Status: DC

## 2014-08-12 NOTE — Patient Instructions (Addendum)
Medication Instructions:   START TAKING ATORVASTATIN 80 MG ONCE A DAY    Labwork:  LFT AND LIPIDS IN 2 MONTHS    Testing/Procedures:   Your physician has requested that you have a carotid duplex. This test is an ultrasound of the carotid arteries in your neck. It looks at blood flow through these arteries that supply the brain with blood. Allow one hour for this exam. There are no restrictions or special instructions.     Follow-Up:   Your physician wants you to follow-up in:  [place] will receive a reminder letter in the mail two months in advance. If you don't receive a letter, please call our office to schedule the follow-up appointment.    Any Other Special Instructions Will Be Listed Below (If Applicable).

## 2014-08-13 NOTE — Progress Notes (Signed)
Patient ID: Arthur Garis., male   DOB: Jun 14, 1945, 69 y.o.   MRN: [id number] PCP: Dr. Ronnald Ramp  69 yo with history of diabetes and smoking presents for cardiology followup. He was in [place] in 7/11 when he developed severe chest pain radiating to his neck and was admitted to [facility] with inferior MI. He had a subtotal RCA occlusion and had 2 Xience DES to the RCA. EF was 50% by LV-gram and echo. Given residual moderate coronary disease in the left system, patient had Lexiscan myoview to assess for ischemia. This showed a fixed basal to mid inferior defect with no ishemia. Repeat echo in 9/11 showed EF 55-60%.   Since last appointment, patient is doing well. He is taking all his meds. He still smokes 3/4 ppd (has cut back). He had side effects with Chantix.  He denies chest pain with exertion. Mild dyspnea walking up a flight of stairs. 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  Able to chop a load a firewood yesterday with no problems.  No claudication.  Main limitation comes from chronic back pain.  BP controlled.    Labs (7/11): creatinine 0.87, TnI 8.89  Labs (9/11): LDL 74, HDL 34, LFTs normal  Labs (3/12): LDL 121 Labs (2/14): LDL 85, HDl 42 Labs (8/14): K 4.6, creatinine 1.0 Labs (10/14): LDL 74, HDL 45 Labs (1/16): LDL 109, HDl 39.5 Labs (6/16): K 4.6, creatinine 1.24  Allergies:  1) ! Novocain   Past Medical History:  1. AAA: Followed by [facility], 3.5 x 3.6 by [place] in 6/13, 3.5 x 3.3 by [place] in 6/14.   2. CAD: Inferior MI 7/11, hospitalized at [facility] in [place]. LHC: EF 50% with mild inferior hypokinesis, 70% calcified mid LAD, nearly occluded proximal RCA, 60% mid and distal CFX. Patient had 3.0 x 28 and 3.0 x 18 Xience DES in RCA. Echo (7/11): Mild LVH, EF 50% with inferior mild hypokinesis, RV normal. Lexiscan myoview (8/11) done due to residual moderate disease in the left system showed EF 59% with old basal to mid inferior MI and no ischemia. Repeat echo (9/11) showed EF 55-60%,  basal inferior hypokinesis, mild biatrial enlargement.  Echo (5/15) with EF 55-60%, mild LVH, valves ok.  3. Smoker: Failed Chantix.  4. Diabetes: Poorly controlled in the past  5. History of back surgery, disabled from back pain.  6. Back problems  7. Hyperlipidemia  8. Allergic rhinitis 9. Carotid stenosis: Carotid dopplers (10/14) with 60-79% RICA.  Carotid dopplers (5/15) with 40-59% RICA stenosis.  10. Seizure disorder  Family History:  Father with MI at 27, Mother with MI at 28, sister and brother both with MIs in 18s   Social History:  Lives with son in [place]. Disabled from back pain. 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 Smokes 1/2 ppd. Alcohol use-no  Drug use-no   ROS: All systems reviewed and negative except as per HPI.   Current Outpatient Prescriptions  Medication Sig Dispense Refill  . aspirin 81 MG tablet Take 1 tablet (81 mg total) by mouth daily.    Marland Kitchen atorvastatin (LIPITOR) 80 MG tablet Take 1 tablet (80 mg total) by mouth daily. 30 tablet 6  . carvedilol (COREG) 6.25 MG tablet TAKE ONE TABLET BY MOUTH TWICE DAILY WITH MEALS. 60 tablet 3  . glipiZIDE (GLUCOTROL) 5 MG tablet TAKE ONE TABLET BY MOUTH ONCE DAILY 90 tablet 2  . metFORMIN (GLUCOPHAGE) 1000 MG tablet Take 1 tablet (1,000 mg total) by mouth 2 (  two) times daily with a meal. 180 tablet 1  . ramipril (ALTACE) 2.5 MG capsule TAKE ONE CAPSULE BY MOUTH TWICE DAILY 180 capsule 1   No current facility-administered medications for this visit.   BP 124/56 mmHg  Pulse 75  Ht 5\' 9"  (1.753 m)  Wt 135 lb (61.236 kg)  BMI 19.93 kg/m2  SpO2 96% General: NAD Neck: No JVD, no thyromegaly or thyroid nodule.  Lungs: Clear to auscultation bilaterally with normal respiratory effort. CV: Nondisplaced PMI.  Heart regular S1/S2, no S3/S4, 2/6 HSM apex.  No peripheral edema.  Right carotid bruit.  Pedal pulses difficult to palpate.   Abdomen: Soft, nontender, no hepatosplenomegaly, no distention.  Neurologic: Alert and  oriented x 3.  Psych: Normal affect. Extremities: No clubbing or cyanosis.   Assessment/Plan: 1. CAD: Stable with no ischemic symptoms.  Continue ASA 81 mg daily, statin, ramipril, Coreg.  2. Smoking: I strongly encouraged him to quit.  Wellbutrin did not work and he had side effects from Chantix.  ?Try nicotine patch. 3. Hyperlipidemia: LDL too high in 1/16, will incrase atorvastatin to 80 mg daily with lipids/LFTs in 2 months.   4. AAA: Followed yearly at [facility], he will make sure he has this set up.  5. Carotid stenosis: RICA stenosis.  Will arrange followup carotid dopplers, he is due.    Followup in 1 year.   Loralie Champagne 08/13/2014

## 2014-08-23 ENCOUNTER — Encounter (HOSPITAL_COMMUNITY): Payer: Commercial Managed Care - HMO

## 2014-08-23 ENCOUNTER — Encounter: Payer: Self-pay | Admitting: Family

## 2014-08-25 ENCOUNTER — Other Ambulatory Visit: Payer: Self-pay | Admitting: Family

## 2014-08-25 DIAGNOSIS — I714 Abdominal aortic aneurysm, without rupture, unspecified: Secondary | ICD-10-CM

## 2014-08-26 ENCOUNTER — Ambulatory Visit: Payer: Medicare Other | Admitting: Family

## 2014-08-26 ENCOUNTER — Other Ambulatory Visit (HOSPITAL_COMMUNITY): Payer: Commercial Managed Care - HMO

## 2014-08-27 ENCOUNTER — Encounter (HOSPITAL_COMMUNITY): Payer: Commercial Managed Care - HMO

## 2014-09-15 ENCOUNTER — Other Ambulatory Visit: Payer: Self-pay | Admitting: Cardiology

## 2014-10-06 ENCOUNTER — Encounter: Payer: Self-pay | Admitting: Family

## 2014-10-07 ENCOUNTER — Encounter: Payer: Self-pay | Admitting: Family

## 2014-10-07 ENCOUNTER — Ambulatory Visit (INDEPENDENT_AMBULATORY_CARE_PROVIDER_SITE_OTHER): Payer: Commercial Managed Care - HMO | Admitting: Family

## 2014-10-07 ENCOUNTER — Ambulatory Visit (HOSPITAL_COMMUNITY)
Admission: RE | Admit: 2014-10-07 | Discharge: 2014-10-07 | Disposition: A | Discharge status: Home / Self Care | Payer: Commercial Managed Care - HMO | Source: Ambulatory Visit | Attending: Family | Admitting: Family

## 2014-10-07 VITALS — BP 111/68 | HR 90 | Temp 97.9°F | Resp 16 | Ht 69.0 in | Wt 126.0 lb

## 2014-10-07 DIAGNOSIS — I714 Abdominal aortic aneurysm, without rupture, unspecified: Secondary | ICD-10-CM

## 2014-10-07 DIAGNOSIS — E119 Type 2 diabetes mellitus without complications: Secondary | ICD-10-CM | POA: Insufficient documentation

## 2014-10-07 DIAGNOSIS — F17200 Nicotine dependence, unspecified, uncomplicated: Secondary | ICD-10-CM

## 2014-10-07 DIAGNOSIS — Z720 Tobacco use: Secondary | ICD-10-CM

## 2014-10-07 NOTE — Patient Instructions (Signed)
Abdominal Aortic Aneurysm An aneurysm is a weakened or damaged part of an artery wall that bulges from the normal force of blood pumping through the body. An abdominal aortic aneurysm is an aneurysm that occurs in the lower part of the aorta, the main artery of the body.  The major concern with an abdominal aortic aneurysm is that it can enlarge and burst (rupture) or blood can flow between the layers of the wall of the aorta through a tear (aorticdissection). Both of these conditions can cause bleeding inside the body and can be life threatening unless diagnosed and treated promptly. CAUSES  The exact cause of an abdominal aortic aneurysm is unknown. Some contributing factors are:   A hardening of the arteries caused by the buildup of fat and other substances in the lining of a blood vessel (arteriosclerosis).  Inflammation of the walls of an artery (arteritis).   Connective tissue diseases, such as Marfan syndrome.   Abdominal trauma.   An infection, such as syphilis or staphylococcus, in the wall of the aorta (infectious aortitis) caused by bacteria. RISK FACTORS  Risk factors that contribute to an abdominal aortic aneurysm may include:  Age older than 60 years.   High blood pressure (hypertension).  Male gender.  Ethnicity (white race).  Obesity.  Family history of aneurysm (first degree relatives only).  Tobacco use. PREVENTION  The following healthy lifestyle habits may help decrease your risk of abdominal aortic aneurysm:  Quitting smoking. Smoking can raise your blood pressure and cause arteriosclerosis.  Limiting or avoiding alcohol.  Keeping your blood pressure, blood sugar level, and cholesterol levels within normal limits.  Decreasing your salt intake. In somepeople, too much salt can raise blood pressure and increase your risk of abdominal aortic aneurysm.  Eating a diet low in saturated fats and cholesterol.  Increasing your fiber intake by including  whole grains, vegetables, and fruits in your diet. Eating these foods may help lower blood pressure.  Maintaining a healthy weight.  Staying physically active and exercising regularly. SYMPTOMS  The symptoms of abdominal aortic aneurysm may vary depending on the size and rate of growth of the aneurysm.Most grow slowly and do not have any symptoms. When symptoms do occur, they may include:  Pain (abdomen, side, lower back, or groin). The pain may vary in intensity. A sudden onset of severe pain may indicate that the aneurysm has ruptured.  Feeling full after eating only small amounts of food.  Nausea or vomiting or both.  Feeling a pulsating lump in the abdomen.  Feeling faint or passing out. DIAGNOSIS  Since most unruptured abdominal aortic aneurysms have no symptoms, they are often discovered during diagnostic exams for other conditions. An aneurysm may be found during the following procedures:  Ultrasonography (A one-time screening for abdominal aortic aneurysm by ultrasonography is also recommended for all men aged 65-75 years who have ever smoked).  X-ray exams.  A computed tomography (CT).  Magnetic resonance imaging (MRI).  Angiography or arteriography. TREATMENT  Treatment of an abdominal aortic aneurysm depends on the size of your aneurysm, your age, and risk factors for rupture. Medication to control blood pressure and pain may be used to manage aneurysms smaller than 6 cm. Regular monitoring for enlargement may be recommended by your caregiver if:  The aneurysm is 3-4 cm in size (an annual ultrasonography may be recommended).  The aneurysm is 4-4.5 cm in size (an ultrasonography every 6 months may be recommended).  The aneurysm is larger than 4.5 cm in   size (your caregiver may ask that you be examined by a vascular surgeon). If your aneurysm is larger than 6 cm, surgical repair may be recommended. There are two main methods for repair of an aneurysm:   Endovascular  repair (a minimally invasive surgery). This is done most often.  Open repair. This method is used if an endovascular repair is not possible. Document Released: 11/01/2004 Document Revised: 05/19/2012 Document Reviewed: 02/22/2012 ExitCare Patient Information 2015 [employer]. This information is not intended to replace advice given to you by your health care provider. Make sure you discuss any questions you have with your health care provider.   Smoking Cessation Quitting smoking is important to your health and has many advantages. However, it is not always easy to quit since nicotine is a very addictive drug. Oftentimes, people try 3 times or more before being able to quit. This document explains the best ways for you to prepare to quit smoking. Quitting takes hard work and a lot of effort, but you can do it. ADVANTAGES OF QUITTING SMOKING  You will live longer, feel better, and live better.  Your body will feel the impact of quitting smoking almost immediately.  Within 20 minutes, blood pressure decreases. Your pulse returns to its normal level.  After 8 hours, carbon monoxide levels in the blood return to normal. Your oxygen level increases.  After 24 hours, the chance of having a heart attack starts to decrease. Your breath, hair, and body stop smelling like smoke.  After 48 hours, damaged nerve endings begin to recover. Your sense of taste and smell improve.  After 72 hours, the body is virtually free of nicotine. Your bronchial tubes relax and breathing becomes easier.  After 2 to 12 weeks, lungs can hold more air. Exercise becomes easier and circulation improves.  The risk of having a heart attack, stroke, cancer, or lung disease is greatly reduced.  After 1 year, the risk of coronary heart disease is cut in half.  After 5 years, the risk of stroke falls to the same as a nonsmoker.  After 10 years, the risk of lung cancer is cut in half and the risk of other cancers  decreases significantly.  After 15 years, the risk of coronary heart disease drops, usually to the level of a nonsmoker.  If you are pregnant, quitting smoking will improve your chances of having a healthy baby.  The people you live with, especially any children, will be healthier.  You will have extra money to spend on things other than cigarettes. QUESTIONS TO THINK ABOUT BEFORE ATTEMPTING TO QUIT You may want to talk about your answers with your health care provider.  Why do you want to quit?  If you tried to quit in the past, what helped and what did not?  What will be the most difficult situations for you after you quit? How will you plan to handle them?  Who can help you through the tough times? Your family? Friends? A health care provider?  What pleasures do you get from smoking? What ways can you still get pleasure if you quit? Here are some questions to ask your health care provider:  How can you help me to be successful at quitting?  What medicine do you think would be best for me and how should I take it?  What should I do if I need more help?  What is smoking withdrawal like? How can I get information on withdrawal? GET READY  Set a quit   date.  Change your environment by getting rid of all cigarettes, ashtrays, matches, and lighters in your home, car, or work. Do not let people smoke in your home.  Review your past attempts to quit. Think about what worked and what did not. GET SUPPORT AND ENCOURAGEMENT You have a better chance of being successful if you have help. You can get support in many ways.  Tell your family, friends, and coworkers that you are going to quit and need their support. Ask them not to smoke around you.  Get individual, group, or telephone counseling and support. Programs are available at local hospitals and health centers. Call your local health department for information about programs in your area.  Spiritual beliefs and practices may  help some smokers quit.  Download a "quit meter" on your computer to keep track of quit statistics, such as how long you have gone without smoking, cigarettes not smoked, and money saved.  Get a self-help book about quitting smoking and staying off tobacco. LEARN NEW SKILLS AND BEHAVIORS  Distract yourself from urges to smoke. Talk to someone, go for a walk, or occupy your time with a task.  Change your normal routine. Take a different route to work. Drink tea instead of coffee. Eat breakfast in a different place.  Reduce your stress. Take a hot bath, exercise, or read a book.  Plan something enjoyable to do every day. Reward yourself for not smoking.  Explore interactive web-based programs that specialize in helping you quit. GET MEDICINE AND USE IT CORRECTLY Medicines can help you stop smoking and decrease the urge to smoke. Combining medicine with the above behavioral methods and support can greatly increase your chances of successfully quitting smoking.  Nicotine replacement therapy helps deliver nicotine to your body without the negative effects and risks of smoking. Nicotine replacement therapy includes nicotine gum, lozenges, inhalers, nasal sprays, and skin patches. Some may be available over-the-counter and others require a prescription.  Antidepressant medicine helps people abstain from smoking, but how this works is unknown. This medicine is available by prescription.  Nicotinic receptor partial agonist medicine simulates the effect of nicotine in your brain. This medicine is available by prescription. Ask your health care provider for advice about which medicines to use and how to use them based on your health history. Your health care provider will tell you what side effects to look out for if you choose to be on a medicine or therapy. Carefully read the information on the package. Do not use any other product containing nicotine while using a nicotine replacement product.    RELAPSE OR DIFFICULT SITUATIONS Most relapses occur within the first 3 months after quitting. Do not be discouraged if you start smoking again. Remember, most people try several times before finally quitting. You may have symptoms of withdrawal because your body is used to nicotine. You may crave cigarettes, be irritable, feel very hungry, cough often, get headaches, or have difficulty concentrating. The withdrawal symptoms are only temporary. They are strongest when you first quit, but they will go away within 10-14 days. To reduce the chances of relapse, try to:  Avoid drinking alcohol. Drinking lowers your chances of successfully quitting.  Reduce the amount of caffeine you consume. Once you quit smoking, the amount of caffeine in your body increases and can give you symptoms, such as a rapid heartbeat, sweating, and anxiety.  Avoid smokers because they can make you want to smoke.  Do not let weight gain distract you. Many   smokers will gain weight when they quit, usually less than 10 pounds. Eat a healthy diet and stay active. You can always lose the weight gained after you quit.  Find ways to improve your mood other than smoking. FOR MORE INFORMATION  www.smokefree.gov  Document Released: 01/16/2001 Document Revised: 06/08/2013 Document Reviewed: 05/03/2011 ExitCare Patient Information 2015 [employer]. This information is not intended to replace advice given to you by your health care provider. Make sure you discuss any questions you have with your health care provider.   Smoking Cessation, Tips for Success If you are ready to quit smoking, congratulations! You have chosen to help yourself be healthier. Cigarettes bring nicotine, tar, carbon monoxide, and other irritants into your body. Your lungs, heart, and blood vessels will be able to work better without these poisons. There are many different ways to quit smoking. Nicotine gum, nicotine patches, a nicotine inhaler, or nicotine  nasal spray can help with physical craving. Hypnosis, support groups, and medicines help break the habit of smoking. WHAT THINGS CAN I DO TO MAKE QUITTING EASIER?  Here are some tips to help you quit for good:  Pick a date when you will quit smoking completely. Tell all of your friends and family about your plan to quit on that date.  Do not try to slowly cut down on the number of cigarettes you are smoking. Pick a quit date and quit smoking completely starting on that day.  Throw away all cigarettes.   Clean and remove all ashtrays from your home, work, and car.  On a card, write down your reasons for quitting. Carry the card with you and read it when you get the urge to smoke.  Cleanse your body of nicotine. Drink enough water and fluids to keep your urine clear or pale yellow. Do this after quitting to flush the nicotine from your body.  Learn to predict your moods. Do not let a bad situation be your excuse to have a cigarette. Some situations in your life might tempt you into wanting a cigarette.  Never have "just one" cigarette. It leads to wanting another and another. Remind yourself of your decision to quit.  Change habits associated with smoking. If you smoked while driving or when feeling stressed, try other activities to replace smoking. Stand up when drinking your coffee. Brush your teeth after eating. Sit in a different chair when you read the paper. Avoid alcohol while trying to quit, and try to drink fewer caffeinated beverages. Alcohol and caffeine may urge you to smoke.  Avoid foods and drinks that can trigger a desire to smoke, such as sugary or spicy foods and alcohol.  Ask people who smoke not to smoke around you.  Have something planned to do right after eating or having a cup of coffee. For example, plan to take a walk or exercise.  Try a relaxation exercise to calm you down and decrease your stress. Remember, you may be tense and nervous for the first 2 weeks after  you quit, but this will pass.  Find new activities to keep your hands busy. Play with a pen, coin, or rubber band. Doodle or draw things on paper.  Brush your teeth right after eating. This will help cut down on the craving for the taste of tobacco after meals. You can also try mouthwash.   Use oral substitutes in place of cigarettes. Try using lemon drops, carrots, cinnamon sticks, or chewing gum. Keep them handy so they are available when you   have the urge to smoke.  When you have the urge to smoke, try deep breathing.  Designate your home as a nonsmoking area.  If you are a heavy smoker, ask your health care provider about a prescription for nicotine chewing gum. It can ease your withdrawal from nicotine.  Reward yourself. Set aside the cigarette money you save and buy yourself something nice.  Look for support from others. Join a support group or smoking cessation program. Ask someone at home or at work to help you with your plan to quit smoking.  Always ask yourself, "Do I need this cigarette or is this just a reflex?" Tell yourself, "Today, I choose not to smoke," or "I do not want to smoke." You are reminding yourself of your decision to quit.  Do not replace cigarette smoking with electronic cigarettes (commonly called e-cigarettes). The safety of e-cigarettes is unknown, and some may contain harmful chemicals.  If you relapse, do not give up! Plan ahead and think about what you will do the next time you get the urge to smoke. HOW WILL I FEEL WHEN I QUIT SMOKING? You may have symptoms of withdrawal because your body is used to nicotine (the addictive substance in cigarettes). You may crave cigarettes, be irritable, feel very hungry, cough often, get headaches, or have difficulty concentrating. The withdrawal symptoms are only temporary. They are strongest when you first quit but will go away within 10-14 days. When withdrawal symptoms occur, stay in control. Think about your reasons  for quitting. Remind yourself that these are signs that your body is healing and getting used to being without cigarettes. Remember that withdrawal symptoms are easier to treat than the major diseases that smoking can cause.  Even after the withdrawal is over, expect periodic urges to smoke. However, these cravings are generally short lived and will go away whether you smoke or not. Do not smoke! WHAT RESOURCES ARE AVAILABLE TO HELP ME QUIT SMOKING? Your health care provider can direct you to community resources or hospitals for support, which may include:  Group support.  Education.  Hypnosis.  Therapy. Document Released: 10/21/2003 Document Revised: 06/08/2013 Document Reviewed: 07/10/2012 ExitCare Patient Information 2015 [employer]. This information is not intended to replace advice given to you by your health care provider. Make sure you discuss any questions you have with your health care provider.   

## 2014-10-07 NOTE — Progress Notes (Signed)
[facility]  Established Abdominal Aortic Aneurysm  History of Present Illness  Arthur Evans. is a 69 y.o. (1945/10/03) male patient of Dr. Oneida Alar who presents with chief complaint: follow up for AAA. Previous studies demonstrate an AAA measuring 3.5 cm. The patient does have chronic back pain from an old back injury, he denies any new back pain, he denies abdominal pain.  His small AAA was found incidentally during evaluation of his spine issues.  He has had a back surgery and has chronic back pain. The patient is a smoker. The patient denies claudication in legs with walking.  His walking is limited due to back issues, denies non healing wounds. The patient denies history of stroke or TIA symptoms. He takes a daily 81 mg ASA, daily statin, and a daily beta blocker. He denies any history of MI but does have a cardiac stent, Dr. Aundra Dubin is his cardiologist.  He has unintentional weight loss over the last 6 months, denies post prandial abdominal pain, denies food fear. He reports loose stools about an hour after he eats, and also early satiety.  He denies palpitations, denies feeling too hot.  Pt Diabetic: Yes, states in good control Pt smoker: smoker (2/3 ppd, decreased from over 1 ppd, started at age 50 yrs)    Past Medical History  Diagnosis Date  . AAA (abdominal aortic aneurysm)   . History of trauma 64    [name] fell on him causing severe back injury  . Diabetes mellitus   . Arthritis   . Anxiety   . Depression   . Myocardial infarction July 2010  . DVT (deep venous thrombosis)   . Seizure disorder July 2016   Past Surgical History  Procedure Laterality Date  . Back surgery  1983  . Vasectomy    . Cardiac catheterization      cardiac stents  . Spine surgery     Social History Social History   Social History  . Marital Status: Divorced    Spouse Name: N/A  . Number of Children: 4  . Years of Education: N/A    Occupational History  . Driver    Social History Main Topics  . Smoking status: Current Every Day Smoker -- 0.10 packs/day for 45 years    Types: E-cigarettes, Cigarettes  . Smokeless tobacco: Never Used     Comment: trying to quit; has decreased from 2 ppd to 1/2  ppd  . Alcohol Use: No  . Drug Use: No  . Sexual Activity: Not Currently   Other Topics Concern  . Not on file   Social History Narrative   Family History Family History  Problem Relation Age of Onset  . Colon cancer Neg Hx   . Rectal cancer Neg Hx   . Stomach cancer Neg Hx   . Esophageal cancer Neg Hx   . Diabetes Mother   . Hyperlipidemia Mother   . Hypertension Mother   . Heart attack Mother   . Heart disease Father     Before age 47  . Hyperlipidemia Father   . Hypertension Father   . Heart attack Father   . Diabetes Sister   . Hypertension Sister   . Heart attack Sister   . Heart disease Sister     After age 21  . Hyperlipidemia Sister   . Cancer Brother     Thyroid  . Hypertension Brother   . Heart disease Brother     after age  60  . Heart attack Brother     Current Outpatient Prescriptions on File Prior to Visit  Medication Sig Dispense Refill  . aspirin 81 MG tablet Take 1 tablet (81 mg total) by mouth daily.    Marland Kitchen atorvastatin (LIPITOR) 80 MG tablet Take 1 tablet (80 mg total) by mouth daily. 30 tablet 6  . carvedilol (COREG) 6.25 MG tablet TAKE ONE TABLET BY MOUTH TWICE DAILY WITH MEALS. 60 tablet 3  . glipiZIDE (GLUCOTROL) 5 MG tablet TAKE ONE TABLET BY MOUTH ONCE DAILY 90 tablet 2  . metFORMIN (GLUCOPHAGE) 1000 MG tablet Take 1 tablet (1,000 mg total) by mouth 2 (two) times daily with a meal. 180 tablet 1  . ramipril (ALTACE) 2.5 MG capsule TAKE ONE CAPSULE BY MOUTH TWICE DAILY 180 capsule 1  . ramipril (ALTACE) 2.5 MG capsule TAKE ONE CAPSULE BY MOUTH TWICE DAILY (Patient not taking: Reported on 10/07/2014) 60 capsule 11   No current facility-administered medications on file prior to  visit.   Allergies  Allergen Reactions  . Novocain [Procaine Hcl] Anxiety    nervousness    ROS: See HPI for pertinent positives and negatives.  Physical Examination  Filed Vitals:   10/07/14 0923  BP: 111/68  Pulse: 90  Temp: 97.9 F (36.6 C)  TempSrc: Oral  Resp: 16  Height: 5\' 9"  (1.753 m)  Weight: 126 lb (57.153 kg)  SpO2: 100%   Body mass index is 18.6 kg/(m^2).  General: A&O x 3, WD.  Pulmonary: Sym exp, fair air movt, CTAB, no rales, rhonchi, or wheezing, occasional moist cough  Cardiac: RRR, Nl S1, S2, no detected murmur.   Carotid Bruits Left Right   Negative Negative  Aorta is palpable Radial pulses are 1+ palpable and =   VASCULAR EXAM:     LE Pulses LEFT RIGHT   FEMORAL 1+ palpable 2+ palpable    POPLITEAL not palpable  not palpable   POSTERIOR TIBIAL not palpable  not palpable    DORSALIS PEDIS  ANTERIOR TIBIAL not palpable  not palpable   Toes of both feet are pink with brisk capillary refill.   Gastrointestinal: soft, NTND, -G/R, - HSM, - palpable masses, - CVAT B.  Musculoskeletal: M/S 5/5 throughout, Extremities without ischemic changes. Posture: leans to his right.  Neurologic: CN 2-12 intact, Pain and light touch intact in extremities are intact except, Motor exam as listed above.         Non-Invasive Vascular Imaging  AAA Duplex (10/07/2014) ABDOMINAL AORTA DUPLEX EVALUATION    INDICATION: Evaluation of abdominal aorta.    PREVIOUS INTERVENTION(S):     DUPLEX EXAM:     LOCATION DIAMETER AP (cm) DIAMETER TRANSVERSE (cm) VELOCITIES (cm/sec)  Aorta Proximal 3.18 3.25 90  Aorta Mid 3.88 3.81 53  Aorta Distal 2.56 2.64 42  Right Common Iliac Artery 0.69 0.69 111  Left Common Iliac Artery 1.31 1.28 28    Previous max  aortic diameter:  3.5 x 3.6 Date: 08/20/2013  ADDITIONAL FINDINGS:     IMPRESSION: Patent abdominal aortic aneurysm measuring approximately 3.88 x 3.81 cm in diameter.     Compared to the previous exam:  Slight increase in the maximum diameter compared to the last exam.     Medical Decision Making  The patient is a 69 y.o. male who presents with asymptomatic AAA with a 2.88 mm increase in size over a year with the largest diameter today measuring 3.88 x 3.81 cm. Pt states he will see his PCP soon.  I advised him to speak with his PCP re his weight loss and loose stools after eating.  The patient was counseled re smoking cessation and given several free resources re smoking cessation.   Based on this patient's exam and diagnostic studies, the patient will follow up in 1 year  with the following studies: AAA Duplex.  Consideration for repair of AAA would be made when the size is 5.5 cm, growth > 1 cm/yr, and symptomatic status.  I emphasized the importance of maximal medical management including strict control of blood pressure, blood glucose, and lipid levels, antiplatelet agents, obtaining regular exercise, and cessation of smoking.   The patient was given information about AAA including signs, symptoms, treatment, and how to minimize the risk of enlargement and rupture of aneurysms.    The patient was advised to call 911 should the patient experience sudden onset abdominal or back pain.   Thank you for allowing [place] to participate in this patient's care.  Clemon Chambers, RN, MSN, FNP-C [facility] Office: [facility] Physician: Oneida Alar  10/07/2014, 9:46 AM

## 2014-10-13 ENCOUNTER — Other Ambulatory Visit: Payer: Commercial Managed Care - HMO

## 2014-10-14 ENCOUNTER — Other Ambulatory Visit (INDEPENDENT_AMBULATORY_CARE_PROVIDER_SITE_OTHER): Payer: Commercial Managed Care - HMO

## 2014-10-14 DIAGNOSIS — I6523 Occlusion and stenosis of bilateral carotid arteries: Secondary | ICD-10-CM | POA: Diagnosis not present

## 2014-10-14 LAB — HEPATIC FUNCTION PANEL
ALT: 10 U/L (ref 0–53)
AST: 10 U/L (ref 0–37)
Albumin: 3.7 g/dL (ref 3.5–5.2)
Alkaline Phosphatase: 57 U/L (ref 39–117)
BILIRUBIN TOTAL: 0.4 mg/dL (ref 0.2–1.2)
Bilirubin, Direct: 0.1 mg/dL (ref 0.0–0.3)
Total Protein: 7 g/dL (ref 6.0–8.3)

## 2014-10-14 LAB — LIPID PANEL
CHOLESTEROL: 155 mg/dL (ref 0–200)
HDL: 44.9 mg/dL (ref >39.00)
LDL CALC: 96 mg/dL (ref 0–99)
NonHDL: 109.79
TRIGLYCERIDES: 71 mg/dL (ref 0.0–149.0)
Total CHOL/HDL Ratio: 3
VLDL: 14.2 mg/dL (ref 0.0–40.0)

## 2014-10-18 ENCOUNTER — Other Ambulatory Visit: Payer: Self-pay | Admitting: Internal Medicine

## 2014-10-18 ENCOUNTER — Telehealth: Payer: Self-pay | Admitting: Cardiology

## 2014-10-18 DIAGNOSIS — I2510 Atherosclerotic heart disease of native coronary artery without angina pectoris: Secondary | ICD-10-CM

## 2014-10-18 MED ORDER — ROSUVASTATIN CALCIUM 40 MG PO TABS: 40.0000 mg | ORAL_TABLET | Freq: Every day | ORAL | Status: DC

## 2014-10-18 NOTE — Telephone Encounter (Signed)
Spoke with patient about recent lab results 

## 2014-10-18 NOTE — Telephone Encounter (Signed)
New message ° ° ° ° °Pt returning call regarding lab results °

## 2014-11-16 ENCOUNTER — Telehealth: Payer: Self-pay

## 2014-11-16 NOTE — Telephone Encounter (Signed)
Patient called to educate on Medicare Wellness apt.Agreed to schedule 10/18/ Tues at 8am

## 2014-11-23 ENCOUNTER — Ambulatory Visit (INDEPENDENT_AMBULATORY_CARE_PROVIDER_SITE_OTHER): Payer: Commercial Managed Care - HMO

## 2014-11-23 VITALS — BP 116/66 | Temp 97.7°F | Ht 66.0 in | Wt 133.0 lb

## 2014-11-23 DIAGNOSIS — Z0000 Encounter for general adult medical examination without abnormal findings: Secondary | ICD-10-CM | POA: Diagnosis not present

## 2014-11-23 DIAGNOSIS — Z23 Encounter for immunization: Secondary | ICD-10-CM

## 2014-11-23 NOTE — Progress Notes (Addendum)
Subjective:   Arthur Evans. is a 69 y.o. male who presents for Medicare Annual/Subsequent preventive examination.  Review of Systems:   HRA assessment completed during visit; [name], [name] The Patient was informed that this wellness visit is to identify risk and educate on how to reduce risk for increase disease through lifestyle changes.   ROS deferred to CPE exam with physician  Hx broke back 1983 the second time; now has curvature  Had seizure when he went 22 hours without eating; has learned to eat and not skip meals; Carries sugar with him   Tobacco use;  quit x 8 years x 15 to 20 years ago; tried electronic cigarettes and did not like them; smokes about 8 cigarettes a day at present; doesn't seem to have "a good enough reason to quit and enjoys them at present;" Spent a few minutes coaching but motivation to quit is low at this time;   Psychosocial  Lost one brother and 2 sisters;  The patient is the youngest of the family and the only one living. Has 19 grand-children; has friend living with him with 2 children;   Monitors for AAA  Being monitored by Vascular; Was enc to stop smoking CAD and stent HTN - BP very good today A1c -  7.2 in June; down from 8.0 in January. States he cut his Metformin back to   Bid and the diarrhea he had x 1 month stopped. He was losing weight and has stopped losing since he cut the dose; Asked about his BS but has not checked it; States "it is fine." Recommended the patient check his BS at least 3 times a week in the am, at lunch and perhaps at hs.  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[employer];  Eats 3  times a day; stop on road but does eat 3 meals a day  Exercise; push-ups; arm weights; every day x 1 month Can't walk long because of back but can split wood;   SAFETY (lives with friend and 2 kids- one is 29 and the other is 8)  One level home Safety reviewed for the home; including removal of clutter; clear paths through the home,  railing as needed; bathroom safety; community safety; smoke detectors and firearms safety as well as sun protection; no, but wears a hat Driving accidents ( no ) and seatbelt - yes   Medication review: metformin cut in half because of diarrhea x 1 month ago and weight loss has stopped  Fall assessment no falls Gait assessment; get up and go normal <10 sec   Mobilization and Functional losses in the last year. No Sleep patterns; no issues   Urinary or fecal incontinence reviewed; no diarrhea since stopping metformin  Given copy of Advanced Directive and to call for assistance  Counseling: Hep C; given brochure on Hep C Colonoscopy; 01/2012/ repeat in 10 years 2023; normal EKG 06/07/2014/ 2 d echo 06/2013 EF 55 to 60% Hearing: 4000 hz in both ears;  Ophthalmology exam;  diabetic eye exam neg for retinopathy on 02/2014 Diabetic foot exam 11/2011 Immunizations Due  PCV  73- in record as deferred Jan 2016 taken today and given flu shot today         Objective:    Vitals: BP 116/66 mmHg  Temp(Src) 97.7 F (36.5 C)  Ht 5\' 6"  (1.676 m)  Wt 133 lb (60.328 kg)  BMI 21.48 kg/m2  Tobacco History  Smoking status  . Current Every Day Smoker -- 0.50 packs/day for 50 years  . Types: Cigarettes, E-cigarettes  Smokeless tobacco  . Current User  . Types: Chew    Comment: trying to quit; has decreased from 2 ppd to 1/2  ppd/ 8 packs      Ready to quit: Not Answered Counseling given: Not Answered   Past Medical History  Diagnosis Date  . AAA (abdominal aortic aneurysm) ([place])   . History of trauma 64    [name] fell on him causing severe back  injury  . Diabetes mellitus   . Arthritis   . Anxiety   . Depression   . Myocardial infarction [facility]) July 2010  . DVT (deep venous thrombosis) ([place])   . Seizure disorder [facility]) July 2016   Past Surgical History  Procedure Laterality Date  . Back surgery  1983  . Vasectomy    . Cardiac catheterization      cardiac stents  . Spine surgery     Family History  Problem Relation Age of Onset  . Colon cancer Neg Hx   . Rectal cancer Neg Hx   . Stomach cancer Neg Hx   . Esophageal cancer Neg Hx   . Diabetes Mother   . Hyperlipidemia Mother   . Hypertension Mother   . Heart attack Mother   . Heart disease Father     Before age 5  . Hyperlipidemia Father   . Hypertension Father   . Heart attack Father   . Diabetes Sister   . Hypertension Sister   . Heart attack Sister   . Heart disease Sister     After age 74  . Hyperlipidemia Sister   . Cancer Brother     Thyroid  . Hypertension Brother   . Heart disease Brother     after age 14  . Heart attack Brother    History  Sexual Activity  . Sexual Activity: Not Currently    Outpatient Encounter Prescriptions as of 11/23/2014  Medication Sig  . aspirin 81 MG tablet Take 1 tablet (81 mg total) by mouth daily.  . carvedilol (COREG) 6.25 MG tablet TAKE ONE TABLET BY MOUTH TWICE DAILY WITH MEALS.  Marland Kitchen glipiZIDE (GLUCOTROL) 5 MG tablet TAKE ONE TABLET BY MOUTH ONCE DAILY  . metFORMIN (GLUCOPHAGE) 1000 MG tablet TAKE ONE TABLET BY MOUTH TWICE DAILY WITH A MEAL  . ramipril (ALTACE) 2.5 MG capsule TAKE ONE CAPSULE BY MOUTH TWICE DAILY  . ramipril (ALTACE) 2.5 MG capsule TAKE ONE CAPSULE BY MOUTH TWICE DAILY (Patient not taking: Reported on 10/07/2014)  . rosuvastatin (CRESTOR) 40 MG tablet Take 1 tablet (40 mg total) by mouth daily.   No facility-administered encounter medications on file as of 11/23/2014.    Activities of Daily Living In your present state of health, do you have any difficulty performing the following activities:  11/23/2014  Hearing? N  Vision? N  Difficulty concentrating or making decisions? N  Walking or climbing stairs? N  Dressing or bathing? N  Doing errands, shopping? N  Preparing Food and eating ? N  Using the Toilet? N  In the past six months, have you  accidently leaked urine? N  Do you have problems with loss of bowel control? N  Managing your Medications? N  Managing your Finances? N  Housekeeping or managing your Housekeeping? N    Patient Care Team: Janith Lima, MD as PCP - General Larey Dresser, MD as Consulting Physician (Cardiology) Garald Balding, MD as Consulting Physician (Orthopedic Surgery)   Assessment:    Assessment   Patient presents for yearly preventative medicine examination. Medicare questionnaire screening were completed, i.e. Functional; fall risk; depression, memory loss and hearing all wnl.   All immunizations and health maintenance protocols were reviewed with the patient and updated.  Education provided for laboratory screens;    Medication reconciliation, past medical history, social history, problem list and allergies were reviewed in detail with the patient  Goals were established with regard to weight gain;  exercise, and diet in compliance with medications based on the patient individualized risk;   End of life planning was discussed.   Exercise Activities and Dietary recommendations    Goals    . Gain weight     Wants to gain 10lbs; will continue to eat 3 times a day / takes ensure  Will continue to do your weights;       Fall Risk Fall Risk  11/23/2014 08/03/2014 03/12/2014 06/03/2013 06/03/2013  Falls in the past year? No Yes No No No  Number falls in past yr: - 1 - - -  Injury with Fall? - Yes - - -   Depression Screen PHQ 2/9 Scores 11/23/2014 08/03/2014 03/12/2014 06/03/2013  PHQ - 2 Score 0 0 0 0    Cognitive Testing MMSE - Mini Mental State Exam 11/23/2014  Not completed: (No Data)   Ad8 Score 0  Immunization  History  Administered Date(s) Administered  . Influenza Split 11/26/2011  . Influenza,inj,Quad PF,36+ Mos 10/05/2013  . Pneumococcal Polysaccharide-23 02/05/2009  . Td 09/29/2009  . Zoster 10/06/2013   Screening Tests Health Maintenance  Topic Date Due  . Hepatitis C Screening  [birth year]  . PNA vac Low Risk Adult (1 of 2 - PCV13) 02/05/2010  . INFLUENZA VACCINE  09/06/2014  . HEMOGLOBIN A1C  02/02/2015  . OPHTHALMOLOGY EXAM  03/04/2015  . FOOT EXAM  08/03/2015  . URINE MICROALBUMIN  08/03/2015  . TETANUS/TDAP  09/30/2019  . COLONOSCOPY  01/15/2022  . ZOSTAVAX  Completed      Plan:     Received the Prevnar and the flu shot today  Discussed smoking cessation.    Goal is to gain x 10 lbs; Agreed to start checking A1c at least 3 times a week. Does take glucometer with him when he travels;     During the course of the visit the patient was educated and counseled about the following appropriate screening and preventive services:   Vaccines to include Pneumoccal, Influenza, Hepatitis B, Td,  Zostavax, HCV- Prevnar was deferred in Jan and was given today with flu vaccine  Electrocardiogram/ deferred   Cardiovascular Disease/ denies pain or issues;   Colorectal cancer screening   Diabetes screening/ agreed to check at least 3 times per week;   Cut metformin in 1/2 and diarrhea stopped  Apt for 10/27 at 8:15 to fup with Dr. Ronnald Ramp;   Prostate Cancer Screening/ deferred to medical  Glaucoma screening- neg  Nutrition counseling / went but eats on the road; mixed choices   Smoking cessation counseling/ reviewed and given information; Motivation to quit low at present  Patient Instructions (the written  plan) was given to the patient.     Medical screening examination/treatment/procedure(s) were performed by non-physician practitioner and as supervising physician I was immediately available for consultation/collaboration. I agree with above. Scarlette Calico,  MD   POEUM,PNTIR, RN  11/23/2014

## 2014-11-23 NOTE — Patient Instructions (Addendum)
Arthur Evans , Thank you for taking time to come for your Medicare Wellness Visit. I appreciate your ongoing commitment to your health goals. Please review the following plan we discussed and let me know if I can assist you in the future.   Review information on rotator cuff / may need apt for dx and treatment if the shoulder continues to bother you.  Took Prevnar today; Deferred in Jan 2016 Also took flu shot today;     These are the goals we discussed: Goals    . Gain weight     Wants to gain 10lbs; will continue to eat 3 times a day / takes ensure  Will continue to do your weights;        This is a list of the screening recommended for you and due dates:  Health Maintenance  Topic Date Due  .  Hepatitis C: One time screening is recommended by Center for Disease Control  (CDC) for  adults born from [birth year] through 1966.   07/17/1945  . Pneumonia vaccines (1 of 2 - PCV13) 02/05/2010  . Flu Shot  09/06/2014  . Hemoglobin A1C  02/02/2015  . Eye exam for diabetics  03/04/2015  . Complete foot exam   08/03/2015  . Urine Protein Check  08/03/2015  . Tetanus Vaccine  09/30/2019  . Colon Cancer Screening  01/15/2022  . Shingles Vaccine  Completed   Impingement Syndrome, Rotator Cuff, Bursitis With Rehab Impingement syndrome is a condition that involves inflammation of the tendons of the rotator cuff and the subacromial bursa, that causes pain in the shoulder. The rotator cuff consists of four tendons and muscles that control much of the shoulder and upper arm function. The subacromial bursa is a fluid filled sac that helps reduce friction between the rotator cuff and one of the bones of the shoulder (acromion). Impingement syndrome is usually an overuse injury that causes swelling of the bursa (bursitis), swelling of the tendon (tendonitis), and/or a tear of the tendon (strain). Strains are classified into three categories. Grade 1 strains cause pain, but the tendon is not lengthened. Grade 2  strains include a lengthened ligament, due to the ligament being stretched or partially ruptured. With grade 2 strains there is still function, although the function may be decreased. Grade 3 strains include a complete tear of the tendon or muscle, and function is usually impaired. SYMPTOMS   Pain around the shoulder, often at the outer portion of the upper arm.  Pain that gets worse with shoulder function, especially when reaching overhead or lifting.  Sometimes, aching when not using the arm.  Pain that wakes you up at night.  Sometimes, tenderness, swelling, warmth, or redness over the affected area.  Loss of strength.  Limited motion of the shoulder, especially reaching behind the back (to the back pocket or to unhook bra) or across your body.  Crackling sound (crepitation) when moving the arm.  Biceps tendon pain and inflammation (in the front of the shoulder). Worse when bending the elbow or lifting. CAUSES  Impingement syndrome is often an overuse injury, in which chronic (repetitive) motions cause the tendons or bursa to become inflamed. A strain occurs when a force is paced on the tendon or muscle that is greater than it can withstand. Common mechanisms of injury include: Stress from sudden increase in duration, frequency, or intensity of training.  Direct hit (trauma) to the shoulder.  Aging, erosion of the tendon with normal use.  Bony bump on shoulder (acromial  spur). RISK INCREASES WITH:  Contact sports (football, wrestling, boxing).  Throwing sports (baseball, tennis, volleyball).  Weightlifting and bodybuilding.  Heavy labor.  Previous injury to the rotator cuff, including impingement.  Poor shoulder strength and flexibility.  Failure to warm up properly before activity.  Inadequate protective equipment.  Old age.  Bony bump on shoulder (acromial spur). PREVENTION   Warm up and stretch properly before activity.  Allow for adequate recovery between  workouts.  Maintain physical fitness:  Strength, flexibility, and endurance.  Cardiovascular fitness.  Learn and use proper exercise technique. PROGNOSIS  If treated properly, impingement syndrome usually goes away within 6 weeks. Sometimes surgery is required.  RELATED COMPLICATIONS   Longer healing time if not properly treated, or if not given enough time to heal.  Recurring symptoms, that result in a chronic condition.  Shoulder stiffness, frozen shoulder, or loss of motion.  Rotator cuff tendon tear.  Recurring symptoms, especially if activity is resumed too soon, with overuse, with a direct blow, or when using poor technique. TREATMENT  Treatment first involves the use of ice and medicine, to reduce pain and inflammation. The use of strengthening and stretching exercises may help reduce pain with activity. These exercises may be performed at home or with a therapist. If non-surgical treatment is unsuccessful after more than 6 months, surgery may be advised. After surgery and rehabilitation, activity is usually possible in 3 months.  MEDICATION  If pain medicine is needed, nonsteroidal anti-inflammatory medicines (aspirin and ibuprofen), or other minor pain relievers (acetaminophen), are often advised.  Do not take pain medicine for 7 days before surgery.  Prescription pain relievers may be given, if your caregiver thinks they are needed. Use only as directed and only as much as you need.  Corticosteroid injections may be given by your caregiver. These injections should be reserved for the most serious cases, because they may only be given a certain number of times. HEAT AND COLD  Cold treatment (icing) should be applied for 10 to 15 minutes every 2 to 3 hours for inflammation and pain, and immediately after activity that aggravates your symptoms. Use ice packs or an ice massage.  Heat treatment may be used before performing stretching and strengthening activities prescribed  by your caregiver, physical therapist, or athletic trainer. Use a heat pack or a warm water soak. SEEK MEDICAL CARE IF:   Symptoms get worse or do not improve in 4 to 6 weeks, despite treatment.  New, unexplained symptoms develop. (Drugs used in treatment may produce side effects.) EXERCISES  RANGE OF MOTION (ROM) AND STRETCHING EXERCISES - Impingement Syndrome (Rotator Cuff  Tendinitis, Bursitis) These exercises may help you when beginning to rehabilitate your injury. Your symptoms may go away with or without further involvement from your physician, physical therapist or athletic trainer. While completing these exercises, remember:   Restoring tissue flexibility helps normal motion to return to the joints. This allows healthier, less painful movement and activity.  An effective stretch should be held for at least 30 seconds.  A stretch should never be painful. You should only feel a gentle lengthening or release in the stretched tissue. STRETCH - Flexion, Standing  Stand with good posture. With an underhand grip on your right / left hand, and an overhand grip on the opposite hand, grasp a broomstick or cane so that your hands are a little more than shoulder width apart.  Keeping your right / left elbow straight and shoulder muscles relaxed, push the stick with your  opposite hand, to raise your right / left arm in front of your body and then overhead. Raise your arm until you feel a stretch in your right / left shoulder, but before you have increased shoulder pain.  Try to avoid shrugging your right / left shoulder as your arm rises, by keeping your shoulder blade tucked down and toward your mid-back spine. Hold for __________ seconds.  Slowly return to the starting position. Repeat __________ times. Complete this exercise __________ times per day. STRETCH - Abduction, Supine  Lie on your back. With an underhand grip on your right / left hand and an overhand grip on the opposite hand,  grasp a broomstick or cane so that your hands are a little more than shoulder width apart.  Keeping your right / left elbow straight and your shoulder muscles relaxed, push the stick with your opposite hand, to raise your right / left arm out to the side of your body and then overhead. Raise your arm until you feel a stretch in your right / left shoulder, but before you have increased shoulder pain.  Try to avoid shrugging your right / left shoulder as your arm rises, by keeping your shoulder blade tucked down and toward your mid-back spine. Hold for __________ seconds.  Slowly return to the starting position. Repeat __________ times. Complete this exercise __________ times per day. ROM - Flexion, Active-Assisted  Lie on your back. You may bend your knees for comfort.  Grasp a broomstick or cane so your hands are about shoulder width apart. Your right / left hand should grip the end of the stick, so that your hand is positioned "thumbs-up," as if you were about to shake hands.  Using your healthy arm to lead, raise your right / left arm overhead, until you feel a gentle stretch in your shoulder. Hold for __________ seconds.  Use the stick to assist in returning your right / left arm to its starting position. Repeat __________ times. Complete this exercise __________ times per day.  ROM - Internal Rotation, Supine   Lie on your back on a firm surface. Place your right / left elbow about 60 degrees away from your side. Elevate your elbow with a folded towel, so that the elbow and shoulder are the same height.  Using a broomstick or cane and your strong arm, pull your right / left hand toward your body until you feel a gentle stretch, but no increase in your shoulder pain. Keep your shoulder and elbow in place throughout the exercise.  Hold for __________ seconds. Slowly return to the starting position. Repeat __________ times. Complete this exercise __________ times per day. STRETCH -  Internal Rotation  Place your right / left hand behind your back, palm up.  Throw a towel or belt over your opposite shoulder. Grasp the towel with your right / left hand.  While keeping an upright posture, gently pull up on the towel, until you feel a stretch in the front of your right / left shoulder.  Avoid shrugging your right / left shoulder as your arm rises, by keeping your shoulder blade tucked down and toward your mid-back spine.  Hold for __________ seconds. Release the stretch, by lowering your healthy hand. Repeat __________ times. Complete this exercise __________ times per day. ROM - Internal Rotation   Using an underhand grip, grasp a stick behind your back with both hands.  While standing upright with good posture, slide the stick up your back until you feel a  mild stretch in the front of your shoulder.  Hold for __________ seconds. Slowly return to your starting position. Repeat __________ times. Complete this exercise __________ times per day.  STRETCH - Posterior Shoulder Capsule   Stand or sit with good posture. Grasp your right / left elbow and draw it across your chest, keeping it at the same height as your shoulder.  Pull your elbow, so your upper arm comes in closer to your chest. Pull until you feel a gentle stretch in the back of your shoulder.  Hold for __________ seconds. Repeat __________ times. Complete this exercise __________ times per day. STRENGTHENING EXERCISES - Impingement Syndrome (Rotator Cuff Tendinitis, Bursitis) These exercises may help you when beginning to rehabilitate your injury. They may resolve your symptoms with or without further involvement from your physician, physical therapist or athletic trainer. While completing these exercises, remember:  Muscles can gain both the endurance and the strength needed for everyday activities through controlled exercises.  Complete these exercises as instructed by your physician, physical therapist  or athletic trainer. Increase the resistance and repetitions only as guided.  You may experience muscle soreness or fatigue, but the pain or discomfort you are trying to eliminate should never worsen during these exercises. If this pain does get worse, stop and make sure you are following the directions exactly. If the pain is still present after adjustments, discontinue the exercise until you can discuss the trouble with your clinician.  During your recovery, avoid activity or exercises which involve actions that place your injured hand or elbow above your head or behind your back or head. These positions stress the tissues which you are trying to heal. STRENGTH - Scapular Depression and Adduction   With good posture, sit on a firm chair. Support your arms in front of you, with pillows, arm rests, or on a table top. Have your elbows in line with the sides of your body.  Gently draw your shoulder blades down and toward your mid-back spine. Gradually increase the tension, without tensing the muscles along the top of your shoulders and the back of your neck.  Hold for __________ seconds. Slowly release the tension and relax your muscles completely before starting the next repetition.  After you have practiced this exercise, remove the arm support and complete the exercise in standing as well as sitting position. Repeat __________ times. Complete this exercise __________ times per day.  STRENGTH - Shoulder Abductors, Isometric  With good posture, stand or sit about 4-6 inches from a wall, with your right / left side facing the wall.  Bend your right / left elbow. Gently press your right / left elbow into the wall. Increase the pressure gradually, until you are pressing as hard as you can, without shrugging your shoulder or increasing any shoulder discomfort.  Hold for __________ seconds.  Release the tension slowly. Relax your shoulder muscles completely before you begin the next  repetition. Repeat __________ times. Complete this exercise __________ times per day.  STRENGTH - External Rotators, Isometric  Keep your right / left elbow at your side and bend it 90 degrees.  Step into a door frame so that the outside of your right / left wrist can press against the door frame without your upper arm leaving your side.  Gently press your right / left wrist into the door frame, as if you were trying to swing the back of your hand away from your stomach. Gradually increase the tension, until you are pressing as hard  as you can, without shrugging your shoulder or increasing any shoulder discomfort.  Hold for __________ seconds.  Release the tension slowly. Relax your shoulder muscles completely before you begin the next repetition. Repeat __________ times. Complete this exercise __________ times per day.  STRENGTH - Supraspinatus   Stand or sit with good posture. Grasp a __________ weight, or an exercise band or tubing, so that your hand is "thumbs-up," like you are shaking hands.  Slowly lift your right / left arm in a "V" away from your thigh, diagonally into the space between your side and straight ahead. Lift your hand to shoulder height or as far as you can, without increasing any shoulder pain. At first, many people do not lift their hands above shoulder height.  Avoid shrugging your right / left shoulder as your arm rises, by keeping your shoulder blade tucked down and toward your mid-back spine.  Hold for __________ seconds. Control the descent of your hand, as you slowly return to your starting position. Repeat __________ times. Complete this exercise __________ times per day.  STRENGTH - External Rotators  Secure a rubber exercise band or tubing to a fixed object (table, pole) so that it is at the same height as your right / left elbow when you are standing or sitting on a firm surface.  Stand or sit so that the secured exercise band is at your uninjured  side.  Bend your right / left elbow 90 degrees. Place a folded towel or small pillow under your right / left arm, so that your elbow is a few inches away from your side.  Keeping the tension on the exercise band, pull it away from your body, as if pivoting on your elbow. Be sure to keep your body steady, so that the movement is coming only from your rotating shoulder.  Hold for __________ seconds. Release the tension in a controlled manner, as you return to the starting position. Repeat __________ times. Complete this exercise __________ times per day.  STRENGTH - Internal Rotators   Secure a rubber exercise band or tubing to a fixed object (table, pole) so that it is at the same height as your right / left elbow when you are standing or sitting on a firm surface.  Stand or sit so that the secured exercise band is at your right / left side.  Bend your elbow 90 degrees. Place a folded towel or small pillow under your right / left arm so that your elbow is a few inches away from your side.  Keeping the tension on the exercise band, pull it across your body, toward your stomach. Be sure to keep your body steady, so that the movement is coming only from your rotating shoulder.  Hold for __________ seconds. Release the tension in a controlled manner, as you return to the starting position. Repeat __________ times. Complete this exercise __________ times per day.  STRENGTH - Scapular Protractors, Standing   Stand arms length away from a wall. Place your hands on the wall, keeping your elbows straight.  Begin by dropping your shoulder blades down and toward your mid-back spine.  To strengthen your protractors, keep your shoulder blades down, but slide them forward on your rib cage. It will feel as if you are lifting the back of your rib cage away from the wall. This is a subtle motion and can be challenging to complete. Ask your caregiver for further instruction, if you are not sure you are doing  the exercise  correctly.  Hold for __________ seconds. Slowly return to the starting position, resting the muscles completely before starting the next repetition. Repeat __________ times. Complete this exercise __________ times per day. STRENGTH - Scapular Protractors, Supine  Lie on your back on a firm surface. Extend your right / left arm straight into the air while holding a __________ weight in your hand.  Keeping your head and back in place, lift your shoulder off the floor.  Hold for __________ seconds. Slowly return to the starting position, and allow your muscles to relax completely before starting the next repetition. Repeat __________ times. Complete this exercise __________ times per day. STRENGTH - Scapular Protractors, Quadruped  Get onto your hands and knees, with your shoulders directly over your hands (or as close as you can be, comfortably).  Keeping your elbows locked, lift the back of your rib cage up into your shoulder blades, so your mid-back rounds out. Keep your neck muscles relaxed.  Hold this position for __________ seconds. Slowly return to the starting position and allow your muscles to relax completely before starting the next repetition. Repeat __________ times. Complete this exercise __________ times per day.  STRENGTH - Scapular Retractors  Secure a rubber exercise band or tubing to a fixed object (table, pole), so that it is at the height of your shoulders when you are either standing, or sitting on a firm armless chair.  With a palm down grip, grasp an end of the band in each hand. Straighten your elbows and lift your hands straight in front of you, at shoulder height. Step back, away from the secured end of the band, until it becomes tense.  Squeezing your shoulder blades together, draw your elbows back toward your sides, as you bend them. Keep your upper arms lifted away from your body throughout the exercise.  Hold for __________ seconds. Slowly ease the  tension on the band, as you reverse the directions and return to the starting position. Repeat __________ times. Complete this exercise __________ times per day. STRENGTH - Shoulder Extensors   Secure a rubber exercise band or tubing to a fixed object (table, pole) so that it is at the height of your shoulders when you are either standing, or sitting on a firm armless chair.  With a thumbs-up grip, grasp an end of the band in each hand. Straighten your elbows and lift your hands straight in front of you, at shoulder height. Step back, away from the secured end of the band, until it becomes tense.  Squeezing your shoulder blades together, pull your hands down to the sides of your thighs. Do not allow your hands to go behind you.  Hold for __________ seconds. Slowly ease the tension on the band, as you reverse the directions and return to the starting position. Repeat __________ times. Complete this exercise __________ times per day.  STRENGTH - Scapular Retractors and External Rotators   Secure a rubber exercise band or tubing to a fixed object (table, pole) so that it is at the height as your shoulders, when you are either standing, or sitting on a firm armless chair.  With a palm down grip, grasp an end of the band in each hand. Bend your elbows 90 degrees and lift your elbows to shoulder height, at your sides. Step back, away from the secured end of the band, until it becomes tense.  Squeezing your shoulder blades together, rotate your shoulders so that your upper arms and elbows remain stationary, but your fists travel  upward to head height.  Hold for __________ seconds. Slowly ease the tension on the band, as you reverse the directions and return to the starting position. Repeat __________ times. Complete this exercise __________ times per day.  STRENGTH - Scapular Retractors and External Rotators, Rowing   Secure a rubber exercise band or tubing to a fixed object (table, pole) so that it  is at the height of your shoulders, when you are either standing, or sitting on a firm armless chair.  With a palm down grip, grasp an end of the band in each hand. Straighten your elbows and lift your hands straight in front of you, at shoulder height. Step back, away from the secured end of the band, until it becomes tense.  Step 1: Squeeze your shoulder blades together. Bending your elbows, draw your hands to your chest, as if you are rowing a boat. At the end of this motion, your hands and elbow should be at shoulder height and your elbows should be out to your sides.  Step 2: Rotate your shoulders, to raise your hands above your head. Your forearms should be vertical and your upper arms should be horizontal.  Hold for __________ seconds. Slowly ease the tension on the band, as you reverse the directions and return to the starting position. Repeat __________ times. Complete this exercise __________ times per day.  STRENGTH - Scapular Depressors  Find a sturdy chair without wheels, such as a dining room chair.  Keeping your feet on the floor, and your hands on the chair arms, lift your bottom up from the seat, and lock your elbows.  Keeping your elbows straight, allow gravity to pull your body weight down. Your shoulders will rise toward your ears.  Raise your body against gravity by drawing your shoulder blades down your back, shortening the distance between your shoulders and ears. Although your feet should always maintain contact with the floor, your feet should progressively support less body weight, as you get stronger.  Hold for __________ seconds. In a controlled and slow manner, lower your body weight to begin the next repetition. Repeat __________ times. Complete this exercise __________ times per day.    This information is not intended to replace advice given to you by your health care provider. Make sure you discuss any questions you have with your health care provider.    Document Released: 01/22/2005 Document Revised: 02/12/2014 Document Reviewed: 05/06/2008 Elsevier Interactive Patient Education 2016 [place] Maintenance, Male A healthy lifestyle and preventative care can promote health and wellness.  Maintain regular health, dental, and eye exams.  Eat a healthy diet. Foods like vegetables, fruits, whole grains, low-fat dairy products, and lean protein foods contain the nutrients you need and are low in calories. Decrease your intake of foods high in solid fats, added sugars, and salt. Get information about a proper diet from your health care provider, if necessary.  Regular physical exercise is one of the most important things you can do for your health. Most adults should get at least 150 minutes of moderate-intensity exercise (any activity that increases your heart rate and causes you to sweat) each week. In addition, most adults need muscle-strengthening exercises on 2 or more days a week.   Maintain a healthy weight. The body mass index (BMI) is a screening tool to identify possible weight problems. It provides an estimate of body fat based on height and weight. Your health care provider can find your BMI and can help you  achieve or maintain a healthy weight. For males 20 years and older:  A BMI below 18.5 is considered underweight.  A BMI of 18.5 to 24.9 is normal.  A BMI of 25 to 29.9 is considered overweight.  A BMI of 30 and above is considered obese.  Maintain normal blood lipids and cholesterol by exercising and minimizing your intake of saturated fat. Eat a balanced diet with plenty of fruits and vegetables. Blood tests for lipids and cholesterol should begin at age 83 and be repeated every 5 years. If your lipid or cholesterol levels are high, you are over age 96, or you are at high risk for heart disease, you may need your cholesterol levels checked more frequently.Ongoing high lipid and cholesterol levels should be treated  with medicines if diet and exercise are not working.  If you smoke, find out from your health care provider how to quit. If you do not use tobacco, do not start.  Lung cancer screening is recommended for adults aged 28-80 years who are at high risk for developing lung cancer because of a history of smoking. A yearly low-dose CT scan of the lungs is recommended for people who have at least a 30-pack-year history of smoking and are current smokers or have quit within the past 15 years. A pack year of smoking is smoking an average of 1 pack of cigarettes a day for 1 year (for example, a 30-pack-year history of smoking could mean smoking 1 pack a day for 30 years or 2 packs a day for 15 years). Yearly screening should continue until the smoker has stopped smoking for at least 15 years. Yearly screening should be stopped for people who develop a health problem that would prevent them from having lung cancer treatment.  If you choose to drink alcohol, do not have more than 2 drinks per day. One drink is considered to be 12 oz (360 mL) of beer, 5 oz (150 mL) of wine, or 1.5 oz (45 mL) of liquor.  Avoid the use of street drugs. Do not share needles with anyone. Ask for help if you need support or instructions about stopping the use of drugs.  High blood pressure causes heart disease and increases the risk of stroke. High blood pressure is more likely to develop in:  People who have blood pressure in the end of the normal range (100-139/85-89 mm Hg).  People who are overweight or obese.  People who are African American.  If you are 68-35 years of age, have your blood pressure checked every 3-5 years. If you are 65 years of age or older, have your blood pressure checked every year. You should have your blood pressure measured twice--once when you are at a hospital or clinic, and once when you are not at a hospital or clinic. Record the average of the two measurements. To check your blood pressure when you  are not at a hospital or clinic, you can use:  An automated blood pressure machine at a pharmacy.  A home blood pressure monitor.  If you are 72-56 years old, ask your health care provider if you should take aspirin to prevent heart disease.  Diabetes screening involves taking a blood sample to check your fasting blood sugar level. This should be done once every 3 years after age 72 if you are at a normal weight and without risk factors for diabetes. Testing should be considered at a younger age or be carried out more frequently if you are overweight  and have at least 1 risk factor for diabetes.  Colorectal cancer can be detected and often prevented. Most routine colorectal cancer screening begins at the age of 36 and continues through age 65. However, your health care provider may recommend screening at an earlier age if you have risk factors for colon cancer. On a yearly basis, your health care provider may provide home test kits to check for hidden blood in the stool. A small camera at the end of a tube may be used to directly examine the colon (sigmoidoscopy or colonoscopy) to detect the earliest forms of colorectal cancer. Talk to your health care provider about this at age 56 when routine screening begins. A direct exam of the colon should be repeated every 5-10 years through age 13, unless early forms of precancerous polyps or small growths are found.  People who are at an increased risk for hepatitis B should be screened for this virus. You are considered at high risk for hepatitis B if:  You were born in a country where hepatitis B occurs often. Talk with your health care provider about which countries are considered high risk.  Your parents were born in a high-risk country and you have not received a shot to protect against hepatitis B (hepatitis B vaccine).  You have HIV or AIDS.  You use needles to inject street drugs.  You live with, or have sex with, someone who has hepatitis  B.  You are a man who has sex with other men (MSM).  You get hemodialysis treatment.  You take certain medicines for conditions like cancer, organ transplantation, and autoimmune conditions.  Hepatitis C blood testing is recommended for all people born from [birth year] through 1966 and any individual with known risk factors for hepatitis C.  Healthy men should no longer receive prostate-specific antigen (PSA) blood tests as part of routine cancer screening. Talk to your health care provider about prostate cancer screening.  Testicular cancer screening is not recommended for adolescents or adult males who have no symptoms. Screening includes self-exam, a health care provider exam, and other screening tests. Consult with your health care provider about any symptoms you have or any concerns you have about testicular cancer.  Practice safe sex. Use condoms and avoid high-risk sexual practices to reduce the spread of sexually transmitted infections (STIs).  You should be screened for STIs, including gonorrhea and chlamydia if:  You are sexually active and are younger than 24 years.  You are older than 24 years, and your health care provider tells you that you are at risk for this type of infection.  Your sexual activity has changed since you were last screened, and you are at an increased risk for chlamydia or gonorrhea. Ask your health care provider if you are at risk.  If you are at risk of being infected with HIV, it is recommended that you take a prescription medicine daily to prevent HIV infection. This is called pre-exposure prophylaxis (PrEP). You are considered at risk if:  You are a man who has sex with other men (MSM).  You are a heterosexual man who is sexually active with multiple partners.  You take drugs by injection.  You are sexually active with a partner who has HIV.  Talk with your health care provider about whether you are at high risk of being infected with HIV. If you  choose to begin PrEP, you should first be tested for HIV. You should then be tested every 3 months for as  long as you are taking PrEP.  Use sunscreen. Apply sunscreen liberally and repeatedly throughout the day. You should seek shade when your shadow is shorter than you. Protect yourself by wearing long sleeves, pants, a wide-brimmed hat, and sunglasses year round whenever you are outdoors.  Tell your health care provider of new moles or changes in moles, especially if there is a change in shape or color. Also, tell your health care provider if a mole is larger than the size of a pencil eraser.  A one-time screening for abdominal aortic aneurysm (AAA) and surgical repair of large AAAs by ultrasound is recommended for men aged 7-75 years who are current or former smokers.  Stay current with your vaccines (immunizations).   This information is not intended to replace advice given to you by your health care provider. Make sure you discuss any questions you have with your health care provider.   Document Released: 07/21/2007 Document Revised: 02/12/2014 Document Reviewed: 06/19/2010 Elsevier Interactive Patient Education 2016 [employer].  Smoking Cessation, Tips for Success If you are ready to quit smoking, congratulations! You have chosen to help yourself be healthier. Cigarettes bring nicotine, tar, carbon monoxide, and other irritants into your body. Your lungs, heart, and blood vessels will be able to work better without these poisons. There are many different ways to quit smoking. Nicotine gum, nicotine patches, a nicotine inhaler, or nicotine nasal spray can help with physical craving. Hypnosis, support groups, and medicines help break the habit of smoking. WHAT THINGS CAN I DO TO MAKE QUITTING EASIER?  Here are some tips to help you quit for good:  Pick a date when you will quit smoking completely. Tell all of your friends and family about your plan to quit on that date.  Do not try to  slowly cut down on the number of cigarettes you are smoking. Pick a quit date and quit smoking completely starting on that day.  Throw away all cigarettes.   Clean and remove all ashtrays from your home, work, and car.  On a card, write down your reasons for quitting. Carry the card with you and read it when you get the urge to smoke.  Cleanse your body of nicotine. Drink enough water and fluids to keep your urine clear or pale yellow. Do this after quitting to flush the nicotine from your body.  Learn to predict your moods. Do not let a bad situation be your excuse to have a cigarette. Some situations in your life might tempt you into wanting a cigarette.  Never have "just one" cigarette. It leads to wanting another and another. Remind yourself of your decision to quit.  Change habits associated with smoking. If you smoked while driving or when feeling stressed, try other activities to replace smoking. Stand up when drinking your coffee. Brush your teeth after eating. Sit in a different chair when you read the paper. Avoid alcohol while trying to quit, and try to drink fewer caffeinated beverages. Alcohol and caffeine may urge you to smoke.  Avoid foods and drinks that can trigger a desire to smoke, such as sugary or spicy foods and alcohol.  Ask people who smoke not to smoke around you.  Have something planned to do right after eating or having a cup of coffee. For example, plan to take a walk or exercise.  Try a relaxation exercise to calm you down and decrease your stress. Remember, you may be tense and nervous for the first 2 weeks after you quit,  but this will pass.  Find new activities to keep your hands busy. Play with a pen, coin, or rubber band. Doodle or draw things on paper.  Brush your teeth right after eating. This will help cut down on the craving for the taste of tobacco after meals. You can also try mouthwash.   Use oral substitutes in place of cigarettes. Try using  lemon drops, carrots, cinnamon sticks, or chewing gum. Keep them handy so they are available when you have the urge to smoke.  When you have the urge to smoke, try deep breathing.  Designate your home as a nonsmoking area.  If you are a heavy smoker, ask your health care provider about a prescription for nicotine chewing gum. It can ease your withdrawal from nicotine.  Reward yourself. Set aside the cigarette money you save and buy yourself something nice.  Look for support from others. Join a support group or smoking cessation program. Ask someone at home or at work to help you with your plan to quit smoking.  Always ask yourself, "Do I need this cigarette or is this just a reflex?" Tell yourself, "Today, I choose not to smoke," or "I do not want to smoke." You are reminding yourself of your decision to quit.  Do not replace cigarette smoking with electronic cigarettes (commonly called e-cigarettes). The safety of e-cigarettes is unknown, and some may contain harmful chemicals.  If you relapse, do not give up! Plan ahead and think about what you will do the next time you get the urge to smoke. HOW WILL I FEEL WHEN I QUIT SMOKING? You may have symptoms of withdrawal because your body is used to nicotine (the addictive substance in cigarettes). You may crave cigarettes, be irritable, feel very hungry, cough often, get headaches, or have difficulty concentrating. The withdrawal symptoms are only temporary. They are strongest when you first quit but will go away within 10-14 days. When withdrawal symptoms occur, stay in control. Think about your reasons for quitting. Remind yourself that these are signs that your body is healing and getting used to being without cigarettes. Remember that withdrawal symptoms are easier to treat than the major diseases that smoking can cause.  Even after the withdrawal is over, expect periodic urges to smoke. However, these cravings are generally short lived and will  go away whether you smoke or not. Do not smoke! WHAT RESOURCES ARE AVAILABLE TO HELP ME QUIT SMOKING? Your health care provider can direct you to community resources or hospitals for support, which may include:  Group support.  Education.  Hypnosis.  Therapy.   This information is not intended to replace advice given to you by your health care provider. Make sure you discuss any questions you have with your health care provider.   Document Released: 10/21/2003 Document Revised: 02/12/2014 Document Reviewed: 07/10/2012 Elsevier Interactive Patient Education [employer].

## 2014-12-02 ENCOUNTER — Ambulatory Visit (INDEPENDENT_AMBULATORY_CARE_PROVIDER_SITE_OTHER): Payer: Commercial Managed Care - HMO | Admitting: Internal Medicine

## 2014-12-02 ENCOUNTER — Encounter: Payer: Self-pay | Admitting: Internal Medicine

## 2014-12-02 ENCOUNTER — Other Ambulatory Visit (INDEPENDENT_AMBULATORY_CARE_PROVIDER_SITE_OTHER): Payer: Commercial Managed Care - HMO

## 2014-12-02 VITALS — BP 100/70 | HR 83 | Temp 98.1°F | Resp 16 | Ht 66.0 in | Wt 136.0 lb

## 2014-12-02 DIAGNOSIS — I10 Essential (primary) hypertension: Secondary | ICD-10-CM | POA: Diagnosis not present

## 2014-12-02 DIAGNOSIS — E1165 Type 2 diabetes mellitus with hyperglycemia: Secondary | ICD-10-CM | POA: Diagnosis not present

## 2014-12-02 DIAGNOSIS — E118 Type 2 diabetes mellitus with unspecified complications: Secondary | ICD-10-CM

## 2014-12-02 DIAGNOSIS — IMO0002 Reserved for concepts with insufficient information to code with codable children: Secondary | ICD-10-CM

## 2014-12-02 DIAGNOSIS — I2510 Atherosclerotic heart disease of native coronary artery without angina pectoris: Secondary | ICD-10-CM | POA: Diagnosis not present

## 2014-12-02 LAB — BASIC METABOLIC PANEL
BUN: 16 mg/dL (ref 6–23)
CO2: 27 mEq/L (ref 19–32)
Calcium: 9.8 mg/dL (ref 8.4–10.5)
Chloride: 102 mEq/L (ref 96–112)
Creatinine, Ser: 1.19 mg/dL (ref 0.40–1.50)
GFR: 64.1 mL/min (ref >60.00)
Glucose, Bld: 156 mg/dL — ABNORMAL HIGH (ref 70–99)
POTASSIUM: 4.2 meq/L (ref 3.5–5.1)
SODIUM: 137 meq/L (ref 135–145)

## 2014-12-02 LAB — HEMOGLOBIN A1C: Hgb A1c MFr Bld: 7.4 % — ABNORMAL HIGH (ref 4.6–6.5)

## 2014-12-02 MED ORDER — METFORMIN HCL 1000 MG PO TABS: 500.0000 mg | ORAL_TABLET | Freq: Two times a day (BID) | ORAL | Status: AC

## 2014-12-02 NOTE — Progress Notes (Signed)
Pre visit review using our clinic review tool, if applicable. No additional management support is needed unless otherwise documented below in the visit note. 

## 2014-12-02 NOTE — Patient Instructions (Signed)

## 2014-12-02 NOTE — Progress Notes (Signed)
Subjective:  Patient ID: Arthur Evans., male    DOB: [birth year]  Age: 69 y.o. MRN: [id number]  CC: Hypertension and Diabetes   HPI Arthur Evans. presents for a BP and blood sugar check - he feels well and offer no complaints.  Outpatient Prescriptions Prior to Visit  Medication Sig Dispense Refill  . aspirin 81 MG tablet Take 1 tablet (81 mg total) by mouth daily.    . carvedilol (COREG) 6.25 MG tablet TAKE ONE TABLET BY MOUTH TWICE DAILY WITH MEALS. 60 tablet 3  . glipiZIDE (GLUCOTROL) 5 MG tablet TAKE ONE TABLET BY MOUTH ONCE DAILY 90 tablet 2  . ramipril (ALTACE) 2.5 MG capsule TAKE ONE CAPSULE BY MOUTH TWICE DAILY 60 capsule 11  . rosuvastatin (CRESTOR) 40 MG tablet Take 1 tablet (40 mg total) by mouth daily. 30 tablet 3  . metFORMIN (GLUCOPHAGE) 1000 MG tablet TAKE ONE TABLET BY MOUTH TWICE DAILY WITH A MEAL (Patient taking differently: take half tablet twice daily with meal) 180 tablet 0  . ramipril (ALTACE) 2.5 MG capsule TAKE ONE CAPSULE BY MOUTH TWICE DAILY 180 capsule 1   No facility-administered medications prior to visit.    ROS Review of Systems  Constitutional: Negative.  Negative for fever, chills, diaphoresis, appetite change and fatigue.  HENT: Negative.  Negative for dental problem, sore throat, trouble swallowing and voice change.   Eyes: Negative.   Respiratory: Negative.  Negative for cough, choking, chest tightness, shortness of breath and stridor.   Cardiovascular: Negative.  Negative for chest pain, palpitations and leg swelling.  Gastrointestinal: Negative.  Negative for nausea, vomiting, abdominal pain, diarrhea, constipation and blood in stool.  Endocrine: Negative.  Negative for polydipsia, polyphagia and polyuria.  Genitourinary: Negative.  Negative for urgency and difficulty urinating.  Musculoskeletal: Negative.  Negative for myalgias, back pain, joint swelling and arthralgias.  Skin: Negative.  Negative for rash.  Allergic/Immunologic:  Negative.   Neurological: Negative.  Negative for dizziness, tremors, syncope, light-headedness, numbness and headaches.  Hematological: Negative.  Negative for adenopathy. Does not bruise/bleed easily.  Psychiatric/Behavioral: Negative.     Objective:  BP 100/70 mmHg  Pulse 83  Temp(Src) 98.1 F (36.7 C) (Oral)  Resp 16  Ht 5\' 6"  (1.676 m)  Wt 136 lb (61.689 kg)  BMI 21.96 kg/m2  SpO2 98%  BP Readings from Last 3 Encounters:  12/02/14 100/70  11/23/14 116/66  10/07/14 111/68    Wt Readings from Last 3 Encounters:  12/02/14 136 lb (61.689 kg)  11/23/14 133 lb (60.328 kg)  10/07/14 126 lb (57.153 kg)    Physical Exam  Constitutional: He is oriented to person, place, and time. No distress.  HENT:  Mouth/Throat: Oropharynx is clear and moist. No oropharyngeal exudate.  Eyes: Conjunctivae are normal. Right eye exhibits no discharge. Left eye exhibits no discharge. No scleral icterus.  Neck: Normal range of motion. Neck supple. No JVD present. No tracheal deviation present. No thyromegaly present.  Cardiovascular: Normal rate, regular rhythm, normal heart sounds and intact distal pulses.  Exam reveals no gallop and no friction rub.   No murmur heard. Pulmonary/Chest: Effort normal and breath sounds normal. No stridor. No respiratory distress. He has no wheezes. He has no rales. He exhibits no tenderness.  Abdominal: Soft. Bowel sounds are normal. He exhibits no distension and no mass. There is no tenderness. There is no rebound and no guarding.  Musculoskeletal: Normal range of motion. He exhibits no edema or tenderness.  Lymphadenopathy:  He has no cervical adenopathy.  Neurological: He is oriented to person, place, and time.  Skin: Skin is warm and dry. No rash noted. He is not diaphoretic. No erythema. No pallor.  Vitals reviewed.   Lab Results  Component Value Date   WBC 9.0 06/07/2014   HGB 13.6 06/07/2014   HCT 40.3 06/07/2014   PLT 178 06/07/2014   GLUCOSE  156* 12/02/2014   CHOL 155 10/14/2014   TRIG 71.0 10/14/2014   HDL 44.90 10/14/2014   LDLDIRECT 181.8 11/08/2011   LDLCALC 96 10/14/2014   ALT 10 10/14/2014   AST 10 10/14/2014   NA 137 12/02/2014   K 4.2 12/02/2014   CL 102 12/02/2014   CREATININE 1.19 12/02/2014   BUN 16 12/02/2014   CO2 27 12/02/2014   TSH 1.21 02/08/2014   PSA 0.78 06/03/2013   INR 1.05 06/07/2014   HGBA1C 7.4* 12/02/2014   MICROALBUR 1.3 08/03/2014    No results found.  Assessment & Plan:   [place] was seen today for hypertension and diabetes.  Diagnoses and all orders for this visit:  Atherosclerosis of native coronary artery of native heart without angina pectoris- he has not had any angina recently, will cont risk factor modidifactions  Uncontrolled type 2 diabetes mellitus with complication, without long-term current use of insulin ([place])- his blood sugars are well controlled, renal function is stable -     metFORMIN (GLUCOPHAGE) 1000 MG tablet; Take 0.5 tablets (500 mg total) by mouth 2 (two) times daily with a meal. take half tablet twice daily with meal -     Basic metabolic panel; Future -     Hemoglobin A1c; Future  Essential hypertension, benign- his BP is well controlled, lytes and renal function are stable -     Basic metabolic panel; Future  I have changed [name] metFORMIN. I am also having him maintain his aspirin, glipiZIDE, carvedilol, ramipril, and rosuvastatin.  Meds ordered this encounter  Medications  . metFORMIN (GLUCOPHAGE) 1000 MG tablet    Sig: Take 0.5 tablets (500 mg total) by mouth 2 (two) times daily with a meal. take half tablet twice daily with meal    Dispense:  180 tablet    Refill:  1     Follow-up: Return in about 6 months (around 06/02/2015).  Arthur Calico, MD

## 2014-12-14 ENCOUNTER — Other Ambulatory Visit (INDEPENDENT_AMBULATORY_CARE_PROVIDER_SITE_OTHER): Payer: Commercial Managed Care - HMO | Admitting: *Deleted

## 2014-12-14 DIAGNOSIS — I2510 Atherosclerotic heart disease of native coronary artery without angina pectoris: Secondary | ICD-10-CM

## 2014-12-14 LAB — LIPID PANEL
Cholesterol: 115 mg/dL — ABNORMAL LOW (ref 125–200)
HDL: 43 mg/dL (ref >=40)
LDL Cholesterol: 50 mg/dL (ref <130)
TRIGLYCERIDES: 109 mg/dL (ref <150)
Total CHOL/HDL Ratio: 2.7 Ratio (ref <=5.0)
VLDL: 22 mg/dL (ref <30)

## 2014-12-14 LAB — HEPATIC FUNCTION PANEL
ALK PHOS: 57 U/L (ref 40–115)
ALT: 10 U/L (ref 9–46)
AST: 10 U/L (ref 10–35)
Albumin: 3.6 g/dL (ref 3.6–5.1)
Bilirubin, Direct: 0.1 mg/dL (ref <=0.2)
Indirect Bilirubin: 0.3 mg/dL (ref 0.2–1.2)
TOTAL PROTEIN: 6.9 g/dL (ref 6.1–8.1)
Total Bilirubin: 0.4 mg/dL (ref 0.2–1.2)

## 2015-01-03 ENCOUNTER — Encounter: Payer: Self-pay | Admitting: Neurology

## 2015-01-03 ENCOUNTER — Ambulatory Visit (INDEPENDENT_AMBULATORY_CARE_PROVIDER_SITE_OTHER): Payer: Commercial Managed Care - HMO | Admitting: Neurology

## 2015-01-03 VITALS — BP 120/74 | HR 75 | Resp 16 | Wt 134.0 lb

## 2015-01-03 DIAGNOSIS — R569 Unspecified convulsions: Secondary | ICD-10-CM | POA: Diagnosis not present

## 2015-01-03 NOTE — Patient Instructions (Signed)
1. Follow-up in 1 year, call for any changes 2. If you have another seizure, stop driving until 6 months seizure-free

## 2015-01-03 NOTE — Progress Notes (Signed)
NEUROLOGY FOLLOW UP OFFICE NOTE  Arthur Evans [id number]  HISTORY OF PRESENT ILLNESS: I had the pleasure of seeing Leib Asselta in follow-up in the neurology clinic on 01/03/2015.  The patient was last seen 5 months ago after a witnessed seizure on 06/07/14. He had not eaten the whole day, and glucose level was initially 80, then 65 in the ER. MRI brain and EEG unremarkable. He denies any further seizures or seizure-like symptoms. He denies any headaches, dizziness, diplopia, dysarthria/dysphagia, bowel/bladder dysfunction. He denies any olfactory/gustatory hallucinations, deja vu, rising epigastric sensation, focal numbness/tingling/weakness, myoclonic jerks. He is back to work and has been driving. He reports his glucose levels have been within range.   HPI: This is a 69 yo RH man with a history of hypertension, hyperlipidemia, and diabetes, in his usual state of health until the evening of 06/07/14. He did not eat anything all day, and recalls making milkshakes for his grandchildren. He then felt funny, like his eyes were jumping. He picked up a can and noticed his left hand was jerking more than the right, then he lost awareness and apparently fell to the floor, witnessed by his stepdaughter to have generalized shaking. He had bowel incontinence. After the episode, which per family lasted 45 minutes, he was not moving his left arm. He was given a total of 10mg  Versed because he was combative after. Per notes, EMS noted CBG of 80, BP 94/60, HR 104. He was back to baseline alert and oriented on arrival to ER. His initial CBG in the ER was 65. They report he was given juice, and repeat level was 108. CBC, CMP, UDS, EtOH level normal. He had a head CT which I personally reviewed, which did not show any acute changes, there was mild atrophy and chronic microvascular disease. He had a normal birth and early development. There is no history of febrile convulsions, CNS infections such as  meningitis/encephalitis, significant traumatic brain injury, neurosurgical procedures, or family history of seizures.   Diagnostic Data: I personally reviewed MRI brain with and without contrast which did not show any acute changes, there was mild diffuse atrophy and chronic microvascular disease, hippocampi symmetric with no abnormal signal or enhancement. His wake and sleep EEG was normal.   PAST MEDICAL HISTORY: Past Medical History  Diagnosis Date  . AAA (abdominal aortic aneurysm) ([place])   . History of trauma 72    [name] fell on him causing severe back injury  . Diabetes mellitus   . Arthritis   . Anxiety   . Depression   . Myocardial infarction [facility]) July 2010  . DVT (deep venous thrombosis) ([place])   . Seizure disorder [facility]) July 2016    MEDICATIONS: Current Outpatient Prescriptions on File Prior to Visit  Medication Sig Dispense Refill  . aspirin 81 MG tablet Take 1 tablet (81 mg total) by mouth daily.    . carvedilol (COREG) 6.25 MG tablet TAKE ONE TABLET BY MOUTH TWICE DAILY WITH MEALS. 60 tablet 3  . glipiZIDE (GLUCOTROL) 5 MG tablet TAKE ONE TABLET BY MOUTH ONCE DAILY 90 tablet 2  . metFORMIN (GLUCOPHAGE) 1000 MG tablet Take 0.5 tablets (500 mg total) by mouth 2 (two) times daily with a meal. take half tablet twice daily with meal 180 tablet 1  . ramipril (ALTACE) 2.5 MG capsule TAKE ONE CAPSULE BY MOUTH TWICE DAILY 60 capsule 11  . rosuvastatin (CRESTOR) 40 MG tablet Take 1 tablet (40 mg total) by mouth daily. 30 tablet 3  No current facility-administered medications on file prior to visit.    ALLERGIES: Allergies  Allergen Reactions  . Novocain [Procaine Hcl] Anxiety    nervousness    FAMILY HISTORY: Family History  Problem Relation Age of Onset  . Colon cancer Neg Hx   . Rectal cancer Neg Hx   . Stomach cancer Neg Hx   . Esophageal cancer Neg Hx   . Diabetes Mother   . Hyperlipidemia Mother   . Hypertension Mother   . Heart attack Mother   . Heart  disease Father     Before age 48  . Hyperlipidemia Father   . Hypertension Father   . Heart attack Father   . Diabetes Sister   . Hypertension Sister   . Heart attack Sister   . Heart disease Sister     After age 75  . Hyperlipidemia Sister   . Cancer Brother     Thyroid  . Hypertension Brother   . Heart disease Brother     after age 100  . Heart attack Brother     SOCIAL HISTORY: Social History   Social History  . Marital Status: Divorced    Spouse Name: N/A  . Number of Children: 4  . Years of Education: N/A   Occupational History  . Driver    Social History Main Topics  . Smoking status: Current Every Day Smoker -- 0.50 packs/day for 50 years    Types: Cigarettes, E-cigarettes  . Smokeless tobacco: Current User    Types: Chew     Comment: trying to quit; has decreased from 2 ppd to 1/2  ppd/ 8 packs   . Alcohol Use: No     Comment: none  . Drug Use: No  . Sexual Activity: Not Currently   Other Topics Concern  . Not on file   Social History Narrative    REVIEW OF SYSTEMS: Constitutional: No fevers, chills, or sweats, no generalized fatigue, change in appetite Eyes: No visual changes, double vision, eye pain Ear, nose and throat: No hearing loss, ear pain, nasal congestion, sore throat Cardiovascular: No chest pain, palpitations Respiratory:  No shortness of breath at rest or with exertion, wheezes GastrointestinaI: No nausea, vomiting, diarrhea, abdominal pain, fecal incontinence Genitourinary:  No dysuria, urinary retention or frequency Musculoskeletal:  + neck pain, back pain Integumentary: No rash, pruritus, skin lesions Neurological: as above Psychiatric: No depression, insomnia, anxiety Endocrine: No palpitations, fatigue, diaphoresis, mood swings, change in appetite, change in weight, increased thirst Hematologic/Lymphatic:  No anemia, purpura, petechiae. Allergic/Immunologic: no itchy/runny eyes, nasal congestion, recent allergic reactions,  rashes  PHYSICAL EXAM: Filed Vitals:   01/03/15 0835  BP: 120/74  Pulse: 75  Resp: 16   General: No acute distress Head:  Normocephalic/atraumatic Neck: supple, no paraspinal tenderness, full range of motion Heart:  Regular rate and rhythm Lungs:  Clear to auscultation bilaterally Back: No paraspinal tenderness Skin/Extremities: No rash, no edema Neurological Exam: alert and oriented to person, place, and time. No aphasia or dysarthria. Fund of knowledge is appropriate.  Recent and remote memory are intact. 3/3 delayed recall.  Attention and concentration are normal.    Able to name objects and repeat phrases. Cranial nerves: Pupils equal, round, reactive to light.  Fundoscopic exam unremarkable, no papilledema. Extraocular movements intact with no nystagmus. Visual fields full. Facial sensation intact. No facial asymmetry. Tongue, uvula, palate midline.  Motor: Bulk and tone normal, muscle strength 5/5 throughout with no pronator drift.  Sensation to light touch intact.  No extinction to double simultaneous stimulation.  Deep tendon reflexes 1+ throughout, toes downgoing.  Finger to nose testing intact.  Gait narrow-based and steady, able to tandem walk adequately.  Romberg negative.  IMPRESSION: This is a 69 yo RH man with a history of hypertension, hyperlipidemia, diabetes, who had a seizure last 06/07/14. There is concern that the seizure was due to hypoglycemia, however EMS report of CBG was 80, which is not significantly low. It was 65 in the ER. They report his usual levels run at 130. 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 MRI brain with and without contrast and routine EEG normal. He denies any further seizures or seizure-like symptoms since May 2016. We again discussed that after an initial seizure, unless there are significant risk factors, an abnormal neurological exam, an EEG showing epileptiform abnormalities, and/or  abnormal neuroimaging, treatment with an antiepileptic drug is not indicated. He reports glucose levels have been better and within range. We again discussed that in the event of another seizure, further testing can be done such as prolonged EEG. We again discussed [place] driving restrictions which indicate a patient needs to free of seizures or events of altered awareness for 6 months prior to resuming driving. He will follow-up in 1 year and knows to call our office for any changes.  Thank you for allowing me to participate in his care.  Please do not hesitate to call for any questions or concerns.  The duration of this appointment visit was 15 minutes of face-to-face time with the patient.  Greater than 50% of this time was spent in counseling, explanation of diagnosis, planning of further management, and coordination of care.   Ellouise Newer, M.D.   CC: Dr. Ronnald Ramp

## 2015-01-10 ENCOUNTER — Other Ambulatory Visit: Payer: Self-pay | Admitting: Cardiology

## 2015-03-08 ENCOUNTER — Other Ambulatory Visit: Payer: Self-pay | Admitting: Cardiology

## 2015-03-08 ENCOUNTER — Other Ambulatory Visit: Payer: Self-pay | Admitting: Internal Medicine

## 2015-03-08 LAB — HM DIABETES EYE EXAM

## 2015-03-28 ENCOUNTER — Encounter: Payer: Self-pay | Admitting: Internal Medicine

## 2015-05-11 ENCOUNTER — Other Ambulatory Visit: Payer: Self-pay | Admitting: Internal Medicine

## 2015-07-05 ENCOUNTER — Inpatient Hospital Stay (HOSPITAL_COMMUNITY): Admission: RE | Admit: 2015-07-05 | Payer: Commercial Managed Care - HMO | Source: Ambulatory Visit

## 2015-07-13 ENCOUNTER — Ambulatory Visit (HOSPITAL_COMMUNITY)
Admission: RE | Admit: 2015-07-13 | Discharge: 2015-07-13 | Disposition: A | Discharge status: Home / Self Care | Payer: Commercial Managed Care - HMO | Source: Ambulatory Visit | Attending: Cardiovascular Disease | Admitting: Cardiovascular Disease

## 2015-07-13 DIAGNOSIS — E119 Type 2 diabetes mellitus without complications: Secondary | ICD-10-CM | POA: Insufficient documentation

## 2015-07-13 DIAGNOSIS — F419 Anxiety disorder, unspecified: Secondary | ICD-10-CM | POA: Diagnosis not present

## 2015-07-13 DIAGNOSIS — F329 Major depressive disorder, single episode, unspecified: Secondary | ICD-10-CM | POA: Diagnosis not present

## 2015-07-13 DIAGNOSIS — I6501 Occlusion and stenosis of right vertebral artery: Secondary | ICD-10-CM | POA: Diagnosis not present

## 2015-07-13 DIAGNOSIS — I6523 Occlusion and stenosis of bilateral carotid arteries: Secondary | ICD-10-CM | POA: Diagnosis present

## 2015-07-25 ENCOUNTER — Other Ambulatory Visit: Payer: Self-pay

## 2015-07-25 ENCOUNTER — Inpatient Hospital Stay (HOSPITAL_COMMUNITY): Payer: Commercial Managed Care - HMO

## 2015-07-25 ENCOUNTER — Encounter (HOSPITAL_COMMUNITY): Payer: Self-pay | Admitting: Emergency Medicine

## 2015-07-25 ENCOUNTER — Emergency Department (HOSPITAL_COMMUNITY): Payer: Commercial Managed Care - HMO

## 2015-07-25 ENCOUNTER — Inpatient Hospital Stay (HOSPITAL_COMMUNITY)
Admission: EM | Admit: 2015-07-25 | Discharge: 2015-08-06 | DRG: 853 | Disposition: E | Payer: Commercial Managed Care - HMO | Attending: Pulmonary Disease | Admitting: Pulmonary Disease

## 2015-07-25 DIAGNOSIS — G9341 Metabolic encephalopathy: Secondary | ICD-10-CM | POA: Diagnosis present

## 2015-07-25 DIAGNOSIS — R0902 Hypoxemia: Secondary | ICD-10-CM

## 2015-07-25 DIAGNOSIS — Z515 Encounter for palliative care: Secondary | ICD-10-CM | POA: Diagnosis not present

## 2015-07-25 DIAGNOSIS — J9601 Acute respiratory failure with hypoxia: Secondary | ICD-10-CM | POA: Diagnosis not present

## 2015-07-25 DIAGNOSIS — R6521 Severe sepsis with septic shock: Secondary | ICD-10-CM

## 2015-07-25 DIAGNOSIS — Z7984 Long term (current) use of oral hypoglycemic drugs: Secondary | ICD-10-CM

## 2015-07-25 DIAGNOSIS — E876 Hypokalemia: Secondary | ICD-10-CM | POA: Diagnosis not present

## 2015-07-25 DIAGNOSIS — I2510 Atherosclerotic heart disease of native coronary artery without angina pectoris: Secondary | ICD-10-CM | POA: Diagnosis present

## 2015-07-25 DIAGNOSIS — Z9289 Personal history of other medical treatment: Secondary | ICD-10-CM

## 2015-07-25 DIAGNOSIS — R0600 Dyspnea, unspecified: Secondary | ICD-10-CM | POA: Diagnosis not present

## 2015-07-25 DIAGNOSIS — Z66 Do not resuscitate: Secondary | ICD-10-CM | POA: Diagnosis not present

## 2015-07-25 DIAGNOSIS — G9340 Encephalopathy, unspecified: Secondary | ICD-10-CM | POA: Insufficient documentation

## 2015-07-25 DIAGNOSIS — J189 Pneumonia, unspecified organism: Secondary | ICD-10-CM

## 2015-07-25 DIAGNOSIS — R000 Tachycardia, unspecified: Secondary | ICD-10-CM | POA: Diagnosis present

## 2015-07-25 DIAGNOSIS — A419 Sepsis, unspecified organism: Principal | ICD-10-CM | POA: Diagnosis present

## 2015-07-25 DIAGNOSIS — R911 Solitary pulmonary nodule: Secondary | ICD-10-CM | POA: Diagnosis present

## 2015-07-25 DIAGNOSIS — J9600 Acute respiratory failure, unspecified whether with hypoxia or hypercapnia: Secondary | ICD-10-CM

## 2015-07-25 DIAGNOSIS — G40909 Epilepsy, unspecified, not intractable, without status epilepticus: Secondary | ICD-10-CM | POA: Diagnosis present

## 2015-07-25 DIAGNOSIS — J9621 Acute and chronic respiratory failure with hypoxia: Secondary | ICD-10-CM | POA: Diagnosis present

## 2015-07-25 DIAGNOSIS — C9200 Acute myeloblastic leukemia, not having achieved remission: Secondary | ICD-10-CM | POA: Insufficient documentation

## 2015-07-25 DIAGNOSIS — E43 Unspecified severe protein-calorie malnutrition: Secondary | ICD-10-CM | POA: Insufficient documentation

## 2015-07-25 DIAGNOSIS — I509 Heart failure, unspecified: Secondary | ICD-10-CM | POA: Diagnosis not present

## 2015-07-25 DIAGNOSIS — Z681 Body mass index (BMI) 19 or less, adult: Secondary | ICD-10-CM | POA: Diagnosis not present

## 2015-07-25 DIAGNOSIS — R918 Other nonspecific abnormal finding of lung field: Secondary | ICD-10-CM | POA: Diagnosis not present

## 2015-07-25 DIAGNOSIS — A770 Spotted fever due to Rickettsia rickettsii: Secondary | ICD-10-CM | POA: Diagnosis present

## 2015-07-25 DIAGNOSIS — N179 Acute kidney failure, unspecified: Secondary | ICD-10-CM | POA: Diagnosis present

## 2015-07-25 DIAGNOSIS — E11649 Type 2 diabetes mellitus with hypoglycemia without coma: Secondary | ICD-10-CM | POA: Diagnosis present

## 2015-07-25 DIAGNOSIS — C9500 Acute leukemia of unspecified cell type not having achieved remission: Secondary | ICD-10-CM | POA: Diagnosis present

## 2015-07-25 DIAGNOSIS — I714 Abdominal aortic aneurysm, without rupture, unspecified: Secondary | ICD-10-CM

## 2015-07-25 DIAGNOSIS — R042 Hemoptysis: Secondary | ICD-10-CM | POA: Insufficient documentation

## 2015-07-25 DIAGNOSIS — E872 Acidosis: Secondary | ICD-10-CM | POA: Diagnosis present

## 2015-07-25 DIAGNOSIS — J441 Chronic obstructive pulmonary disease with (acute) exacerbation: Secondary | ICD-10-CM | POA: Diagnosis present

## 2015-07-25 DIAGNOSIS — Z86718 Personal history of other venous thrombosis and embolism: Secondary | ICD-10-CM | POA: Diagnosis not present

## 2015-07-25 DIAGNOSIS — R001 Bradycardia, unspecified: Secondary | ICD-10-CM | POA: Diagnosis not present

## 2015-07-25 DIAGNOSIS — Z7982 Long term (current) use of aspirin: Secondary | ICD-10-CM

## 2015-07-25 DIAGNOSIS — F17210 Nicotine dependence, cigarettes, uncomplicated: Secondary | ICD-10-CM | POA: Diagnosis present

## 2015-07-25 DIAGNOSIS — J440 Chronic obstructive pulmonary disease with acute lower respiratory infection: Secondary | ICD-10-CM | POA: Diagnosis present

## 2015-07-25 DIAGNOSIS — R740 Nonspecific elevation of levels of transaminase and lactic acid dehydrogenase [LDH]: Secondary | ICD-10-CM | POA: Diagnosis not present

## 2015-07-25 DIAGNOSIS — Z4659 Encounter for fitting and adjustment of other gastrointestinal appliance and device: Secondary | ICD-10-CM

## 2015-07-25 DIAGNOSIS — I5032 Chronic diastolic (congestive) heart failure: Secondary | ICD-10-CM | POA: Diagnosis present

## 2015-07-25 DIAGNOSIS — I252 Old myocardial infarction: Secondary | ICD-10-CM | POA: Diagnosis not present

## 2015-07-25 DIAGNOSIS — D61818 Other pancytopenia: Secondary | ICD-10-CM | POA: Diagnosis present

## 2015-07-25 DIAGNOSIS — Z01818 Encounter for other preprocedural examination: Secondary | ICD-10-CM

## 2015-07-25 DIAGNOSIS — E46 Unspecified protein-calorie malnutrition: Secondary | ICD-10-CM | POA: Diagnosis present

## 2015-07-25 DIAGNOSIS — R0603 Acute respiratory distress: Secondary | ICD-10-CM

## 2015-07-25 DIAGNOSIS — D649 Anemia, unspecified: Secondary | ICD-10-CM | POA: Diagnosis present

## 2015-07-25 LAB — CBC WITH DIFFERENTIAL/PLATELET
BLASTS: 0 %
Band Neutrophils: 0 %
Basophils Absolute: 0 10*3/uL (ref 0.0–0.1)
Basophils Relative: 0 %
EOS PCT: 0 %
Eosinophils Absolute: 0 10*3/uL (ref 0.0–0.7)
HEMATOCRIT: 13.9 % — AB (ref 39.0–52.0)
HEMOGLOBIN: 4.3 g/dL — AB (ref 13.0–17.0)
LYMPHS ABS: 2.5 10*3/uL (ref 0.7–4.0)
Lymphocytes Relative: 84 %
MCH: 31.2 pg (ref 26.0–34.0)
MCHC: 30.9 g/dL (ref 30.0–36.0)
MCV: 100.7 fL — AB (ref 78.0–100.0)
MONOS PCT: 13 %
MYELOCYTES: 0 %
Metamyelocytes Relative: 0 %
Monocytes Absolute: 0.4 10*3/uL (ref 0.1–1.0)
NRBC: 0 /100{WBCs}
Neutro Abs: 0.1 10*3/uL — ABNORMAL LOW (ref 1.7–7.7)
Neutrophils Relative %: 3 %
Other: 0 %
Platelets: 22 10*3/uL — CL (ref 150–400)
Promyelocytes Absolute: 0 %
RBC: 1.38 MIL/uL — AB (ref 4.22–5.81)
RDW: 17.3 % — AB (ref 11.5–15.5)
WBC: 3 10*3/uL — ABNORMAL LOW (ref 4.0–10.5)

## 2015-07-25 LAB — I-STAT ARTERIAL BLOOD GAS, ED
Acid-base deficit: 6 mmol/L — ABNORMAL HIGH (ref 0.0–2.0)
Acid-base deficit: 14 mmol/L — ABNORMAL HIGH (ref 0.0–2.0)
BICARBONATE: 17.8 meq/L — AB (ref 20.0–24.0)
Bicarbonate: 13.7 mEq/L — ABNORMAL LOW (ref 20.0–24.0)
O2 Saturation: 96 %
O2 Saturation: 98 %
PCO2 ART: 30.4 mmHg — AB (ref 35.0–45.0)
PCO2 ART: 36.9 mmHg (ref 35.0–45.0)
PH ART: 7.169 — AB (ref 7.350–7.450)
PO2 ART: 89 mmHg (ref 80.0–100.0)
TCO2: 19 mmol/L (ref 0–100)
TCO2: 15 mmol/L (ref 0–100)
pH, Arterial: 7.383 (ref 7.350–7.450)
pO2, Arterial: 129 mmHg — ABNORMAL HIGH (ref 80.0–100.0)

## 2015-07-25 LAB — URINE MICROSCOPIC-ADD ON

## 2015-07-25 LAB — URINALYSIS, ROUTINE W REFLEX MICROSCOPIC
GLUCOSE, UA: 100 mg/dL — AB
KETONES UR: 15 mg/dL — AB
LEUKOCYTES UA: NEGATIVE
Nitrite: NEGATIVE
PH: 5.5 (ref 5.0–8.0)
Protein, ur: 100 mg/dL — AB
SPECIFIC GRAVITY, URINE: 1.027 (ref 1.005–1.030)

## 2015-07-25 LAB — COMPREHENSIVE METABOLIC PANEL
ALBUMIN: 2.3 g/dL — AB (ref 3.5–5.0)
ALT: 15 U/L — ABNORMAL LOW (ref 17–63)
AST: 24 U/L (ref 15–41)
Alkaline Phosphatase: 32 U/L — ABNORMAL LOW (ref 38–126)
Anion gap: 10 (ref 5–15)
BUN: 30 mg/dL — AB (ref 6–20)
CHLORIDE: 106 mmol/L (ref 101–111)
CO2: 19 mmol/L — AB (ref 22–32)
Calcium: 7.7 mg/dL — ABNORMAL LOW (ref 8.9–10.3)
Creatinine, Ser: 1.7 mg/dL — ABNORMAL HIGH (ref 0.61–1.24)
GFR calc Af Amer: 45 mL/min — ABNORMAL LOW (ref >60)
GFR calc non Af Amer: 39 mL/min — ABNORMAL LOW (ref >60)
GLUCOSE: 190 mg/dL — AB (ref 65–99)
POTASSIUM: 4 mmol/L (ref 3.5–5.1)
Sodium: 135 mmol/L (ref 135–145)
Total Bilirubin: 1 mg/dL (ref 0.3–1.2)
Total Protein: 5.8 g/dL — ABNORMAL LOW (ref 6.5–8.1)

## 2015-07-25 LAB — I-STAT TROPONIN, ED: Troponin i, poc: 0.07 ng/mL (ref 0.00–0.08)

## 2015-07-25 LAB — I-STAT CG4 LACTIC ACID, ED
LACTIC ACID, VENOUS: 4.88 mmol/L — AB (ref 0.5–2.0)
LACTIC ACID, VENOUS: 6.62 mmol/L — AB (ref 0.5–2.0)

## 2015-07-25 LAB — POC OCCULT BLOOD, ED: Fecal Occult Bld: NEGATIVE

## 2015-07-25 LAB — PREPARE RBC (CROSSMATCH)

## 2015-07-25 LAB — ABO/RH: ABO/RH(D): O POS

## 2015-07-25 MED ORDER — FENTANYL BOLUS VIA INFUSION
25.0000 ug | INTRAVENOUS | Status: DC | PRN
  Filled 2015-07-25: qty 25

## 2015-07-25 MED ORDER — MIDAZOLAM HCL 2 MG/2ML IJ SOLN: 1.0000 mg | INTRAMUSCULAR | Status: DC | PRN

## 2015-07-25 MED ORDER — ETOMIDATE 2 MG/ML IV SOLN
0.3000 mg/kg | Freq: Once | INTRAVENOUS | Status: AC
  Administered 2015-07-25: 10 mg via INTRAVENOUS

## 2015-07-25 MED ORDER — INSULIN ASPART 100 UNIT/ML ~~LOC~~ SOLN
2.0000 [IU] | SUBCUTANEOUS | Status: DC
  Administered 2015-07-26: 6 [IU] via SUBCUTANEOUS

## 2015-07-25 MED ORDER — FENTANYL CITRATE (PF) 100 MCG/2ML IJ SOLN
INTRAMUSCULAR | Status: AC
  Filled 2015-07-25: qty 2

## 2015-07-25 MED ORDER — SODIUM CHLORIDE 0.9 % IV SOLN
0.2500 mg/kg/h | INTRAVENOUS | Status: DC
  Administered 2015-07-25: 0.25 mg/kg/h via INTRAVENOUS
  Filled 2015-07-25: qty 2

## 2015-07-25 MED ORDER — SODIUM CHLORIDE 0.9 % IV SOLN
250.0000 mL | INTRAVENOUS | Status: DC | PRN
  Administered 2015-08-03: 250 mL via INTRAVENOUS

## 2015-07-25 MED ORDER — SODIUM CHLORIDE 0.9 % IV BOLUS (SEPSIS)
1000.0000 mL | Freq: Once | INTRAVENOUS | Status: AC
  Administered 2015-07-25: 1000 mL via INTRAVENOUS

## 2015-07-25 MED ORDER — SODIUM CHLORIDE 0.9 % IV SOLN
10.0000 mL/h | Freq: Once | INTRAVENOUS | Status: AC
  Administered 2015-07-25: 10 mL/h via INTRAVENOUS

## 2015-07-25 MED ORDER — MIDAZOLAM HCL 2 MG/2ML IJ SOLN
2.0000 mg | Freq: Once | INTRAMUSCULAR | Status: AC
  Administered 2015-07-25: 2 mg via INTRAVENOUS

## 2015-07-25 MED ORDER — DEXTROSE 5 % IV SOLN
500.0000 mg | Freq: Once | INTRAVENOUS | Status: AC
  Administered 2015-07-25: 500 mg via INTRAVENOUS
  Filled 2015-07-25: qty 500

## 2015-07-25 MED ORDER — FENTANYL CITRATE (PF) 100 MCG/2ML IJ SOLN
50.0000 ug | Freq: Once | INTRAMUSCULAR | Status: AC
  Administered 2015-07-25: 50 ug via INTRAVENOUS

## 2015-07-25 MED ORDER — DEXTROSE 5 % IV SOLN
1.0000 g | Freq: Once | INTRAVENOUS | Status: AC
  Administered 2015-07-25: 1 g via INTRAVENOUS
  Filled 2015-07-25: qty 10

## 2015-07-25 MED ORDER — ALBUTEROL SULFATE (2.5 MG/3ML) 0.083% IN NEBU
5.0000 mg | INHALATION_SOLUTION | Freq: Once | RESPIRATORY_TRACT | Status: AC
  Administered 2015-07-25: 5 mg via RESPIRATORY_TRACT
  Filled 2015-07-25: qty 6

## 2015-07-25 MED ORDER — FENTANYL CITRATE (PF) 100 MCG/2ML IJ SOLN
50.0000 ug | Freq: Once | INTRAMUSCULAR | Status: AC
  Administered 2015-07-25: 50 ug via INTRAVENOUS
  Filled 2015-07-25: qty 2

## 2015-07-25 MED ORDER — SUCCINYLCHOLINE CHLORIDE 20 MG/ML IJ SOLN
120.0000 mg | Freq: Once | INTRAMUSCULAR | Status: AC
  Administered 2015-07-25: 120 mg via INTRAVENOUS

## 2015-07-25 MED ORDER — AZITHROMYCIN 500 MG IV SOLR
500.0000 mg | INTRAVENOUS | Status: DC
  Filled 2015-07-25: qty 500

## 2015-07-25 MED ORDER — IPRATROPIUM-ALBUTEROL 0.5-2.5 (3) MG/3ML IN SOLN
3.0000 mL | Freq: Four times a day (QID) | RESPIRATORY_TRACT | Status: DC
  Administered 2015-07-26 – 2015-07-29 (×16): 3 mL via RESPIRATORY_TRACT
  Filled 2015-07-25 (×17): qty 3

## 2015-07-25 MED ORDER — SODIUM CHLORIDE 0.9 % IV SOLN
25.0000 ug/h | INTRAVENOUS | Status: DC
  Administered 2015-07-25: 50 ug/h via INTRAVENOUS
  Filled 2015-07-25: qty 50

## 2015-07-25 MED ORDER — METHYLPREDNISOLONE SODIUM SUCC 125 MG IJ SOLR
125.0000 mg | Freq: Once | INTRAMUSCULAR | Status: AC
  Administered 2015-07-25: 125 mg via INTRAVENOUS
  Filled 2015-07-25: qty 2

## 2015-07-25 MED ORDER — METHYLPREDNISOLONE SODIUM SUCC 125 MG IJ SOLR
60.0000 mg | Freq: Two times a day (BID) | INTRAMUSCULAR | Status: DC
  Administered 2015-07-26: 60 mg via INTRAVENOUS
  Filled 2015-07-25 (×2): qty 2

## 2015-07-25 MED ORDER — MIDAZOLAM HCL 2 MG/2ML IJ SOLN
INTRAMUSCULAR | Status: AC
  Filled 2015-07-25: qty 2

## 2015-07-25 MED ORDER — ASPIRIN 300 MG RE SUPP: 300.0000 mg | RECTAL | Status: DC

## 2015-07-25 MED ORDER — ASPIRIN 81 MG PO CHEW: 324.0000 mg | CHEWABLE_TABLET | ORAL | Status: DC

## 2015-07-25 MED ORDER — SODIUM CHLORIDE 0.9 % IV SOLN
INTRAVENOUS | Status: DC
  Administered 2015-07-26: 10 mL/h via INTRAVENOUS

## 2015-07-25 MED ORDER — FAMOTIDINE IN NACL 20-0.9 MG/50ML-% IV SOLN
20.0000 mg | Freq: Two times a day (BID) | INTRAVENOUS | Status: DC
  Administered 2015-07-26: 20 mg via INTRAVENOUS
  Filled 2015-07-25 (×2): qty 50

## 2015-07-25 MED ORDER — ALBUTEROL SULFATE (2.5 MG/3ML) 0.083% IN NEBU
2.5000 mg | INHALATION_SOLUTION | RESPIRATORY_TRACT | Status: DC | PRN
  Administered 2015-07-27 – 2015-07-30 (×2): 2.5 mg via RESPIRATORY_TRACT
  Filled 2015-07-25 (×2): qty 3

## 2015-07-25 MED ORDER — STERILE WATER FOR INJECTION IV SOLN
INTRAVENOUS | Status: DC
  Administered 2015-07-26: 01:00:00 via INTRAVENOUS
  Filled 2015-07-25 (×2): qty 850

## 2015-07-25 MED ORDER — DEXTROSE 5 % IV SOLN
1.0000 g | INTRAVENOUS | Status: DC
  Administered 2015-07-26 – 2015-07-31 (×6): 1 g via INTRAVENOUS
  Filled 2015-07-25 (×6): qty 10

## 2015-07-25 NOTE — H&P (Signed)
PULMONARY / CRITICAL CARE MEDICINE   Name: Arthur Evans. MRN: [id number] DOB: 11-29-1945    ADMISSION DATE:  07/26/2015 CONSULTATION DATE:  6/19  REFERRING MD:  Dr. Jeanell Sparrow EDP  CHIEF COMPLAINT:  AMS  HISTORY OF PRESENT ILLNESS:   70 year old male with PMH as below, which includes AAA, DM on metformin, MI, DVT, and seizure disorder (thought due to hypoglycemia, however. At baseline he is an independent man who lives alone and works driving cars for a [employer]. He was working last week and was feeling well enough to work 60+ hours per son. He presented [facility]/19 with complaints of generalized weakness for 4 days. In ED he was found to be confused and hypotensive. He denied and symptoms of GI bleed, but hemoglobin was found to be 4.3. He required intubation for airway protection. He was transfused 2 units of emergent release blood and PCCM was called for admission.   PAST MEDICAL HISTORY :  He  has a past medical history of AAA (abdominal aortic aneurysm) ([place]); History of trauma (1983); Diabetes mellitus; Arthritis; Anxiety; Depression; Myocardial infarction [facility]) (July 2010); DVT (deep venous thrombosis) ([place]); and Seizure disorder [facility]) (July 2016).  PAST SURGICAL HISTORY: He  has past surgical history that includes Back surgery (1983); Vasectomy; Cardiac catheterization; and Spine surgery.  Allergies  Allergen Reactions  . Novocain [Procaine Hcl] Anxiety    nervousness    No current facility-administered medications on file prior to encounter.   Current Outpatient Prescriptions on File Prior to Encounter  Medication Sig  . aspirin 81 MG tablet Take 1 tablet (81 mg total) by mouth daily.  . carvedilol (COREG) 6.25 MG tablet TAKE ONE TABLET BY MOUTH TWICE DAILY WITH MEALS  . glipiZIDE (GLUCOTROL) 5 MG tablet TAKE ONE TABLET BY MOUTH ONCE DAILY  . metFORMIN (GLUCOPHAGE) 1000 MG tablet Take 0.5 tablets (500 mg total) by mouth 2 (two) times daily with a meal. take half tablet  twice daily with meal (Patient taking differently: Take 500 mg by mouth 2 (two) times daily with a meal. )  . ramipril (ALTACE) 2.5 MG capsule TAKE ONE CAPSULE BY MOUTH TWICE DAILY  . rosuvastatin (CRESTOR) 40 MG tablet TAKE ONE TABLET BY MOUTH ONCE DAILY    FAMILY HISTORY:  His indicated that his mother is deceased. He indicated that his father is deceased. He indicated that his sister is alive. He indicated that his brother is deceased.   SOCIAL HISTORY: He  reports that he has been smoking Cigarettes and E-cigarettes.  He has been smoking about 0.00 packs per day for the past 50 years. He uses smokeless tobacco. He reports that he does not drink alcohol or use illicit drugs.  REVIEW OF SYSTEMS:   Unable due to encephalopathy  SUBJECTIVE:    VITAL SIGNS: BP 98/55 mmHg  Pulse 87  Temp(Src) 95.9 F (35.5 C) (Oral)  Resp 21  Ht 5\' 8"  (1.727 m)  Wt 61.236 kg (135 lb)  BMI 20.53 kg/m2  SpO2 96%  HEMODYNAMICS:    VENTILATOR SETTINGS: Vent Mode:  [-] PRVC FiO2 (%):  [60 %] 60 % Set Rate:  [20 bmp-26 bmp] 26 bmp Vt Set:  [550 mL] 550 mL PEEP:  [5 cmH20] 5 cmH20 Plateau Pressure:  [24 cmH20] 24 cmH20  INTAKE / OUTPUT:    PHYSICAL EXAMINATION: General:  Acutely ill appearing male in NAD on vent Neuro:  Awake on vent, follows commands.  HEENT:  [place]/AT, PERRL, pale conjunctiva  Cardiovascular:  RRR, no MRG Lungs:  Expiratory wheeze Abdomen:  Soft, non-distended Musculoskeletal:  No acute deformity or ROM limitation Skin:  Grossly intact, pallor  LABS:  BMET  Recent Labs Lab 07/07/2015 2018  NA 135  K 4.0  CL 106  CO2 19*  BUN 30*  CREATININE 1.70*  GLUCOSE 190*    Electrolytes  Recent Labs Lab 07/17/2015 2018  CALCIUM 7.7*    CBC  Recent Labs Lab 07/31/2015 2018  WBC 3.0*  HGB 4.3*  HCT 13.9*  PLT 22*    Coag's No results for input(s): APTT, INR in the last 168 hours.  Sepsis Markers  Recent Labs Lab 07/10/2015 2032 07/09/2015 2231   LATICACIDVEN 4.88* 6.62*    ABG  Recent Labs Lab 07/22/2015 2010 07/21/2015 2228  PHART 7.383 7.169*  PCO2ART 30.4* 36.9  PO2ART 89.0 129.0*    Liver Enzymes  Recent Labs Lab 08/03/2015 2018  AST 24  ALT 15*  ALKPHOS 32*  BILITOT 1.0  ALBUMIN 2.3*    Cardiac Enzymes No results for input(s): TROPONINI, PROBNP in the last 168 hours.  Glucose No results for input(s): GLUCAP in the last 168 hours.  Imaging Dg Chest Portable 1 View  07/09/2015  CLINICAL DATA:  Endotracheal tube and orogastric tube placement. Generalized weakness and nausea. Confusion. Initial encounter. EXAM: PORTABLE CHEST 1 VIEW COMPARISON:  Chest radiograph performed earlier today at 7:56 p.m. FINDINGS: The patient's endotracheal tube is seen ending 3-4 cm above the carina. An enteric tube is noted extending below the diaphragm. Vascular congestion is noted, somewhat more prominent than on the recent prior study, with perihilar opacity, which may reflect pneumonia or pulmonary edema. No pleural effusion or pneumothorax is seen The cardiomediastinal silhouette remains normal in size. No acute osseous abnormalities are identified. Thoracolumbar spinal fusion hardware is partially imaged. IMPRESSION: 1. Endotracheal tube seen ending 3-4 cm above the carina. 2. Enteric tube noted extending below the diaphragm. 3. Vascular congestion noted, somewhat more prominent than on the recent prior study, with perihilar opacity, which may reflect pneumonia or pulmonary edema. Electronically Signed   By: Garald Balding M.D.   On: 07/24/2015 21:57   Dg Chest Port 1 View  07/28/2015  CLINICAL DATA:  Dyspnea and cough for an unknown period of time. Neighbor reports the pt has been experiencing nausea, anorexia, and weakness. Hx of DM, MI, AND COPD. Pt is a current smoker. EXAM: PORTABLE CHEST - 1 VIEW COMPARISON:  06/07/2014 FINDINGS: Patchy right perihilar airspace disease, new since previous. Left lung clear. Heart size normal.  Mildly  tortuous atheromatous aorta. No effusion. No pneumothorax.Surgical fixation hardware overlies the lower thoracic spine, incompletely visualized distally. IMPRESSION: 1. Patchy right perihilar airspace disease, possibly infectious/inflammatory process. Consider follow-up to confirm appropriate resolution, exclude neoplasm. Electronically Signed   By: Lucrezia Europe M.D.   On: 07/31/2015 20:08    STUDIES:  CT abd 6/19 >>>  CULTURES: BCx2 6/19 > Urine 6/19 >  ANTIBIOTICS: Ceftriaxone 6/19 > Azithromycin 6/19 >  SIGNIFICANT EVENTS: 6/19 admit  LINES/TUBES: ETT 6/19 >>>  DISCUSSION: 70 year old male presented with AMS requiring intubation. Found to have pancytopenia requiring 4 units PRBC. Remained hypotensive. Concern septic shock. Stabilize on vent with CAP coverage.  Serial CBC and transfusion as needed. Nebs and steroids for COPD exacerbation. CT abd pending.   ASSESSMENT / PLAN:  PULMONARY A: Inability to protect airway in setting AMS COPD with acute exacerbation Possible CAP vs aspiration L lung nodule from prior CT  abdomen  P:   Full vent support Follow ABG CXR in AM Scheduled Duonebs, PRN albuterol Solumedrol 60mg  q12 hour  CARDIOVASCULAR A:  Shock - still hypotensive despite 5L NS and 3 units PRBC. Suspect septic consider hypovolmia/hemmorhagic as well.  Chronic diastolic CHF (Grade 1 DD) AAA 3.88x3.81 CM Lactic rising H/o CAD  P:  Telemetry Blood product resuscitation for BP goals MAP goal > 90mmHg May need CVL, pressors CT abd to assess aneurysm Echo Trend lactic, troponin  RENAL A:   AKI Pseudohypocalcemia (corrects to 9.1) AGMA - lactic  P:   Follow BMP Correct electrolytes as indicated NS to KVO  GASTROINTESTINAL A:   No acute issues  P:   NPO Pepcid for SUP  HEMATOLOGIC A:   Severe pancytopenia - etiology unclear, could be due to profound infection.   P:  Transfuse 2 additional units PRBC (4 total) PT/INR Assess folic acic, [id number],  reticulocyte, peripheral smear. Consider hematology consult if not improving.   INFECTIOUS A:   SIRS/Sepsis ? CAP vs aspiration  P:   ABX as above Trend PCT Monitor WBC and fever curves  ENDOCRINE A:   DM  P:   Hold metformin, glipizide CBG monitoring and SSI  NEUROLOGIC A:   Acute metabolic encephalopathy  P:   RASS -1 to -2 Fentanyl gtt, PRN versed Daily WUA  FAMILY  - Updates: Family updated at bedside in ED by RA   - Inter-disciplinary family meet or Palliative Care meeting due by:  6/19   Georgann Housekeeper, AGACNP-BC [place] [facility]monology/Critical Care Pager [phone] or [phone]  07/28/2015 11:25 PM

## 2015-07-25 NOTE — Progress Notes (Signed)
RT transported patient from ED to CT and up to 62M with any complications.

## 2015-07-25 NOTE — Progress Notes (Signed)
RT increased RR to 26 per MD verbal order.

## 2015-07-25 NOTE — ED Notes (Signed)
Son signed consent form for blood transfusion for pt.

## 2015-07-25 NOTE — ED Notes (Addendum)
Transported to CT scan and then to ICU 2M04 .

## 2015-07-25 NOTE — ED Notes (Addendum)
Pt. arrived with EMS from home neighbor reported generalized weakness , nausea , confused and anorexia for several days .

## 2015-07-25 NOTE — ED Notes (Signed)
2 UNITS PRBC COMPLETED .

## 2015-07-25 NOTE — ED Notes (Addendum)
Dr. Elsworth Soho ( intensivist) at bedside evaluating pt. ,  he explained pt. status and plan of care to pt.'s family. , 3rd unit PRBC infusing wide open due to persistent hypotension .

## 2015-07-25 NOTE — ED Provider Notes (Signed)
CSN: [id number]     Arrival date & time 07/12/2015  1931 History   First MD Initiated Contact with Patient 07/11/2015 1934     Chief Complaint  Patient presents with  . Fatigue  . Nausea  . Altered Mental Status   Level V caveat secondary to altered mental status and acuity of condition  (HPI Limited history is obtained due to patient's weakness, confusion, and severe illness.  History is obtained from EMS. This is a 70 year old man history of abdominal aortic aneurysm, diabetes, MI, and DVT with seizure disorder who presents today with altered mental status, generalized weakness and increasing confusion. He lives alone. He states he has been feeling sick for several days. He endorses some shortness of breath. He denies any hematemesis or rectal bleeding. Past Medical History  Diagnosis Date  . AAA (abdominal aortic aneurysm) ([place])   . History of trauma 43    [name] fell on him causing severe back injury  . Diabetes mellitus   . Arthritis   . Anxiety   . Depression   . Myocardial infarction [facility]) July 2010  . DVT (deep venous thrombosis) ([place])   . Seizure disorder [facility]) July 2016   Past Surgical History  Procedure Laterality Date  . Back surgery  1983  . Vasectomy    . Cardiac catheterization      cardiac stents  . Spine surgery     Family History  Problem Relation Age of Onset  . Colon cancer Neg Hx   . Rectal cancer Neg Hx   . Stomach cancer Neg Hx   . Esophageal cancer Neg Hx   . Diabetes Mother   . Hyperlipidemia Mother   . Hypertension Mother   . Heart attack Mother   . Heart disease Father     Before age 40  . Hyperlipidemia Father   . Hypertension Father   . Heart attack Father   . Diabetes Sister   . Hypertension Sister   . Heart attack Sister   . Heart disease Sister     After age 41  . Hyperlipidemia Sister   . Cancer Brother     Thyroid  . Hypertension Brother   . Heart disease Brother     after age 98  . Heart attack Brother    Social History   Substance Use Topics  . Smoking status: Current Every Day Smoker -- 0.00 packs/day for 50 years    Types: Cigarettes, E-cigarettes  . Smokeless tobacco: Current User     Comment: trying to quit; has decreased from 2 ppd to 1/2  ppd/ 8 packs   . Alcohol Use: No     Comment: none    Review of Systems  Unable to perform ROS: Acuity of condition      Allergies  Novocain  Home Medications   Prior to Admission medications   Medication Sig Start Date End Date Taking? Authorizing Provider  aspirin 81 MG tablet Take 1 tablet (81 mg total) by mouth daily. 05/13/12  Yes Larey Dresser, MD  carvedilol (COREG) 6.25 MG tablet TAKE ONE TABLET BY MOUTH TWICE DAILY WITH MEALS 01/11/15  Yes Larey Dresser, MD  glipiZIDE (GLUCOTROL) 5 MG tablet TAKE ONE TABLET BY MOUTH ONCE DAILY 03/09/15  Yes Janith Lima, MD  metFORMIN (GLUCOPHAGE) 1000 MG tablet Take 0.5 tablets (500 mg total) by mouth 2 (two) times daily with a meal. take half tablet twice daily with meal Patient taking differently: Take 500 mg by mouth  2 (two) times daily with a meal.  12/02/14  Yes Janith Lima, MD  ramipril (ALTACE) 2.5 MG capsule TAKE ONE CAPSULE BY MOUTH TWICE DAILY 09/15/14  Yes Larey Dresser, MD  rosuvastatin (CRESTOR) 40 MG tablet TAKE ONE TABLET BY MOUTH ONCE DAILY 03/09/15  Yes Larey Dresser, MD   BP 100/64 mmHg  Pulse 102  Temp(Src) 97.3 F (36.3 C) (Rectal)  Resp 26  Ht 5\' 8"  (1.727 m)  Wt 61.236 kg  BMI 20.53 kg/m2  SpO2 97% Physical Exam  Constitutional: He appears well-developed and well-nourished. He appears distressed.  Rapid respiratory rate with significant accessory muscle use  HENT:  Head: Normocephalic and atraumatic.  Nose: Nose normal.  Mouth/Throat: Oropharynx is clear and moist.  Eyes: Pupils are equal, round, and reactive to light.  Conjunctiva pale  Neck: Normal range of motion. Neck supple.  Cardiovascular: Normal rate, regular rhythm and normal heart sounds.   Pulmonary/Chest:   Increased work of breathing with diffuse rhonchi and some extra releases  Abdominal: Soft. Bowel sounds are normal. He exhibits no distension and no mass. There is no guarding.  Genitourinary: Guaiac negative stool.  Musculoskeletal: Normal range of motion. He exhibits no edema or tenderness.  Neurological: He is alert. He displays normal reflexes. No cranial nerve deficit. He exhibits normal muscle tone.  Patient is oriented to person place but not to date  Skin:  Wheeler note and vitals reviewed.   ED Course  Procedures (including critical care time) Labs Review Labs Reviewed  COMPREHENSIVE METABOLIC PANEL - Abnormal; Notable for the following:    CO2 19 (*)    Glucose, Bld 190 (*)    BUN 30 (*)    Creatinine, Ser 1.70 (*)    Calcium 7.7 (*)    Total Protein 5.8 (*)    Albumin 2.3 (*)    ALT 15 (*)    Alkaline Phosphatase 32 (*)    GFR calc non Af Amer 39 (*)    GFR calc Af Amer 45 (*)    All other components within normal limits  CBC WITH DIFFERENTIAL/PLATELET - Abnormal; Notable for the following:    WBC 3.0 (*)    RBC 1.38 (*)    Hemoglobin 4.3 (*)    HCT 13.9 (*)    MCV 100.7 (*)    RDW 17.3 (*)    Platelets 22 (*)    All other components within normal limits  I-STAT CG4 LACTIC ACID, ED - Abnormal; Notable for the following:    Lactic Acid, Venous 4.88 (*)    All other components within normal limits  I-STAT ARTERIAL BLOOD GAS, ED - Abnormal; Notable for the following:    pCO2 arterial 30.4 (*)    Bicarbonate 17.8 (*)    Acid-base deficit 6.0 (*)    All other components within normal limits  CULTURE, BLOOD (ROUTINE X 2)  CULTURE, BLOOD (ROUTINE X 2)  URINE CULTURE  URINALYSIS, ROUTINE W REFLEX MICROSCOPIC (NOT AT [facility])  BLOOD GAS, ARTERIAL  TYPE AND SCREEN  PREPARE RBC (CROSSMATCH)    Imaging Review Dg Chest Port 1 View  07/28/2015  CLINICAL DATA:  Dyspnea and cough for an unknown period of time. Neighbor reports the pt has been experiencing  nausea, anorexia, and weakness. Hx of DM, MI, AND COPD. Pt is a current smoker. EXAM: PORTABLE CHEST - 1 VIEW COMPARISON:  06/07/2014 FINDINGS: Patchy right perihilar airspace disease, new since previous. Left lung clear. Heart size normal.  Mildly tortuous atheromatous aorta. No effusion. No pneumothorax.Surgical fixation hardware overlies the lower thoracic spine, incompletely visualized distally. IMPRESSION: 1. Patchy right perihilar airspace disease, possibly infectious/inflammatory process. Consider follow-up to confirm appropriate resolution, exclude neoplasm. Electronically Signed   By: Lucrezia Europe M.D.   On: 07/07/2015 20:08   I have personally reviewed and evaluated these images and lab results as part of my medical decision-making.   EKG Interpretation   Date/Time:  Monday July 25 2015 19:50:15 EDT Ventricular Rate:  108 PR Interval:    QRS Duration: 92 QT Interval:  347 QTC Calculation: 466 R Axis:   93 Text Interpretation:  Sinus tachycardia Consider RVH w/ secondary repol  abnormality Repol abnrm, severe global ischemia (LM/MVD) Confirmed by Corrine Tillis  MD, Andee Poles [id number]) on 07/08/2015 9:35:38 PM      MDM   Final diagnoses:  Acute on chronic respiratory failure with hypoxia ([place])   1-altered mental status 2-sepsis-30 cc/kg bolus ordered- bp increasing with last bp 102/78 post intubation. 3-severe anemia-hemocult negative. Patient with history of aaa, but abdomen soft and nontender.  4 respiratory failure- patient with increasing dyspnea and hypoxia.  Patient intubated 5-copd exacerbation/pneumonia- patient given albuterol/solumedrol/ covered for cap per sepsis protocol.  6-AKI 7 dm- bs 160- no evidence of  dka   Discussed with Dr. Vaughan Browner  Sepsis - Repeat Assessment  Performed at:    10:08 PM   Vitals     Blood pressure 102/78, pulse 106, temperature 99.8 F (37.7 C), temperature source Oral, resp. rate 22, height 5\' 8"  (1.727 m), weight 61.236 kg, SpO2 99  %.  Heart:     Tachycardic  Lungs:    Wheezing  Capillary Refill:   > 2 sec  Peripheral Pulse:   Radial pulse palpable  Skin:     Mottled   CRITICAL CARE Performed by: Shaune Pollack Total critical care time: 90 minutes Critical care time was exclusive of separately billable procedures and treating other patients. Critical care was necessary to treat or prevent imminent or life-threatening deterioration. Critical care was time spent personally by me on the following activities: development of treatment plan with patient and/or surrogate as well as nursing, discussions with consultants, evaluation of patient's response to treatment, examination of patient, obtaining history from patient or surrogate, ordering and performing treatments and interventions, ordering and review of laboratory studies, ordering and review of radiographic studies, pulse oximetry and re-evaluation of patient's condition.   Pattricia Boss, MD 07/29/2015 [phone]

## 2015-07-25 NOTE — Progress Notes (Signed)
RT assisted ED physician with intubation. Bilateral breath sounds present. ETCO2 positive color change. CXR and ABG pending.

## 2015-07-25 NOTE — ED Notes (Signed)
Pt. intubated by EDP using #8 ETT taped at 22 cm. lip.

## 2015-07-25 NOTE — ED Provider Notes (Signed)
INTUBATION Performed by: Berenice Primas, MD  Authorized by: Pattricia Boss, MD  Required items: required blood products, implants, devices, and special equipment available Patient identity confirmed: provided demographic data and hospital-assigned identification number Time out: Immediately prior to procedure a "time out" was called to verify the correct patient, procedure, equipment, support staff and site/side marked as required.  Indications: Acute hypoxic respiratory failure, airway protection  Intubation method: Glidescope Laryngoscopy   Preoxygenation: NRB and nasal cannula  Sedatives: 10mg  Etomidate Paralytic: 120mg  Succinylcholine  Tube Size: 8.0 cuffed  Post-procedure assessment: chest rise and ETCO2 monitor Breath sounds: equal and absent over the epigastrium Tube secured with: ETT holder Chest x-ray interpreted by radiologist and me.  Chest x-ray findings: Endotracheal tube in appropriate position  Patient tolerated the procedure well with no immediate complications.     Berenice Primas, MD 07/10/2015 [id number]  Pattricia Boss, MD 07/20/2015 [phone]

## 2015-07-26 ENCOUNTER — Inpatient Hospital Stay (HOSPITAL_COMMUNITY): Payer: Commercial Managed Care - HMO

## 2015-07-26 DIAGNOSIS — I509 Heart failure, unspecified: Secondary | ICD-10-CM

## 2015-07-26 DIAGNOSIS — J9621 Acute and chronic respiratory failure with hypoxia: Secondary | ICD-10-CM

## 2015-07-26 DIAGNOSIS — G9340 Encephalopathy, unspecified: Secondary | ICD-10-CM | POA: Insufficient documentation

## 2015-07-26 DIAGNOSIS — J189 Pneumonia, unspecified organism: Secondary | ICD-10-CM | POA: Insufficient documentation

## 2015-07-26 DIAGNOSIS — R0600 Dyspnea, unspecified: Secondary | ICD-10-CM | POA: Insufficient documentation

## 2015-07-26 LAB — ECHOCARDIOGRAM COMPLETE
AOPV: 0.58 m/s
AV Area VTI index: 1 cm2/m2
AV Area VTI: 1.82 cm2
AV Mean grad: 5 mmHg
AV peak Index: 1.07
AV pk vel: 149 cm/s
AVAREAMEANV: 1.69 cm2
AVAREAMEANVIN: 1 cm2/m2
AVCELMEANRAT: 0.54
AVPG: 9 mmHg
CHL CUP AV VEL: 1.69
CHL CUP RV SYS PRESS: 38 mmHg
CHL CUP STROKE VOLUME: 27 mL
CHL CUP TV REG PEAK VELOCITY: 274 cm/s
DOP CAL AO MEAN VELOCITY: 108 cm/s
E decel time: 197 msec
E/e' ratio: 14.05
FS: 22 % — AB (ref 28–44)
HEIGHTINCHES: 68 in
IVS/LV PW RATIO, ED: 0.91
LA diam end sys: 38 mm
LA vol A4C: 42.7 ml
LA vol index: 28.5 mL/m2
LA vol: 48.2 mL
LADIAMINDEX: 2.24 cm/m2
LASIZE: 38 mm
LV E/e' medial: 14.05
LV E/e'average: 14.05
LV SIMPSON'S DISK: 46
LV TDI E'LATERAL: 7.83
LV sys vol: 32 mL (ref 21–61)
LVDIAVOL: 59 mL — AB (ref 62–150)
LVDIAVOLIN: 35 mL/m2
LVELAT: 7.83 cm/s
LVOT SV: 62 mL
LVOT VTI: 19.6 cm
LVOT area: 3.14 cm2
LVOT peak VTI: 0.54 cm
LVOT peak vel: 86.2 cm/s
LVOTD: 20 mm
LVSYSVOLIN: 19 mL/m2
MV Dec: 197
MV pk E vel: 110 m/s
MVAP: 2.68 cm2
MVPG: 5 mmHg
MVPKAVEL: 53.4 m/s
P 1/2 time: 82 ms
PW: 13.2 mm — AB (ref 0.6–1.1)
RV LATERAL S' VELOCITY: 6.53 cm/s
RV TAPSE: 16 mm
TDI e' medial: 7.29
TR max vel: 274 cm/s
VTI: 36.4 cm
Valve area index: 1
Valve area: 1.69 cm2
WEIGHTICAEL: 2123.47 [oz_av]

## 2015-07-26 LAB — CBC
HCT: 31.8 % — ABNORMAL LOW (ref 39.0–52.0)
HEMATOCRIT: 32.7 % — AB (ref 39.0–52.0)
HEMATOCRIT: 32.3 % — AB (ref 39.0–52.0)
HEMOGLOBIN: 10.5 g/dL — AB (ref 13.0–17.0)
HEMOGLOBIN: 10.7 g/dL — AB (ref 13.0–17.0)
Hemoglobin: 10.6 g/dL — ABNORMAL LOW (ref 13.0–17.0)
MCH: 28.8 pg (ref 26.0–34.0)
MCH: 29.4 pg (ref 26.0–34.0)
MCH: 29.2 pg (ref 26.0–34.0)
MCHC: 32.1 g/dL (ref 30.0–36.0)
MCHC: 33.3 g/dL (ref 30.0–36.0)
MCHC: 33.1 g/dL (ref 30.0–36.0)
MCV: 89.8 fL (ref 78.0–100.0)
MCV: 88.3 fL (ref 78.0–100.0)
MCV: 88 fL (ref 78.0–100.0)
PLATELETS: 18 10*3/uL — AB (ref 150–400)
Platelets: 16 10*3/uL — CL (ref 150–400)
Platelets: 18 10*3/uL — CL (ref 150–400)
RBC: 3.64 MIL/uL — ABNORMAL LOW (ref 4.22–5.81)
RBC: 3.6 MIL/uL — AB (ref 4.22–5.81)
RBC: 3.67 MIL/uL — ABNORMAL LOW (ref 4.22–5.81)
RDW: 18.7 % — AB (ref 11.5–15.5)
RDW: 19.4 % — AB (ref 11.5–15.5)
RDW: 19.8 % — ABNORMAL HIGH (ref 11.5–15.5)
WBC: 2.7 10*3/uL — ABNORMAL LOW (ref 4.0–10.5)
WBC: 2.8 10*3/uL — ABNORMAL LOW (ref 4.0–10.5)
WBC: 2.7 10*3/uL — ABNORMAL LOW (ref 4.0–10.5)

## 2015-07-26 LAB — GLUCOSE, CAPILLARY
GLUCOSE-CAPILLARY: 282 mg/dL — AB (ref 65–99)
GLUCOSE-CAPILLARY: 182 mg/dL — AB (ref 65–99)
Glucose-Capillary: 128 mg/dL — ABNORMAL HIGH (ref 65–99)
Glucose-Capillary: 250 mg/dL — ABNORMAL HIGH (ref 65–99)
Glucose-Capillary: 76 mg/dL (ref 65–99)
Glucose-Capillary: 82 mg/dL (ref 65–99)

## 2015-07-26 LAB — BLOOD GAS, ARTERIAL
ACID-BASE DEFICIT: 5.3 mmol/L — AB (ref 0.0–2.0)
Bicarbonate: 18.3 mEq/L — ABNORMAL LOW (ref 20.0–24.0)
DRAWN BY: 449561
FIO2: 0.6
MECHVT: 550 mL
O2 Saturation: 90.2 %
PEEP/CPAP: 5 cmH2O
PH ART: 7.42 (ref 7.350–7.450)
PO2 ART: 57.9 mmHg — AB (ref 80.0–100.0)
Patient temperature: 98.6
RATE: 26 resp/min
TCO2: 19.2 mmol/L (ref 0–100)
pCO2 arterial: 28.8 mmHg — ABNORMAL LOW (ref 35.0–45.0)

## 2015-07-26 LAB — PROTIME-INR
INR: 1.85 — ABNORMAL HIGH (ref 0.00–1.49)
Prothrombin Time: 21.2 seconds — ABNORMAL HIGH (ref 11.6–15.2)

## 2015-07-26 LAB — PHOSPHORUS
PHOSPHORUS: 3.5 mg/dL (ref 2.5–4.6)
PHOSPHORUS: 2.6 mg/dL (ref 2.5–4.6)
Phosphorus: 2.6 mg/dL (ref 2.5–4.6)

## 2015-07-26 LAB — RETICULOCYTES
RBC.: 3.57 MIL/uL — ABNORMAL LOW (ref 4.22–5.81)
RETIC CT PCT: 0.9 % (ref 0.4–3.1)
Retic Count, Absolute: 32.1 10*3/uL (ref 19.0–186.0)

## 2015-07-26 LAB — BASIC METABOLIC PANEL
Anion gap: 11 (ref 5–15)
BUN: 28 mg/dL — AB (ref 6–20)
CO2: 18 mmol/L — ABNORMAL LOW (ref 22–32)
CREATININE: 1.64 mg/dL — AB (ref 0.61–1.24)
Calcium: 6.9 mg/dL — ABNORMAL LOW (ref 8.9–10.3)
Chloride: 106 mmol/L (ref 101–111)
GFR calc Af Amer: 47 mL/min — ABNORMAL LOW (ref >60)
GFR, EST NON AFRICAN AMERICAN: 40 mL/min — AB (ref >60)
GLUCOSE: 273 mg/dL — AB (ref 65–99)
POTASSIUM: 3.8 mmol/L (ref 3.5–5.1)
SODIUM: 135 mmol/L (ref 135–145)

## 2015-07-26 LAB — LACTIC ACID, PLASMA: LACTIC ACID, VENOUS: 3.7 mmol/L — AB (ref 0.5–2.0)

## 2015-07-26 LAB — MAGNESIUM
MAGNESIUM: 1.5 mg/dL — AB (ref 1.7–2.4)
MAGNESIUM: 1.7 mg/dL (ref 1.7–2.4)
MAGNESIUM: 1.6 mg/dL — AB (ref 1.7–2.4)

## 2015-07-26 LAB — VITAMIN B12: Vitamin B-12: 998 pg/mL — ABNORMAL HIGH (ref 180–914)

## 2015-07-26 LAB — SAVE SMEAR

## 2015-07-26 LAB — MRSA PCR SCREENING: MRSA BY PCR: NEGATIVE

## 2015-07-26 LAB — PROCALCITONIN: PROCALCITONIN: 5.09 ng/mL

## 2015-07-26 LAB — PATHOLOGIST SMEAR REVIEW

## 2015-07-26 LAB — LACTATE DEHYDROGENASE: LDH: 1455 U/L — ABNORMAL HIGH (ref 98–192)

## 2015-07-26 LAB — BLOOD PRODUCT ORDER (VERBAL) VERIFICATION

## 2015-07-26 MED ORDER — VITAL HIGH PROTEIN PO LIQD: 1000.0000 mL | ORAL | Status: DC

## 2015-07-26 MED ORDER — DOXYCYCLINE HYCLATE 100 MG PO TABS
100.0000 mg | ORAL_TABLET | Freq: Two times a day (BID) | ORAL | Status: DC
Start: 2015-07-26 — End: 2015-07-26
  Filled 2015-07-26: qty 1

## 2015-07-26 MED ORDER — FAMOTIDINE IN NACL 20-0.9 MG/50ML-% IV SOLN
20.0000 mg | INTRAVENOUS | Status: DC
  Administered 2015-07-27 – 2015-08-02 (×7): 20 mg via INTRAVENOUS
  Filled 2015-07-26 (×7): qty 50

## 2015-07-26 MED ORDER — METHYLPREDNISOLONE SODIUM SUCC 125 MG IJ SOLR
60.0000 mg | INTRAMUSCULAR | Status: DC
  Administered 2015-07-27: 60 mg via INTRAVENOUS
  Filled 2015-07-26: qty 0.96

## 2015-07-26 MED ORDER — ANTISEPTIC ORAL RINSE SOLUTION (CORINZ)
7.0000 mL | Freq: Four times a day (QID) | OROMUCOSAL | Status: DC
  Administered 2015-07-26 (×3): 7 mL via OROMUCOSAL

## 2015-07-26 MED ORDER — MAGNESIUM SULFATE 2 GM/50ML IV SOLN
2.0000 g | Freq: Once | INTRAVENOUS | Status: AC
  Administered 2015-07-26: 2 g via INTRAVENOUS
  Filled 2015-07-26: qty 50

## 2015-07-26 MED ORDER — DOXYCYCLINE HYCLATE 100 MG PO TABS
100.0000 mg | ORAL_TABLET | Freq: Two times a day (BID) | ORAL | Status: DC
  Administered 2015-07-26 – 2015-07-30 (×7): 100 mg via ORAL
  Filled 2015-07-26 (×10): qty 1

## 2015-07-26 MED ORDER — INSULIN ASPART 100 UNIT/ML ~~LOC~~ SOLN
0.0000 [IU] | SUBCUTANEOUS | Status: DC
  Administered 2015-07-26: 4 [IU] via SUBCUTANEOUS
  Administered 2015-07-26: 11 [IU] via SUBCUTANEOUS
  Administered 2015-07-26 – 2015-07-27 (×2): 3 [IU] via SUBCUTANEOUS
  Administered 2015-07-27 – 2015-07-28 (×4): 4 [IU] via SUBCUTANEOUS
  Administered 2015-07-28: 11 [IU] via SUBCUTANEOUS
  Administered 2015-07-28: 3 [IU] via SUBCUTANEOUS
  Administered 2015-07-28 (×2): 4 [IU] via SUBCUTANEOUS
  Administered 2015-07-29: 3 [IU] via SUBCUTANEOUS
  Administered 2015-07-29: 11 [IU] via SUBCUTANEOUS
  Administered 2015-07-29: 15 [IU] via SUBCUTANEOUS
  Administered 2015-07-29: 11 [IU] via SUBCUTANEOUS
  Administered 2015-07-29: 20 [IU] via SUBCUTANEOUS
  Administered 2015-07-30: 3 [IU] via SUBCUTANEOUS
  Administered 2015-07-30 (×3): 4 [IU] via SUBCUTANEOUS
  Administered 2015-07-30: 3 [IU] via SUBCUTANEOUS
  Administered 2015-07-31 (×5): 4 [IU] via SUBCUTANEOUS
  Administered 2015-07-31: 11 [IU] via SUBCUTANEOUS
  Administered 2015-08-01: 7 [IU] via SUBCUTANEOUS
  Administered 2015-08-01 (×2): 4 [IU] via SUBCUTANEOUS
  Administered 2015-08-01: 7 [IU] via SUBCUTANEOUS
  Administered 2015-08-01 (×2): 4 [IU] via SUBCUTANEOUS
  Administered 2015-08-02 (×2): 7 [IU] via SUBCUTANEOUS
  Administered 2015-08-02: 3 [IU] via SUBCUTANEOUS
  Administered 2015-08-02: 11 [IU] via SUBCUTANEOUS

## 2015-07-26 MED ORDER — DEXTROSE 5 % IV SOLN: 1.0000 g | INTRAVENOUS | Status: DC

## 2015-07-26 MED ORDER — CHLORHEXIDINE GLUCONATE 0.12% ORAL RINSE (MEDLINE KIT)
15.0000 mL | Freq: Two times a day (BID) | OROMUCOSAL | Status: DC
  Administered 2015-07-26 (×2): 15 mL via OROMUCOSAL

## 2015-07-26 MED ORDER — PRO-STAT SUGAR FREE PO LIQD
30.0000 mL | Freq: Two times a day (BID) | ORAL | Status: DC
  Filled 2015-07-26: qty 30

## 2015-07-26 MED ORDER — DEXTROSE 5 % IV SOLN: 500.0000 mg | INTRAVENOUS | Status: DC

## 2015-07-26 NOTE — Care Management Note (Addendum)
Case Management Note  Patient Details  Name: Arthur Evans. MRN: [id number] Date of Birth: July 18, 1945  Subjective/Objective:  Pt admitted with anemia, fatigue and altered mental status  - intubated                Action/Plan:   Pt is independent from home alone.  Son at bedside.  CM will continue to follow for discharge needs   Expected Discharge Date:                  Expected Discharge Plan:  [place]  In-House Referral:     Discharge planning Services  CM Consult  Post Acute Care Choice:    Choice offered to:     DME Arranged:    DME Agency:     HH Arranged:    HH Agency:     Status of Service:  In process, will continue to follow  Medicare Important Message Given:    Date Medicare IM Given:    Medicare IM give by:    Date Additional Medicare IM Given:    Additional Medicare Important Message give by:     If discussed at [place] of Stay Meetings, dates discussed:    Additional Comments:  Maryclare Labrador, RN 07/26/2015, 10:29 AM

## 2015-07-26 NOTE — Progress Notes (Signed)
Nutrition Consult  Received nutrition consult to start TF. Patient was just extubated per discussion with RN and does not need TF. Please re-consult RD if nutrition concerns arise.  Molli Barrows, RD, LDN, [place] Pager [phone] After Hours Pager [phone]

## 2015-07-26 NOTE — Progress Notes (Signed)
Dr. Halford Chessman made aware of bilateral PNA CT result, and platelets trending down.  No new orders received at this time.

## 2015-07-26 NOTE — Progress Notes (Signed)
PULMONARY / CRITICAL CARE MEDICINE   Name: Arthur Evans. MRN: [id number] DOB: 07-Apr-1945    ADMISSION DATE:  07/08/2015 CONSULTATION DATE:  6/19  REFERRING MD:  Dr. Jeanell Sparrow EDP  CHIEF COMPLAINT:  AMS  HISTORY OF PRESENT ILLNESS:   70 year old male with PMH as below, which includes AAA, DM on metformin, MI, DVT, and seizure disorder (thought due to hypoglycemia.  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 He presented to [facility] ED 6/19 with complaints of generalized weakness for 4 days. In ED he was found to be confused, febrile to 102.3 and hypotensive. He denied and symptoms of GI bleed, but hemoglobin was found to be 4.3. He required intubation for airway protection. He was transfused 2 units of emergent release blood and PCCM was called for admission.    SUBJECTIVE: Weaning on vent > since 0900. Opens eyes, follows commands. Denies pain.  No bleeding, no diarrhea, no fever   VITAL SIGNS: BP 127/68 mmHg  Pulse 69  Temp(Src) 98.1 F (36.7 C) (Oral)  Resp 11  Ht 5\' 8"  (1.727 m)  Wt 132 lb 11.5 oz (60.2 kg)  BMI 20.18 kg/m2  SpO2 98%  HEMODYNAMICS:    VENTILATOR SETTINGS: Vent Mode:  [-] CPAP;PSV FiO2 (%):  [50 %-60 %] 50 % Set Rate:  [20 bmp-26 bmp] 26 bmp Vt Set:  [550 mL] 550 mL PEEP:  [5 cmH20] 5 cmH20 Pressure Support:  [8 cmH20] 8 cmH20 Plateau Pressure:  [23 cmH20-30 cmH20] 23 cmH20  INTAKE / OUTPUT: I/O last 3 completed shifts: In: 6836.3 [I.V.:6836.3] Out: 450 [Urine:400; Emesis/NG output:50]  PHYSICAL EXAMINATION: General: elderly male in NAD on vent Neuro: RASS 0, on vent, follows commands, MAE HEENT: [place]/AT, PERRL 85mm, pale conjunctiva Cardiovascular: RRR, no MRG, +2 pulses, no edema Lungs: non-labored, clear, able to pull 800Vt + on 0/0 Abdomen: Soft, non-distended, BS+ Musculoskeletal: No acute deformity  or ROM limitation Skin: Grossly intact, skin warm and dry.   LABS:  BMET  Recent Labs Lab 07/13/2015 2018 07/26/15 0217  NA 135 135  K 4.0 3.8  CL 106 106  CO2 19* 18*  BUN 30* 28*  CREATININE 1.70* 1.64*  GLUCOSE 190* 273*    Electrolytes  Recent Labs Lab 08/03/2015 2018 07/26/15 0217  CALCIUM 7.7* 6.9*  MG  --  1.5*  PHOS  --  3.5    CBC  Recent Labs Lab 07/26/15 0217 07/26/15 0832 07/26/15 1211  WBC 2.7* 2.8* 2.7*  HGB 10.5* 10.6* 10.7*  HCT 32.7* 31.8* 32.3*  PLT 16* 18* 18*    Coag's  Recent Labs Lab 07/26/15 0217  INR 1.85*    Sepsis Markers  Recent Labs Lab 08/03/2015 2032 07/08/2015 2231 07/26/15 0217  LATICACIDVEN 4.88* 6.62* 3.7*    ABG  Recent Labs Lab 07/14/2015 2010 07/28/2015 2228 07/26/15 0432  PHART 7.383 7.169* 7.420  PCO2ART 30.4* 36.9 28.8*  PO2ART 89.0 129.0* 57.9*    Liver Enzymes  Recent Labs Lab 07/14/2015 2018  AST 24  ALT 15*  ALKPHOS 32*  BILITOT 1.0  ALBUMIN 2.3*    Cardiac Enzymes No results for input(s): TROPONINI, PROBNP in the last 168 hours.  Glucose  Recent Labs Lab 07/26/15 0002 07/26/15 0333 07/26/15 0827 07/26/15 1136  GLUCAP 250* 282* 182* 128*    Imaging Ct Abdomen Pelvis Wo  Contrast  07/26/2015  CLINICAL DATA:  Follow-up abdominal aortic aneurysm. Initial encounter. EXAM: CT ABDOMEN AND PELVIS WITHOUT CONTRAST TECHNIQUE: Multidetector CT imaging of the abdomen and pelvis was performed following the standard protocol without IV contrast. COMPARISON:  CT of the abdomen and pelvis from 10/27/2013 FINDINGS: Patchy bibasilar airspace opacities are noted, most prominent at the right middle lobe, compatible with multifocal pneumonia. Diffuse coronary artery calcifications are seen. A small amount of free fluid is tracking about the inferior tip of the liver, and within the pelvis. Trace pericholecystic fluid is nonspecific in the presence of ascites. The gallbladder is otherwise grossly  unremarkable. The liver and spleen are unremarkable in appearance. The pancreas and adrenal glands are unremarkable. Scattered vascular calcifications are seen at the renal hila bilaterally. There is no evidence of hydronephrosis. No renal or ureteral stones are seen. No perinephric stranding is appreciated. No free fluid is otherwise seen. The small bowel is unremarkable in appearance. The patient's enteric tube is seen ending at the antrum of the stomach. The stomach is within normal limits. No acute vascular abnormalities are seen. There is aneurysmal dilatation of the infrarenal abdominal aorta to 4.0 cm in AP and transverse dimensions, nearly unchanged from 2015. Aneurysmal dilatation resolves at the aortic bifurcation. Scattered calcification is noted along the superior mesenteric artery. Scattered calcification is noted along the abdominal aorta and its branches. The appendix is normal in caliber, without evidence of appendicitis. The colon is largely decompressed and grossly unremarkable. Vague soft tissue edema is noted within the upper pelvis, tracking about small bowel loops. The bladder is relatively decompressed. A small amount of air is noted within the bladder, with a Foley catheter in place. Apparent bladder wall thickening is thought to reflect relative decompression. The prostate is enlarged, measuring 5.8 cm in transverse dimension. No inguinal lymphadenopathy is seen. No acute osseous abnormalities are identified. Facet disease is noted at the lower lumbar spine. Thoracolumbar spinal fusion hardware is partially imaged. IMPRESSION: 1. Patchy bibasilar airspace opacities, most prominent at the right middle lobe, compatible with multifocal pneumonia. 2. Small amount of ascites within the abdomen and pelvis. 3. Aneurysmal dilatation of the infrarenal abdominal aorta to 4.0 cm in AP and transverse dimension, nearly unchanged from 2015. Scattered calcification along the abdominal aorta and its  branches, including along the superior mesenteric artery. Aneurysmal dilatation resolves at the aortic bifurcation. Recommend followup by ultrasound in 1 year. This recommendation follows ACR consensus guidelines: White Paper of the ACR Incidental Findings Committee II on Vascular Findings. J Am Coll Radiol 2013; 10:789-794. 4. Vague soft tissue edema within the upper pelvis, tracking about small bowel loops. This may reflect the patient's underlying volume status, or possibly a mild infectious or inflammatory process involving the small bowel. Would correlate with the patient's symptoms. 5. Diffuse coronary artery calcifications seen. 6. Enlarged prostate noted. These results were called by telephone at the time of interpretation on 07/26/2015 at 12:07 am to [facility] on [facility], who verbally acknowledged these results. Electronically Signed   By: Garald Balding M.D.   On: 07/26/2015 00:11   Dg Chest Port 1 View  07/26/2015  CLINICAL DATA:  Right lung infiltrate.  Shortness of breath . EXAM: PORTABLE CHEST 1 VIEW COMPARISON:  08/02/2015. FINDINGS: Endotracheal tube and NG tube in stable position. New onset of right lower lobe infiltrate consistent pneumonia. No pleural effusion or pneumothorax. Prior thoracolumbar spine fusion . IMPRESSION: 1. Lines and tubes in stable position. 2.  New onset right lower  lobe infiltrate consistent pneumonia. Electronically Signed   By: Marcello Moores  Register   On: 07/26/2015 06:46   Dg Chest Portable 1 View  07/20/2015  CLINICAL DATA:  Endotracheal tube and orogastric tube placement. Generalized weakness and nausea. Confusion. Initial encounter. EXAM: PORTABLE CHEST 1 VIEW COMPARISON:  Chest radiograph performed earlier today at 7:56 p.m. FINDINGS: The patient's endotracheal tube is seen ending 3-4 cm above the carina. An enteric tube is noted extending below the diaphragm. Vascular congestion is noted, somewhat more prominent than on the recent prior study, with perihilar opacity,  which may reflect pneumonia or pulmonary edema. No pleural effusion or pneumothorax is seen The cardiomediastinal silhouette remains normal in size. No acute osseous abnormalities are identified. Thoracolumbar spinal fusion hardware is partially imaged. IMPRESSION: 1. Endotracheal tube seen ending 3-4 cm above the carina. 2. Enteric tube noted extending below the diaphragm. 3. Vascular congestion noted, somewhat more prominent than on the recent prior study, with perihilar opacity, which may reflect pneumonia or pulmonary edema. Electronically Signed   By: Garald Balding M.D.   On: 07/30/2015 21:57   Dg Chest Port 1 View  07/14/2015  CLINICAL DATA:  Dyspnea and cough for an unknown period of time. Neighbor reports the pt has been experiencing nausea, anorexia, and weakness. Hx of DM, MI, AND COPD. Pt is a current smoker. EXAM: PORTABLE CHEST - 1 VIEW COMPARISON:  06/07/2014 FINDINGS: Patchy right perihilar airspace disease, new since previous. Left lung clear. Heart size normal.  Mildly tortuous atheromatous aorta. No effusion. No pneumothorax.Surgical fixation hardware overlies the lower thoracic spine, incompletely visualized distally. IMPRESSION: 1. Patchy right perihilar airspace disease, possibly infectious/inflammatory process. Consider follow-up to confirm appropriate resolution, exclude neoplasm. Electronically Signed   By: Lucrezia Europe M.D.   On: 08/03/2015 20:08    STUDIES:  CT abd 6/19 >> patchy bibasilar airspace opacities, most prominent in RML, small amt ascites within the abd / pelvis, aneurysmal dilation of the infrarenal abdominal aorta (unchanged from 2015), vague soft tissue edema within the upper pelvis, tracking about the small bowel loops, diffuse CAD, enlarged prostate  CULTURES: BCx2 6/19 > Urine 6/19 >  ANTIBIOTICS: Ceftriaxone 6/19 > Azithromycin 6/19 >  SIGNIFICANT EVENTS: 6/19 admit with fever, ams, pancytopenia.  Transfused, intubated  LINES/TUBES: ETT 6/19  >>  DISCUSSION: 70 year old male presented with AMS requiring intubation. Found to have pancytopenia requiring 4 units PRBC. Remained hypotensive. Concern septic shock. Stabilize on vent with CAP coverage.  Serial CBC and transfusion as needed. Nebs and steroids for COPD exacerbation. CT abd pending.   ASSESSMENT / PLAN:  PULMONARY A: Inability to protect airway in setting AMS COPD with acute exacerbation Tobacco Abuse - smoked since age 67, heaviest up to 1.5 ppd, now 5-6 cigs per day Possible CAP vs aspiration - appears rounded on CXR, more infiltrative on CT abd/pelvis L lung nodule from prior CT abdomen  P:   Now extubation  Pulmonary hygiene - IS, mobilize CXR in AM Scheduled Duonebs, PRN albuterol Solumedrol 60mg  q24  Consider non-contrast CT chest to evaluate CT chest  CARDIOVASCULAR A:  Shock - resolved. Suspect septic vs hypovolmia/hemmorhagic as well. S/P 3 units PRBC's, 5L NS Chronic diastolic CHF (Grade 1 DD) AAA 3.88x3.81 CM - unchanged from 2015 H/o CAD  P:  Telemetry monitoring MAP goal > 91mmHg Await ECHO  Trend lactic, troponin  RENAL A:   AKI Pseudohypocalcemia (corrects to 9.1) AGMA - lactic, clearing   P:   Follow BMP Correct electrolytes as  indicated NS to [facility] Mg 2gm IV   GASTROINTESTINAL A:   Ascites - small amt noted on CT, ? Inflammatory vs edema Protein Calorie Malnutrition  P:   NPO Pepcid for SUP Begin TF Assess acute hepatitis panel, LDH  HEMATOLOGIC Folic Acid >>  123456 >> [id number] Reticulocyte >> 0.9 Peripheral Smear >>  RMSF >> A:   Severe pancytopenia - etiology unclear, could be due to profound infection, has had tick bites in recent week (no rash on exam), concern for malignancy with lab disturbances (decreased appetite, mild weight loss).  S/P 4 units PRBC 6/19  P:  PT/INR Follow up folic acic, peripheral smear. Consider hematology consult if not improving.  Trend CBC  INFECTIOUS A:   SIRS/Sepsis ? CAP vs  aspiration  P:   ABX as above Trend PCT Monitor WBC and fever curves  ENDOCRINE A:   DM  P:   Hold metformin, glipizide CBG monitoring and SSI  NEUROLOGIC A:   Acute metabolic encephalopathy - resolved  P:   Discontinue sedation with extubation Minimize sedating medications   FAMILY  - Updates: Family updated at bedside 6/20  - Inter-disciplinary family meet or Palliative Care meeting due by:  6/19    Noe Gens, NP-C [place] Pgr: [phone] or if no answer [phone] 07/26/2015, 1:45 PM  Attending Note:  70 year old previously healthy man presenting to the hospital with AMS. Was found to be pancytopenic and was intubated for airway protection and renal failure. With transfusion and hydration patient improved significantly. On exam, patient is alert and interactive, moving all ext to command and weaning. I reviewed abdominal CT myself bowel inflammation noted. Travel history with tick borne illnesses as a concern. Will add doxy, send legionella antigen (zithromax and doxy covers) and monitor blood counts. Concern for aplastic anemia still there. Will extubate and monitor.  The patient is critically ill with multiple organ systems failure and requires high complexity decision making for assessment and support, frequent evaluation and titration of therapies, application of advanced monitoring technologies and extensive interpretation of multiple databases.   Critical Care Time devoted to patient care services described in this note is 35 Minutes. This time reflects time of care of this signee Dr [name]. This critical care time does not reflect procedure time, or teaching time or supervisory time of PA/NP/Med student/Med Resident etc but could involve care discussion time.  Rush Farmer, M.D. [facility] Pulmonary/Critical Care Medicine. Pager: [phone]. After hours pager: [phone].

## 2015-07-26 NOTE — Progress Notes (Signed)
Inpatient Diabetes Program Recommendations  AACE/ADA: New Consensus Statement on Inpatient Glycemic Control (2015)  Target Ranges:  Prepandial:   less than 140 mg/dL      Peak postprandial:   less than 180 mg/dL (1-2 hours)      Critically ill patients:  140 - 180 mg/dL   Lab Results  Component Value Date   GLUCAP 182* 07/26/2015   HGBA1C 7.4* 12/02/2014    Review of Glycemic Control  Diabetes history: yes T-2 Outpatient Diabetes medications: Metformin, Glipizide Current orders for Inpatient glycemic control: Novolog q4  Inpatient Diabetes Program Recommendations:   Consider using the ICU Glycemic Control order-set for optimal glucose control. Thank you  Raoul Pitch MSN, RN,CDE Inpatient Diabetes Coordinator [phone] (team pager)

## 2015-07-26 NOTE — Procedures (Signed)
Extubation Procedure Note  Patient Details:   Name: Arthur Evans. DOB: Sep 26, 1945 MRN: [id number]   Airway Documentation:     Evaluation  O2 sats: stable throughout Complications: No apparent complications Patient did tolerate procedure well. Bilateral Breath Sounds: Rhonchi   Yes  Pt extubated to 4 L/M O2 via humidified [place]. No stridor noted. No increased WOB. Pt tolerated extubation well. SpO2 96  [name] 07/26/2015, 2:55 PM

## 2015-07-26 NOTE — Progress Notes (Signed)
Wasted 100cc fentanyl in sink with Neysa Hotter RN

## 2015-07-26 NOTE — Progress Notes (Signed)
*  PRELIMINARY RESULTS* Echocardiogram 2D Echocardiogram has been performed.  Bobbye Charleston 07/26/2015, 1:47 PM

## 2015-07-27 ENCOUNTER — Other Ambulatory Visit: Payer: Self-pay | Admitting: Hematology and Oncology

## 2015-07-27 ENCOUNTER — Inpatient Hospital Stay (HOSPITAL_COMMUNITY): Payer: Commercial Managed Care - HMO

## 2015-07-27 DIAGNOSIS — R042 Hemoptysis: Secondary | ICD-10-CM | POA: Insufficient documentation

## 2015-07-27 LAB — COMPREHENSIVE METABOLIC PANEL
ALT: 442 U/L — ABNORMAL HIGH (ref 17–63)
ANION GAP: 12 (ref 5–15)
Albumin: 2.3 g/dL — ABNORMAL LOW (ref 3.5–5.0)
Alkaline Phosphatase: 52 U/L (ref 38–126)
BUN: 29 mg/dL — AB (ref 6–20)
CHLORIDE: 106 mmol/L (ref 101–111)
CO2: 21 mmol/L — ABNORMAL LOW (ref 22–32)
Calcium: 7.8 mg/dL — ABNORMAL LOW (ref 8.9–10.3)
Creatinine, Ser: 1.65 mg/dL — ABNORMAL HIGH (ref 0.61–1.24)
GFR calc Af Amer: 47 mL/min — ABNORMAL LOW (ref >60)
GFR, EST NON AFRICAN AMERICAN: 40 mL/min — AB (ref >60)
Glucose, Bld: 127 mg/dL — ABNORMAL HIGH (ref 65–99)
POTASSIUM: 3.4 mmol/L — AB (ref 3.5–5.1)
Sodium: 139 mmol/L (ref 135–145)
TOTAL PROTEIN: 5.8 g/dL — AB (ref 6.5–8.1)
Total Bilirubin: 0.9 mg/dL (ref 0.3–1.2)

## 2015-07-27 LAB — CBC
HEMATOCRIT: 31.9 % — AB (ref 39.0–52.0)
HEMATOCRIT: 37.4 % — AB (ref 39.0–52.0)
HEMOGLOBIN: 10.8 g/dL — AB (ref 13.0–17.0)
HEMOGLOBIN: 12.5 g/dL — AB (ref 13.0–17.0)
MCH: 29 pg (ref 26.0–34.0)
MCH: 29.6 pg (ref 26.0–34.0)
MCHC: 33.9 g/dL (ref 30.0–36.0)
MCHC: 33.4 g/dL (ref 30.0–36.0)
MCV: 85.8 fL (ref 78.0–100.0)
MCV: 88.6 fL (ref 78.0–100.0)
Platelets: 16 10*3/uL — CL (ref 150–400)
Platelets: 22 10*3/uL — CL (ref 150–400)
RBC: 3.72 MIL/uL — ABNORMAL LOW (ref 4.22–5.81)
RBC: 4.22 MIL/uL (ref 4.22–5.81)
RDW: 20.5 % — AB (ref 11.5–15.5)
RDW: 20.8 % — AB (ref 11.5–15.5)
WBC: 3.2 10*3/uL — ABNORMAL LOW (ref 4.0–10.5)
WBC: 2.2 10*3/uL — AB (ref 4.0–10.5)

## 2015-07-27 LAB — GLUCOSE, CAPILLARY
GLUCOSE-CAPILLARY: 112 mg/dL — AB (ref 65–99)
GLUCOSE-CAPILLARY: 127 mg/dL — AB (ref 65–99)
GLUCOSE-CAPILLARY: 90 mg/dL (ref 65–99)
Glucose-Capillary: 167 mg/dL — ABNORMAL HIGH (ref 65–99)
Glucose-Capillary: 171 mg/dL — ABNORMAL HIGH (ref 65–99)
Glucose-Capillary: 92 mg/dL (ref 65–99)

## 2015-07-27 LAB — URINE CULTURE: CULTURE: NO GROWTH

## 2015-07-27 LAB — LIPASE, BLOOD: Lipase: 12 U/L (ref 11–51)

## 2015-07-27 LAB — HIV ANTIBODY (ROUTINE TESTING W REFLEX): HIV Screen 4th Generation wRfx: NONREACTIVE

## 2015-07-27 LAB — FOLATE RBC
FOLATE, RBC: 1164 ng/mL (ref >498)
Folate, Hemolysate: 363.3 ng/mL
Hematocrit: 31.2 % — ABNORMAL LOW (ref 37.5–51.0)

## 2015-07-27 LAB — LEGIONELLA PNEUMOPHILA SEROGP 1 UR AG: L. PNEUMOPHILA SEROGP 1 UR AG: NEGATIVE

## 2015-07-27 LAB — PROCALCITONIN: Procalcitonin: 4.33 ng/mL

## 2015-07-27 LAB — TSH: TSH: 0.607 u[IU]/mL (ref 0.350–4.500)

## 2015-07-27 LAB — HEPATITIS PANEL, ACUTE
HCV Ab: <0.1 s/co ratio (ref 0.0–0.9)
HEP B C IGM: NEGATIVE
Hep A IgM: NEGATIVE
Hepatitis B Surface Ag: NEGATIVE

## 2015-07-27 LAB — AMYLASE: AMYLASE: 28 U/L (ref 28–100)

## 2015-07-27 LAB — PHOSPHORUS: PHOSPHORUS: 2.6 mg/dL (ref 2.5–4.6)

## 2015-07-27 LAB — MAGNESIUM: Magnesium: 2.1 mg/dL (ref 1.7–2.4)

## 2015-07-27 LAB — TROPONIN I: TROPONIN I: 0.23 ng/mL — AB (ref <0.031)

## 2015-07-27 MED ORDER — FUROSEMIDE 10 MG/ML IJ SOLN
40.0000 mg | Freq: Once | INTRAMUSCULAR | Status: AC
  Administered 2015-07-27: 40 mg via INTRAVENOUS
  Filled 2015-07-27: qty 4

## 2015-07-27 MED ORDER — CHLORHEXIDINE GLUCONATE 0.12% ORAL RINSE (MEDLINE KIT)
15.0000 mL | Freq: Two times a day (BID) | OROMUCOSAL | Status: DC
  Administered 2015-07-27 – 2015-07-30 (×8): 15 mL via OROMUCOSAL

## 2015-07-27 MED ORDER — SODIUM CHLORIDE 0.9 % IV SOLN
0.0000 ug/h | INTRAVENOUS | Status: DC
  Administered 2015-07-27: 5 ug/h via INTRAVENOUS
  Administered 2015-07-28: 150 ug/h via INTRAVENOUS
  Filled 2015-07-27 (×4): qty 50

## 2015-07-27 MED ORDER — FENTANYL CITRATE (PF) 100 MCG/2ML IJ SOLN
INTRAMUSCULAR | Status: AC
  Administered 2015-07-27: 100 ug
  Filled 2015-07-27: qty 2

## 2015-07-27 MED ORDER — MIDAZOLAM HCL 2 MG/2ML IJ SOLN
INTRAMUSCULAR | Status: AC
  Administered 2015-07-27: 2 mg
  Filled 2015-07-27: qty 2

## 2015-07-27 MED ORDER — METHYLPREDNISOLONE SODIUM SUCC 125 MG IJ SOLR
60.0000 mg | Freq: Two times a day (BID) | INTRAMUSCULAR | Status: DC
  Administered 2015-07-27 – 2015-07-31 (×9): 60 mg via INTRAVENOUS
  Filled 2015-07-27 (×10): qty 0.96

## 2015-07-27 MED ORDER — ANTISEPTIC ORAL RINSE SOLUTION (CORINZ)
7.0000 mL | OROMUCOSAL | Status: DC
  Administered 2015-07-27 – 2015-07-29 (×13): 7 mL via OROMUCOSAL

## 2015-07-27 MED ORDER — ETOMIDATE 2 MG/ML IV SOLN
0.3000 mg/kg | Freq: Once | INTRAVENOUS | Status: AC
  Administered 2015-07-27: 20 mg via INTRAVENOUS

## 2015-07-27 MED ORDER — MIDAZOLAM HCL 2 MG/2ML IJ SOLN
2.0000 mg | INTRAMUSCULAR | Status: DC | PRN
  Administered 2015-07-27 – 2015-07-29 (×4): 2 mg via INTRAVENOUS
  Filled 2015-07-27 (×4): qty 2

## 2015-07-27 MED ORDER — ROCURONIUM BROMIDE 50 MG/5ML IV SOLN
1.0000 mg/kg | Freq: Once | INTRAVENOUS | Status: AC
  Administered 2015-07-27: 50 mg via INTRAVENOUS
  Filled 2015-07-27: qty 6.11

## 2015-07-27 MED ORDER — WHITE PETROLATUM GEL
Status: AC
  Administered 2015-07-27: 14:00:00
  Filled 2015-07-27: qty 1

## 2015-07-27 NOTE — Progress Notes (Signed)
Patient began hemoptysizing and removed mask.  Desaturated to 62% off BiPAP.  Finally agreed to be intubated and bronchoscoped, signed consent himself.  I notified son over the phone and after the procedures the daughter and other family members arrived and were updated personally.  Concern here is a cancerous process (lung).  Will send sample for culture and for cytology.  The patient is critically ill with multiple organ systems failure and requires high complexity decision making for assessment and support, frequent evaluation and titration of therapies, application of advanced monitoring technologies and extensive interpretation of multiple databases.   Critical Care Time devoted to patient care services described in this note is  45  Minutes. This time reflects time of care of this signee Dr [name]. This critical care time does not reflect procedure time, or teaching time or supervisory time of PA/NP/Med student/Med Resident etc but could involve care discussion time.  Rush Farmer, M.D. [facility] Pulmonary/Critical Care Medicine. Pager: [phone]. After hours pager: [phone].

## 2015-07-27 NOTE — Progress Notes (Signed)
[facility] Progress Note Patient Name: Arthur Evans. DOB: [birth year] MRN: [id number]   Date of Service  07/27/2015  HPI/Events of Note  [place] and low oxygen level.  eICU Interventions  Lasix 40 mg IV x one.     Intervention Category Major Interventions: Other:  Kreston Ahrendt 07/27/2015, 6:22 AM

## 2015-07-27 NOTE — Progress Notes (Signed)
[place] Progress Note Patient Name: Arthur Evans. DOB: 07-Jan-1946 MRN: [id number]   Date of Service  07/27/2015  HPI/Events of Note  Patient reintubated today. Needs NGT or OGT for PO meds.   eICU Interventions  Will order NGT/OGT placed.      Intervention Category Minor Interventions: Routine modifications to care plan (e.g. PRN medications for pain, fever)  Arthur Evans 07/27/2015, 9:51 PM

## 2015-07-27 NOTE — Progress Notes (Signed)
Upon initial assessment, patient noted to be dyspneic with low O2 saturations (80's), and using accessory muscles. Pt placed on a NRB and Sallye Ober made aware. Orders for bipap received. Will monitor  Lorre Munroe

## 2015-07-27 NOTE — Care Management Important Message (Signed)
Important Message  Patient Details  Name: Arthur Evans. MRN: [id number] Date of Birth: August 16, 1945   Medicare Important Message Given:  Yes    Danny Lawless Leroy Sea 07/27/2015, 1:40 PM

## 2015-07-27 NOTE — Procedures (Signed)
Bronchoscopy Procedure Note Arthur Evans [id number] [birth year]  Procedure: Bronchoscopy Indications: Diagnostic evaluation of the airways and Obtain specimens for culture and/or other diagnostic studies  Procedure Details Consent: Risks of procedure as well as the alternatives and risks of each were explained to the (patient/caregiver).  Consent for procedure obtained. Time Out: Verified patient identification, verified procedure, site/side was marked, verified correct patient position, special equipment/implants available, medications/allergies/relevent history reviewed, required imaging and test results available.  Performed  In preparation for procedure, patient was given 100% FiO2 and bronchoscope lubricated. Sedation: Benzodiazepines, Muscle relaxants and Fentanyl  Airway entered and the following bronchi were examined: RUL, RML, RLL, LUL, LLL and Bronchi.   No signs of bleeding noted in the airway but the BAL was bloody, second [place] cleared up however, not likely DAH, more likely older blood that settled in the alveolar space. Bronchoscope removed.    Evaluation Hemodynamic Status: BP stable throughout; O2 sats: stable throughout Patient's Current Condition: stable Specimens:  Sent serosanguinous fluid Complications: No apparent complications Patient did tolerate procedure well.   [name] 07/27/2015

## 2015-07-27 NOTE — Progress Notes (Signed)
PULMONARY / CRITICAL CARE MEDICINE   Name: Arthur Evans. MRN: [id number] DOB: 1945/10/07    ADMISSION DATE:  07/30/2015 CONSULTATION DATE:  6/19  REFERRING MD:  Dr. Jeanell Sparrow EDP  CHIEF COMPLAINT:  AMS  HISTORY OF PRESENT ILLNESS:  70 year old male with PMH as below, which includes AAA, DM on metformin, MI, DVT, and seizure disorder (thought due to hypoglycemia.  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 He presented to [facility] ED 6/19 with complaints of generalized weakness for 4 days. In ED he was found to be confused, febrile to 102.3 and hypotensive. He denied and symptoms of GI bleed, but hemoglobin was found to be 4.3. He required intubation for airway protection. He was transfused 2 units of emergent release blood and PCCM was called for admission.    SUBJECTIVE:  RN reports increased work of breathing, requiring 100%NRB, wheezing.  Lasix given overnight by Warren Lacy MD   VITAL SIGNS: BP 144/71 mmHg  Pulse 118  Temp(Src) 100.8 F (38.2 C) (Oral)  Resp 30  Ht 5\' 8"  (1.727 m)  Wt 134 lb 11.2 oz (61.1 kg)  BMI 20.49 kg/m2  SpO2 93%  HEMODYNAMICS:    VENTILATOR SETTINGS: Vent Mode:  [-] BIPAP FiO2 (%):  [32 %-55 %] 55 % Set Rate:  [12 bmp] 12 bmp PEEP:  [5 cmH20] 5 cmH20 Pressure Support:  [8 cmH20] 8 cmH20  INTAKE / OUTPUT: I/O last 3 completed shifts: In: 7185.1 [P.O.:150; I.V.:6935.1; IV Piggyback:100] Out: 27 [Urine:859; Emesis/NG output:50]  PHYSICAL EXAMINATION: General: elderly male in NAD on NRB Neuro: RASS 0, follows commands, MAE, speech clear, oriented / appropriate HEENT: [place]/AT, PERRL 68mm, pale conjunctiva Cardiovascular: RRR, no MRG, +2 pulses, no edema Lungs: mildly labored, lungs bilaterally with wheezing, coarse Abdomen: Soft, non-distended, BS+ Musculoskeletal: No acute deformity or ROM limitation Skin:  Grossly intact, skin warm and dry.   LABS:  BMET  Recent Labs Lab 07/07/2015 2018 07/26/15 0217  NA 135 135  K 4.0 3.8  CL 106 106  CO2 19* 18*  BUN 30* 28*  CREATININE 1.70* 1.64*  GLUCOSE 190* 273*    Electrolytes  Recent Labs Lab 07/20/2015 2018  07/26/15 0217 07/26/15 1408 07/26/15 1508 07/27/15 0436  CALCIUM 7.7*  --  6.9*  --   --   --   MG  --   < > 1.5* 1.7 1.6* 2.1  PHOS  --   < > 3.5 2.6 2.6 2.6  < > = values in this interval not displayed.  CBC  Recent Labs Lab 07/26/15 0832 07/26/15 1211 07/27/15 0436  WBC 2.8* 2.7* 3.2*  HGB 10.6* 10.7* 10.8*  HCT 31.8* 32.3* 31.9*  PLT 18* 18* 16*    Coag's  Recent Labs Lab 07/26/15 0217  INR 1.85*    Sepsis Markers  Recent Labs Lab 07/07/2015 2032 07/11/2015 2231 07/26/15 0217 07/26/15 1508 07/27/15 0436  LATICACIDVEN 4.88* 6.62* 3.7*  --   --   PROCALCITON  --   --   --  5.09 4.33    ABG  Recent Labs Lab 08/01/2015 2010 07/29/2015 2228 07/26/15 0432  PHART 7.383 7.169* 7.420  PCO2ART 30.4* 36.9 28.8*  PO2ART 89.0 129.0* 57.9*    Liver Enzymes  Recent Labs Lab 07/24/2015 2018  AST 24  ALT 15*  ALKPHOS 32*  BILITOT 1.0  ALBUMIN 2.3*    Cardiac Enzymes No results for input(s): TROPONINI, PROBNP in the last 168 hours.  Glucose  Recent Labs Lab 07/26/15 1136 07/26/15 1509 07/26/15 2002 07/27/15 0019 07/27/15 0355 07/27/15 0748  GLUCAP 128* 76 82 92 112* 127*    Imaging Dg Chest Port 1 View  07/27/2015  CLINICAL DATA:  Respiratory distress. EXAM: PORTABLE CHEST 1 VIEW COMPARISON:  Single-view of the chest 07/26/2015. FINDINGS: Endotracheal tube and NG tube have been removed. The lungs are emphysematous. There has been marked worsening in airspace disease in the right chest with dense opacity nap seen in the right mid and lower lung zones. Left suprahilar opacity is identified. Heart size is normal. Aortic atherosclerosis is seen. IMPRESSION: Dense airspace opacity in the right  mid and lower lung zones is markedly worsened since yesterday and compatible with pneumonia or possibly aspiration. Left suprahilar airspace opacity is more conspicuous than on yesterday's examination and worrisome for pneumonia. Recommend followup films to clearing. Emphysema. Atherosclerosis. Electronically Signed   By: Inge Rise M.D.   On: 07/27/2015 08:50   CXR Images personally reviewed, increased density / opacity in RLL  STUDIES:  CT abd 6/19 >> patchy bibasilar airspace opacities, most prominent in RML, small amt ascites within the abd / pelvis, aneurysmal dilation of the infrarenal abdominal aorta (unchanged from 2015), vague soft tissue edema within the upper pelvis, tracking about the small bowel loops, diffuse CAD, enlarged prostate  ECHO 6/20 >> LV with mild concentric hypertrophy, LVEF 45-50%, diffuse hypokinesis, grade 2 diastolic dysfunction, PA peak 38  CULTURES: BCx2 6/19 >> Urine 6/19 >> U. Legionella 6/20 >>  RMSF 6/20 >>   ANTIBIOTICS: Ceftriaxone 6/19 >> Azithromycin 6/19 >> 6/20 Doxycycline 6/20 >>   SIGNIFICANT EVENTS: 6/19  admit with fever, ams, pancytopenia.  Transfused, intubated 6/20  Extubated.  6/21  Developed resp distress early am, lasix + biPAP  LINES/TUBES: ETT 6/19 >> 6/20  DISCUSSION: 70 year old male presented with profound anemia, passed out, PNA, required intubation. Found to have pancytopenia requiring 4 units PRBC. Remained hypotensive. Concern septic shock. Stabilize on vent with CAP coverage.  Serial CBC and transfusion as needed. Nebs and steroids for COPD exacerbation. CT abd pending.   ASSESSMENT / PLAN:  PULMONARY A: Acute Respiratory Distress - early am 6/21, initiated bipap Inability to protect airway in setting AMS - resolved, extubated 6/20 COPD with acute exacerbation Tobacco Abuse - smoked since age 45, heaviest up to 1.5 ppd, now 5-6 cigs per day Possible CAP vs aspiration - appears rounded on CXR, more infiltrative  on CT abd/pelvis L lung nodule from prior CT abdomen  P:   BiPAP x4 hours, then allow for rest 1-2 hours as pt tolerates.  May need to cycle on / off bipap today for respiratory splinting Pulmonary hygiene - IS, mobilize CXR in AM Scheduled Duonebs, PRN albuterol Solumedrol 60mg  q12  Consider non-contrast CT chest to evaluate CT chest  CARDIOVASCULAR A:  Shock - resolved. Suspect septic vs hypovolmia/hemmorhagic as well. S/P 3 units PRBC's, 5L NS Chronic diastolic CHF (Grade 1 DD) AAA 3.88x3.81 CM - unchanged from 2015 H/o CAD  P:  Telemetry monitoring MAP goal > 30mmHg Await ECHO   RENAL A:   AKI Pseudohypocalcemia (corrects to 9.1) AGMA - lactic, clearing   P:   Follow BMP / UOP  Correct electrolytes as indicated NS to KVO S/P 40 mg IV lasix x1 (per [facility])  GASTROINTESTINAL A:   Ascites - small amt noted  on CT, ? Inflammatory vs edema.  Acute hepatitis panel negative, LDH 1455 Protein Calorie Malnutrition  P:   Clear liquid diet  Pepcid for SUP Trend LDH  HEMATOLOGIC Folic Acid >>  123456 >> [id number] Reticulocyte >> 0.9 Peripheral Smear >>  RMSF >> A:   Severe pancytopenia - etiology unclear, could be due to profound infection, has had tick bites in recent week (no rash on exam), concern for malignancy with lab disturbances (decreased appetite, mild weight loss).  S/P 4 units PRBC 6/19 Elevated LDH / anemia, query intravascular hemolysis.   P:  PT/INR Follow up folic acic, peripheral smear. Hematology consult if not improving.  Trend CBC Assess amylase, lipase   INFECTIOUS A:   SIRS/Sepsis ? CAP vs aspiration  P:   ABX as above Trend PCT Monitor WBC and fever curves Assess HIV   ENDOCRINE A:   DM  P:   Hold metformin, glipizide CBG monitoring and SSI  NEUROLOGIC A:   Acute metabolic encephalopathy - resolved  P:   Minimize sedating medications PT efforts, mobilize    FAMILY  - Updates:  Patient updated 6/21 at bedside.   -  Inter-disciplinary family meet or Palliative Care meeting due by:  6/19  Noe Gens, NP-C [place] [facility] Pgr: [phone] or if no answer [phone] 07/27/2015, 9:24 AM  Attending Note:  70 year old male with history of AAA who was intubated for acute respiratory failure due to hypoxemia, likely RLL PNA but also has pan cytopenia and an elevated LDH.  I reviewed CXR myself, RLL infiltrate flourished overnight.  On exam, RLL with crackles.  On BiPAP currently.  Sent a few more labs.  Will await results.  Will diurese some today and if deteriorates then will intubate.  Patient appears comfortable on BiPAP now post diureses.  Will continue to follow and keep in the ICU.  Will ask H/O to evaluate patient.  The patient is critically ill with multiple organ systems failure and requires high complexity decision making for assessment and support, frequent evaluation and titration of therapies, application of advanced monitoring technologies and extensive interpretation of multiple databases.   Critical Care Time devoted to patient care services described in this note is  35  Minutes. This time reflects time of care of this signee Dr [name]. This critical care time does not reflect procedure time, or teaching time or supervisory time of PA/NP/Med student/Med Resident etc but could involve care discussion time.  Rush Farmer, M.D. [facility] Pulmonary/Critical Care Medicine. Pager: [phone]. After hours pager: [phone].

## 2015-07-27 NOTE — Procedures (Signed)
Intubation Procedure Note Arthur Evans [id number] 03-09-1945  Procedure: Intubation Indications: Respiratory insufficiency  Procedure Details Consent: Risks of procedure as well as the alternatives and risks of each were explained to the (patient/caregiver).  Consent for procedure obtained. Time Out: Verified patient identification, verified procedure, site/side was marked, verified correct patient position, special equipment/implants available, medications/allergies/relevent history reviewed, required imaging and test results available.  Performed  Maximum sterile technique was used including gloves, hand hygiene and mask.  MAC    Evaluation Hemodynamic Status: BP stable throughout; O2 sats: stable throughout Patient's Current Condition: stable Complications: No apparent complications Patient did tolerate procedure well. Chest X-ray ordered to verify placement.  CXR: pending.   Arthur Evans 07/27/2015

## 2015-07-28 ENCOUNTER — Inpatient Hospital Stay (HOSPITAL_COMMUNITY): Payer: Commercial Managed Care - HMO

## 2015-07-28 DIAGNOSIS — R7989 Other specified abnormal findings of blood chemistry: Secondary | ICD-10-CM

## 2015-07-28 DIAGNOSIS — J9600 Acute respiratory failure, unspecified whether with hypoxia or hypercapnia: Secondary | ICD-10-CM | POA: Insufficient documentation

## 2015-07-28 DIAGNOSIS — D61818 Other pancytopenia: Secondary | ICD-10-CM

## 2015-07-28 DIAGNOSIS — E43 Unspecified severe protein-calorie malnutrition: Secondary | ICD-10-CM | POA: Insufficient documentation

## 2015-07-28 DIAGNOSIS — E46 Unspecified protein-calorie malnutrition: Secondary | ICD-10-CM

## 2015-07-28 DIAGNOSIS — J189 Pneumonia, unspecified organism: Secondary | ICD-10-CM

## 2015-07-28 DIAGNOSIS — R740 Nonspecific elevation of levels of transaminase and lactic acid dehydrogenase [LDH]: Secondary | ICD-10-CM

## 2015-07-28 LAB — BLOOD GAS, ARTERIAL
ACID-BASE DEFICIT: 0.8 mmol/L (ref 0.0–2.0)
Bicarbonate: 23.5 mEq/L (ref 20.0–24.0)
DRAWN BY: 331761
FIO2: 0.6
LHR: 26 {breaths}/min
MECHVT: 450 mL
O2 Saturation: 95.1 %
PATIENT TEMPERATURE: 98.8
PCO2 ART: 40.3 mmHg (ref 35.0–45.0)
PEEP/CPAP: 10 cmH2O
TCO2: 24.7 mmol/L (ref 0–100)
pH, Arterial: 7.385 (ref 7.350–7.450)
pO2, Arterial: 80.5 mmHg (ref 80.0–100.0)

## 2015-07-28 LAB — CBC WITH DIFFERENTIAL/PLATELET
Basophils Absolute: 0.1 10*3/uL (ref 0.0–0.1)
Basophils Relative: 3 %
EOS PCT: 0 %
Eosinophils Absolute: 0 10*3/uL (ref 0.0–0.7)
HEMATOCRIT: 34.2 % — AB (ref 39.0–52.0)
Hemoglobin: 11.2 g/dL — ABNORMAL LOW (ref 13.0–17.0)
LYMPHS ABS: 1.3 10*3/uL (ref 0.7–4.0)
Lymphocytes Relative: 46 %
MCH: 29.2 pg (ref 26.0–34.0)
MCHC: 32.7 g/dL (ref 30.0–36.0)
MCV: 89.3 fL (ref 78.0–100.0)
MONOS PCT: 50 %
Monocytes Absolute: 1.5 10*3/uL — ABNORMAL HIGH (ref 0.1–1.0)
NEUTROS ABS: 0 10*3/uL — AB (ref 1.7–7.7)
Neutrophils Relative %: 1 %
Platelets: 20 10*3/uL — CL (ref 150–400)
RBC: 3.83 MIL/uL — ABNORMAL LOW (ref 4.22–5.81)
RDW: 20.4 % — AB (ref 11.5–15.5)
WBC: 2.9 10*3/uL — ABNORMAL LOW (ref 4.0–10.5)

## 2015-07-28 LAB — HEPATIC FUNCTION PANEL
ALBUMIN: 1.9 g/dL — AB (ref 3.5–5.0)
ALT: 247 U/L — AB (ref 17–63)
AST: 100 U/L — AB (ref 15–41)
Alkaline Phosphatase: 41 U/L (ref 38–126)
BILIRUBIN DIRECT: 0.7 mg/dL — AB (ref 0.1–0.5)
BILIRUBIN TOTAL: 1.7 mg/dL — AB (ref 0.3–1.2)
Indirect Bilirubin: 1 mg/dL — ABNORMAL HIGH (ref 0.3–0.9)
Total Protein: 5.3 g/dL — ABNORMAL LOW (ref 6.5–8.1)

## 2015-07-28 LAB — GLUCOSE, CAPILLARY
GLUCOSE-CAPILLARY: 151 mg/dL — AB (ref 65–99)
GLUCOSE-CAPILLARY: 149 mg/dL — AB (ref 65–99)
GLUCOSE-CAPILLARY: 183 mg/dL — AB (ref 65–99)
Glucose-Capillary: 167 mg/dL — ABNORMAL HIGH (ref 65–99)
Glucose-Capillary: 166 mg/dL — ABNORMAL HIGH (ref 65–99)
Glucose-Capillary: 280 mg/dL — ABNORMAL HIGH (ref 65–99)

## 2015-07-28 LAB — PHOSPHORUS
PHOSPHORUS: 2.6 mg/dL (ref 2.5–4.6)
PHOSPHORUS: 2.2 mg/dL — AB (ref 2.5–4.6)
PHOSPHORUS: 1.5 mg/dL — AB (ref 2.5–4.6)

## 2015-07-28 LAB — APTT: aPTT: 32 seconds (ref 24–37)

## 2015-07-28 LAB — PNEUMOCYSTIS JIROVECI SMEAR BY DFA: PNEUMOCYSTIS JIROVECI AG: NEGATIVE

## 2015-07-28 LAB — ROCKY MTN SPOTTED FVR ABS PNL(IGG+IGM)
RMSF IGG: POSITIVE — AB
RMSF IGM: 0.48 {index} (ref 0.00–0.89)

## 2015-07-28 LAB — RETICULOCYTES
RBC.: 3.98 MIL/uL — ABNORMAL LOW (ref 4.22–5.81)
RETIC CT PCT: 0.4 % (ref 0.4–3.1)
Retic Count, Absolute: 15.9 10*3/uL — ABNORMAL LOW (ref 19.0–186.0)

## 2015-07-28 LAB — BASIC METABOLIC PANEL
ANION GAP: 11 (ref 5–15)
BUN: 34 mg/dL — ABNORMAL HIGH (ref 6–20)
CALCIUM: 7.7 mg/dL — AB (ref 8.9–10.3)
CHLORIDE: 104 mmol/L (ref 101–111)
CO2: 23 mmol/L (ref 22–32)
CREATININE: 1.59 mg/dL — AB (ref 0.61–1.24)
GFR calc non Af Amer: 42 mL/min — ABNORMAL LOW (ref >60)
GFR, EST AFRICAN AMERICAN: 49 mL/min — AB (ref >60)
Glucose, Bld: 156 mg/dL — ABNORMAL HIGH (ref 65–99)
Potassium: 3.1 mmol/L — ABNORMAL LOW (ref 3.5–5.1)
SODIUM: 138 mmol/L (ref 135–145)

## 2015-07-28 LAB — HAPTOGLOBIN: HAPTOGLOBIN: 518 mg/dL — AB (ref 34–200)

## 2015-07-28 LAB — SAVE SMEAR

## 2015-07-28 LAB — MAGNESIUM
MAGNESIUM: 2.1 mg/dL (ref 1.7–2.4)
Magnesium: 2.2 mg/dL (ref 1.7–2.4)
Magnesium: 1.5 mg/dL — ABNORMAL LOW (ref 1.7–2.4)

## 2015-07-28 LAB — PATHOLOGIST SMEAR REVIEW

## 2015-07-28 LAB — FIBRINOGEN: FIBRINOGEN: 348 mg/dL (ref 204–475)

## 2015-07-28 LAB — RMSF, IGG, IFA: RMSF, IGG, IFA: 1:64 {titer} — ABNORMAL HIGH

## 2015-07-28 LAB — PROCALCITONIN: PROCALCITONIN: 3.71 ng/mL

## 2015-07-28 LAB — LACTATE DEHYDROGENASE: LDH: 613 U/L — ABNORMAL HIGH (ref 98–192)

## 2015-07-28 MED ORDER — PRO-STAT SUGAR FREE PO LIQD
30.0000 mL | Freq: Two times a day (BID) | ORAL | Status: DC
  Filled 2015-07-28: qty 30

## 2015-07-28 MED ORDER — SODIUM CHLORIDE 0.9 % IV SOLN: INTRAVENOUS | Status: DC

## 2015-07-28 MED ORDER — SCOPOLAMINE 1 MG/3DAYS TD PT72
MEDICATED_PATCH | TRANSDERMAL | Status: AC
  Filled 2015-07-28: qty 1

## 2015-07-28 MED ORDER — VITAL HIGH PROTEIN PO LIQD: 1000.0000 mL | ORAL | Status: DC

## 2015-07-28 MED ORDER — VITAL AF 1.2 CAL PO LIQD
1000.0000 mL | ORAL | Status: DC
  Administered 2015-07-28: 1000 mL

## 2015-07-28 MED ORDER — SODIUM CHLORIDE 0.9 % IV BOLUS (SEPSIS)
500.0000 mL | Freq: Once | INTRAVENOUS | Status: AC
  Administered 2015-07-28: 500 mL via INTRAVENOUS

## 2015-07-28 MED ORDER — POTASSIUM CHLORIDE CRYS ER 20 MEQ PO TBCR
40.0000 meq | EXTENDED_RELEASE_TABLET | Freq: Once | ORAL | Status: DC
  Filled 2015-07-28: qty 2

## 2015-07-28 MED ORDER — POTASSIUM CHLORIDE 20 MEQ/15ML (10%) PO SOLN
40.0000 meq | Freq: Once | ORAL | Status: AC
  Administered 2015-07-28: 40 meq via ORAL
  Filled 2015-07-28: qty 30

## 2015-07-28 NOTE — Progress Notes (Signed)
OG tube occluded. Attempted to dissolve clog with coke. Verbal order per Dr. Nelda Marseille to remove old OG tube and replace with new tube. XRay ordered per protocol to confirm placement. Will continue to monitor.

## 2015-07-28 NOTE — Progress Notes (Signed)
PULMONARY / CRITICAL CARE MEDICINE   Name: Arthur Evans. MRN: [id number] DOB: 13-Aug-1945    ADMISSION DATE:  07/27/2015 CONSULTATION DATE:  6/19  REFERRING MD:  Dr. Jeanell Sparrow EDP  CHIEF COMPLAINT:  AMS  HISTORY OF PRESENT ILLNESS:  70 year old male with PMH as below, which includes AAA, DM on metformin, MI, DVT, and seizure disorder (thought due to hypoglycemia.  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 He presented to [facility] ED 6/19 with complaints of generalized weakness for 4 days. In ED he was found to be confused, febrile to 102.3 and hypotensive. He denied and symptoms of GI bleed, but hemoglobin was found to be 4.3. He required intubation for airway protection. He was transfused 2 units of emergent release blood and PCCM was called for admission.    SUBJECTIVE:  Reintubated overnight with hemoptysis, respiratory distress.  Pt denies pain/SOB   VITAL SIGNS: BP 136/95 mmHg  Pulse 115  Temp(Src) 99 F (37.2 C) (Core (Comment))  Resp 22  Ht _0  (1.727 m)  Wt 129 lb 3 oz (58.6 kg)  BMI 19.65 kg/m2  SpO2 92%  HEMODYNAMICS:    VENTILATOR SETTINGS: Vent Mode:  [-] PRVC FiO2 (%):  [50 %-60 %] 50 % Set Rate:  [12 bmp-26 bmp] 26 bmp Vt Set:  [450 mL] 450 mL PEEP:  [5 cmH20-10 cmH20] 10 cmH20 Plateau Pressure:  [23 cmH20-29 cmH20] 29 cmH20  INTAKE / OUTPUT: I/O last 3 completed shifts: In: 485.3 [P.O.:150; I.V.:235.3; IV Piggyback:100] Out: 3025 [Urine:3025]  PHYSICAL EXAMINATION: General: elderly male in NAD on NRB Neuro: RASS 0, follows commands, MAE, speech clear, oriented / appropriate HEENT: [place]/AT, PERRL 35m, pale conjunctiva Cardiovascular: RRR, no MRG, +2 pulses, no edema Lungs: mildly labored, lungs bilaterally with wheezing, coarse Abdomen: Soft, non-distended, BS+ Musculoskeletal: No acute deformity or ROM  limitation Skin: Grossly intact, skin warm and dry.   LABS:  BMET  Recent Labs Lab 07/26/15 0217 07/27/15 0843 07/28/15 0336  NA 135 139 138  K 3.8 3.4* 3.1*  CL 106 106 104  CO2 18* 21* 23  BUN 28* 29* 34*  CREATININE 1.64* 1.65* 1.59*  GLUCOSE 273* 127* 156*    Electrolytes  Recent Labs Lab 07/26/15 0217  07/26/15 1508 07/27/15 0436 07/27/15 0843 07/28/15 0336  CALCIUM 6.9*  --   --   --  7.8* 7.7*  MG 1.5*  < > 1.6* 2.1  --  2.1  PHOS 3.5  < > 2.6 2.6  --  2.6  < > = values in this interval not displayed.  CBC  Recent Labs Lab 07/27/15 0436 07/27/15 0843 07/28/15 0336  WBC 3.2* 2.2* 2.9*  HGB 10.8* 12.5* 11.2*  HCT 31.9* 37.4* 34.2*  PLT 16* 22* 20*    Coag's  Recent Labs Lab 07/26/15 0217 07/28/15 0336  APTT  --  32  INR 1.85*  --     Sepsis Markers  Recent Labs Lab 07/31/2015 2032 08/01/2015 2231 07/26/15 0217 07/26/15 1508 07/27/15 0436 07/28/15 0336  LATICACIDVEN 4.88* 6.62* 3.7*  --   --   --   PROCALCITON  --   --   --  5.09 4.33 3.71    ABG  Recent Labs Lab 07/29/2015 2010 07/27/2015 2228 07/26/15 0432  PHART 7.383 7.169* 7.420  PCO2ART 30.4* 36.9 28.8*  PO2ART 89.0 129.0* 57.9*    Liver Enzymes  Recent Labs Lab 08/01/2015 2018 07/27/15 0843 07/28/15 0336  AST 24 <5* 100*  ALT 15* 442* 247*  ALKPHOS 32* 52 41  BILITOT 1.0 0.9 1.7*  ALBUMIN 2.3* 2.3* 1.9*    Cardiac Enzymes  Recent Labs Lab 07/27/15 0843  TROPONINI 0.23*    Glucose  Recent Labs Lab 07/27/15 1056 07/27/15 1542 07/27/15 1926 07/27/15 2319 07/28/15 0323 07/28/15 0742  GLUCAP 90 167* 171* 167* 166* 151*    Imaging Dg Chest Port 1 View  07/28/2015  CLINICAL DATA:  Acute respiratory failure EXAM: PORTABLE CHEST 1 VIEW COMPARISON:  07/27/2015 FINDINGS: Interval intubation. Endotracheal tube in good position. NG tube in the stomach Dense infiltrate in the right lung base is slightly larger. Possible small right effusion. Mild left lower  lobe airspace disease. IMPRESSION: Endotracheal tube in good position Progression of large area of infiltrate in the right lung base, probable pneumonia. Electronically Signed   By: Franchot Gallo M.D.   On: 07/28/2015 07:06   CXR Images personally reviewed, increased density / opacity in RLL  STUDIES:  CT abd 6/19 >> patchy bibasilar airspace opacities, most prominent in RML, small amt ascites within the abd / pelvis, aneurysmal dilation of the infrarenal abdominal aorta (unchanged from 2015), vague soft tissue edema within the upper pelvis, tracking about the small bowel loops, diffuse CAD, enlarged prostate  ECHO 6/20 >> LV with mild concentric hypertrophy, LVEF 45-50%, diffuse hypokinesis, grade 2 diastolic dysfunction, PA peak 38  CULTURES: BCx2 6/19 >> Urine 6/19 >> negative U. Legionella 6/20 >> negative  RMSF 6/20 >>  PCP DFA 6/21 >> negative  BAL 6/21 >>  HIV 6/21 >> neg  HEMATOLOGY: Folate >> 1164 B12 >> 998 Reticulocyte >> 32.1, 15.9 Peripheral Smear >> pancytopenia  RMSF >>  ANTIBIOTICS: Ceftriaxone 6/19 >> Azithromycin 6/19 >> 6/20 Doxycycline 6/20 >>   SIGNIFICANT EVENTS: 6/19  admit with fever, ams, pancytopenia.  Transfused, intubated 6/20  Extubated.  6/21  Developed resp distress early am, lasix + biPAP.  Failed, reintubated   LINES/TUBES: ETT 6/19 >> 6/20  DISCUSSION: 70 year old male presented with profound anemia, passed out, PNA, required intubation. Found to have pancytopenia requiring 4 units PRBC. Remained hypotensive. Concern septic shock. Stabilize on vent with CAP coverage.  Serial CBC and transfusion as needed. Nebs and steroids for COPD exacerbation. CT abd pending.   ASSESSMENT / PLAN:  PULMONARY A: Acute Respiratory Distress - early am 6/21, initiated bipap RLL PNA Inability to protect airway in setting AMS - resolved, extubated 6/20.  Failed 6/21, reintubated.   COPD with acute exacerbation Tobacco Abuse - smoked since age 65, heaviest  up to 1.5 ppd, now 5-6 cigs per day Possible CAP vs aspiration - appears rounded on CXR, more infiltrative on CT abd/pelvis L lung nodule from prior CT abdomen  P:   PRVC 8 cc/kg  Wean PEEP / FiO2 for sats > 88% Assess ABG, place aline for lab draws  Pulmonary hygiene - IS, mobilize CXR in AM Scheduled Duonebs, PRN albuterol Continue Solumedrol 57m q12  Consider non-contrast CT chest to evaluate RLL  CARDIOVASCULAR A:  Shock - resolved. Suspect septic vs hypovolmia/hemmorhagic as well. S/P 3 units PRBC's, 5L NS Chronic diastolic CHF (Grade 1 DD) AAA 3.88x3.81 CM - unchanged from 2015 H/o CAD  P:  Telemetry monitoring MAP goal > 670mg  RENAL A:   AKI Pseudohypocalcemia (corrects to 9.1) AGMA - lactic, clearing  P:   Follow BMP / UOP  Correct electrolytes as indicated NS to [facility] Fluid challenge with decreased UOP > 500 ml bolus, then 50 ml/hr  GASTROINTESTINAL A:   Ascites - small amt noted on CT, ? Inflammatory vs edema.  Acute hepatitis panel negative, LDH 1455 Protein Calorie Malnutrition  P:   NPO Insert OGT  Begin TF Pepcid for SUP Trend LDH  HEMATOLOGIC A:   Severe pancytopenia - etiology unclear, could be due to profound infection, has had tick bites in recent week (no rash on exam), concern for malignancy with lab disturbances (decreased appetite, mild weight loss).  S/P 4 units PRBC 6/19 Elevated LDH / anemia, query intravascular hemolysis, MDS??   P:  PT/INR Follow up folic acic, peripheral smear. Hematology consulted Trend CBC Assess amylase, lipase  Discussed with Dr. Alvy Bimler >> concern for MDS on review.  Will need bone marrow biopsy.  Concern for aggressive process given normal labs last year.  Will need cytogenetics & molecular testing (usually takes 7-10 days for results, has to go to [facility])  INFECTIOUS A:   SIRS/Sepsis ? CAP vs aspiration  P:   ABX as above Trend PCT Monitor WBC and fever curves  ENDOCRINE A:   DM  P:   Hold  metformin, glipizide CBG monitoring and SSI  NEUROLOGIC A:   Acute metabolic encephalopathy - resolved  P:   Minimize sedating medications PT efforts, mobilize    FAMILY  - Updates:  Patient updated on plan of care 6/22.   - Inter-disciplinary family meet or Palliative Care meeting due by:  6/19  Noe Gens, NP-C [place] [facility] Pgr: [phone] or if no answer [phone] 07/28/2015, 9:16 AM  Attending Note:  70 year old male with history of AAA who was intubated for acute respiratory failure due to hypoxemia, likely RLL PNA but also has pan cytopenia and an elevated LDH. I reviewed CXR myself, RLL infiltrate flourished overnight, will order a chest CT. On exam, RLL with crackles. Currently intubated.  Discussed with H/O MD, will perform a bone marrow biopsy in AM.  Family updated bedside.  Will Continue lasix in the meantime and await results.  The patient is critically ill with multiple organ systems failure and requires high complexity decision making for assessment and support, frequent evaluation and titration of therapies, application of advanced monitoring technologies and extensive interpretation of multiple databases.   Critical Care Time devoted to patient care services described in this note is 35 Minutes. This time reflects time of care of this signee Dr [name]. This critical care time does not reflect procedure time, or teaching time or supervisory time of PA/NP/Med student/Med Resident etc but could involve care discussion time.  Rush Farmer, M.D. [facility] Pulmonary/Critical Care Medicine. Pager: [phone]. After hours pager: [phone].

## 2015-07-28 NOTE — Progress Notes (Signed)
Initial Nutrition Assessment  DOCUMENTATION CODES:   Severe malnutrition in context of chronic illness  INTERVENTION:    Initiate TF via OGT with Vital AF 1.2 at goal rate of 60 ml/h (1440 ml per day) to provide 1728 kcals, 108 gm protein, 1168 ml free water daily.  NUTRITION DIAGNOSIS:   Inadequate oral intake related to inability to eat as evidenced by NPO status.  GOAL:   Patient will meet greater than or equal to 90% of their needs  MONITOR:   Vent status, Labs, Weight trends, TF tolerance, I & O's  REASON FOR ASSESSMENT:   Consult Enteral/tube feeding initiation and management  ASSESSMENT:   70 year old male presented with AMS requiring intubation. Found to have pancytopenia requiring 4 units PRBC. Remained hypotensive. Concern septic shock.   Nutrition-Focused physical exam completed. Findings are severe fat depletion, severe muscle depletion, and no edema.  Received MD Consult for TF initiation and management. Patient is currently intubated on ventilator support MV: 11.7 L/min Temp (24hrs), Avg:99.5 F (37.5 C), Min:99 F (37.2 C), Max:100.2 F (37.9 C)  Propofol: none   Diet Order:  Diet NPO time specified  Skin:  Reviewed, no issues  Last BM:  unknown  Height:   Ht Readings from Last 1 Encounters:  07/22/2015 5\' 8"  (1.727 m)    Weight:   Wt Readings from Last 1 Encounters:  07/28/15 129 lb 3 oz (58.6 kg)    Ideal Body Weight:  70 kg  BMI:  Body mass index is 19.65 kg/(m^2).  Estimated Nutritional Needs:   Kcal:  [id number]  Protein:  90-110 gm  Fluid:  2 L  EDUCATION NEEDS:   No education needs identified at this time  Molli Barrows, [place], [place], [place] Pager [phone] After Hours Pager [phone]

## 2015-07-28 NOTE — Procedures (Signed)
Arterial Catheter Insertion Procedure Note Meir Jentsch [id number] 1945/06/27  Procedure: Insertion of Arterial Catheter  Indications: Blood pressure monitoring and Frequent blood sampling  Procedure Details Consent: Risks of procedure as well as the alternatives and risks of each were explained to the (patient/caregiver).  Consent for procedure obtained. Time Out: Verified patient identification, verified procedure, site/side was marked, verified correct patient position, special equipment/implants available, medications/allergies/relevent history reviewed, required imaging and test results available.  Performed  Maximum sterile technique was used including antiseptics, cap, gloves, gown, hand hygiene, mask and sheet. Skin prep: Chlorhexidine; local anesthetic administered 20 gauge catheter was inserted into right radial artery using the Seldinger technique.  Evaluation Blood flow good; BP tracing good. Complications: No apparent complications.   [name] 07/28/2015

## 2015-07-28 NOTE — Progress Notes (Signed)
[place] NOTE  Patient Care Team: Janith Lima, MD as PCP - General Larey Dresser, MD as Consulting Physician (Cardiology) Garald Balding, MD as Consulting Physician (Orthopedic Surgery)  CHIEF COMPLAINTS/PURPOSE OF CONSULTATION:  Pancytopenia  HISTORY OF PRESENTING ILLNESS:  Arthur Evans. 70 y.o. male is here because of pancytopenia. I received this consult yesterday. The patient is intubated. History is not possible. I review his records extensively. The patient had normal CBC last year on 06/07/2014. He was brought into the emergency department on 07/07/2015 with findings of pancytopenia. The patient presented with fever, generalized weakness, confusion and hypotension. White blood cell count was 3.0, hemoglobin 4.3, with elevated MCV and platelet count of 22. He received 4 units of blood transfusion. Since then, the white blood cell count fluctuated from 2.2-3.2, hemoglobin is steady at around 10-13 grams and platelet count fluctuated from 16-22,000 Apart from bruising, there were no reported spontaneous epistaxis, hematuria, melena or hematochezia. Apparently, after intubation, they were reported hemoptysis but I think could be trauma related. The patient has no prior history of liver disease, exposure to heparin, history of cardiac murmur/prior cardiovascular surgery or recent new medications He has no blood or platelet transfusions The patient has CT imaging done which showed normal liver and spleen. Since admission, he was noted to have grossly elevated liver function test.  MEDICAL HISTORY:  Past Medical History  Diagnosis Date  . AAA (abdominal aortic aneurysm) ([place])   . History of trauma 70    [name] fell on him causing severe back injury  . Diabetes mellitus   . Arthritis   . Anxiety   . Depression   . Myocardial infarction [facility]) July 2010  . DVT (deep venous thrombosis) ([place])   . Seizure disorder [facility]) July 2016    SURGICAL  HISTORY: Past Surgical History  Procedure Laterality Date  . Back surgery  1983  . Vasectomy    . Cardiac catheterization      cardiac stents  . Spine surgery      SOCIAL HISTORY: Social History   Social History  . Marital Status: Divorced    Spouse Name: N/A  . Number of Children: 4  . Years of Education: N/A   Occupational History  . Driver    Social History Main Topics  . Smoking status: Current Every Day Smoker -- 0.00 packs/day for 50 years    Types: Cigarettes, E-cigarettes  . Smokeless tobacco: Current User     Comment: trying to quit; has decreased from 2 ppd to 1/2  ppd/ 8 packs   . Alcohol Use: No     Comment: none  . Drug Use: No  . Sexual Activity: Not on file   Other Topics Concern  . Not on file   Social History Narrative    FAMILY HISTORY: Family History  Problem Relation Age of Onset  . Colon cancer Neg Hx   . Rectal cancer Neg Hx   . Stomach cancer Neg Hx   . Esophageal cancer Neg Hx   . Diabetes Mother   . Hyperlipidemia Mother   . Hypertension Mother   . Heart attack Mother   . Heart disease Father     Before age 65  . Hyperlipidemia Father   . Hypertension Father   . Heart attack Father   . Diabetes Sister   . Hypertension Sister   . Heart attack Sister   . Heart disease Sister     After age  26  . Hyperlipidemia Sister   . Cancer Brother     Thyroid  . Hypertension Brother   . Heart disease Brother     after age 61  . Heart attack Brother     ALLERGIES:  is allergic to novocain.  MEDICATIONS:  Current Facility-Administered Medications  Medication Dose Route Frequency Provider Last Rate Last Dose  . 0.9 %  sodium chloride infusion  250 mL Intravenous PRN Corey Harold, NP      . 0.9 %  sodium chloride infusion   Intravenous Continuous Donita Brooks, NP 50 mL/hr at 07/28/15 1030    . albuterol (PROVENTIL) (2.5 MG/3ML) 0.083% nebulizer solution 2.5 mg  2.5 mg Nebulization Q2H PRN Corey Harold, NP   2.5 mg at 07/27/15 8676   . antiseptic oral rinse solution (CORINZ)  7 mL Mouth Rinse 10 times per day Rush Farmer, MD   7 mL at 07/28/15 1107  . cefTRIAXone (ROCEPHIN) 1 g in dextrose 5 % 50 mL IVPB  1 g Intravenous Q24H Rachel L Rumbarger, RPH   1 g at 07/27/15 2130  . chlorhexidine gluconate (SAGE KIT) (PERIDEX) 0.12 % solution 15 mL  15 mL Mouth Rinse BID Rush Farmer, MD   15 mL at 07/28/15 0819  . doxycycline (VIBRA-TABS) tablet 100 mg  100 mg Oral Q12H Priscella Mann, RPH   100 mg at 07/28/15 0805  . famotidine (PEPCID) IVPB 20 mg premix  20 mg Intravenous Q24H Donita Brooks, NP   20 mg at 07/28/15 1103  . feeding supplement (VITAL AF 1.2 CAL) liquid 1,000 mL  1,000 mL Per Tube Continuous Rush Farmer, MD      . fentaNYL (SUBLIMAZE) 2,500 mcg in sodium chloride 0.9 % 250 mL (10 mcg/mL) infusion  0-400 mcg/hr Intravenous Continuous Rush Farmer, MD 10 mL/hr at 07/28/15 0805 100 mcg/hr at 07/28/15 0805  . insulin aspart (novoLOG) injection 0-20 Units  0-20 Units Subcutaneous Q4H Chesley Mires, MD   4 Units at 07/28/15 0801  . ipratropium-albuterol (DUONEB) 0.5-2.5 (3) MG/3ML nebulizer solution 3 mL  3 mL Nebulization Q6H Corey Harold, NP   3 mL at 07/28/15 0824  . methylPREDNISolone sodium succinate (SOLU-MEDROL) 125 mg/2 mL injection 60 mg  60 mg Intravenous Q12H Donita Brooks, NP   60 mg at 07/28/15 0805  . midazolam (VERSED) injection 2 mg  2 mg Intravenous Q1H PRN Rush Farmer, MD   2 mg at 07/27/15 2047  . scopolamine (TRANSDERM-SCOP) 1 MG/3DAYS             REVIEW OF SYSTEMS:  Unable to obtain  PHYSICAL EXAMINATION: ECOG PERFORMANCE STATUS: 4 - Bedbound  Filed Vitals:   07/28/15 1000 07/28/15 1100  BP: 122/70 112/64  Pulse: 104 92  Temp: 99 F (37.2 C) 98.8 F (37.1 C)  Resp: 22 26   Filed Weights   07/26/15 0414 07/27/15 0500 07/28/15 0500  Weight: 132 lb 11.5 oz (60.2 kg) 134 lb 11.2 oz (61.1 kg) 129 lb 3 oz (58.6 kg)    GENERAL:He was intubated but appears to be arousable and  following commands SKIN: No skin bruises. EYES: normal, conjunctiva are pink and non-injected, sclera clear OROPHARYNX:no exudate, no erythema and lips, buccal mucosa, and tongue normal . ET tube in situ NECK: supple, thyroid normal size, non-tender, without nodularity LYMPH:  no palpable lymphadenopathy in the cervical, axillary or inguinal LUNGS: clear to auscultation and percussion with normal breathing effort  HEART: regular rate & rhythm and no murmurs and no lower extremity edema ABDOMEN:abdomen soft, non-tender and normal bowel sounds Musculoskeletal:no cyanosis of digits and no clubbing  PSYCH: alert  NEURO: Unable to assess but appears to be able to follow commands  LABORATORY DATA:  I have reviewed the data as listed Lab Results  Component Value Date   WBC 2.9* 07/28/2015   HGB 11.2* 07/28/2015   HCT 34.2* 07/28/2015   MCV 89.3 07/28/2015   PLT 20* 07/28/2015   Lab Results  Component Value Date   ALT 247* 07/28/2015   AST 100* 07/28/2015   ALKPHOS 41 07/28/2015   BILITOT 1.7* 07/28/2015     RADIOGRAPHIC STUDIES: I have personally reviewed the radiological images as listed and agreed with the findings in the report. Ct Abdomen Pelvis Wo Contrast  07/26/2015  CLINICAL DATA:  Follow-up abdominal aortic aneurysm. Initial encounter. EXAM: CT ABDOMEN AND PELVIS WITHOUT CONTRAST TECHNIQUE: Multidetector CT imaging of the abdomen and pelvis was performed following the standard protocol without IV contrast. COMPARISON:  CT of the abdomen and pelvis from 10/27/2013 FINDINGS: Patchy bibasilar airspace opacities are noted, most prominent at the right middle lobe, compatible with multifocal pneumonia. Diffuse coronary artery calcifications are seen. A small amount of free fluid is tracking about the inferior tip of the liver, and within the pelvis. Trace pericholecystic fluid is nonspecific in the presence of ascites. The gallbladder is otherwise grossly unremarkable. The liver and  spleen are unremarkable in appearance. The pancreas and adrenal glands are unremarkable. Scattered vascular calcifications are seen at the renal hila bilaterally. There is no evidence of hydronephrosis. No renal or ureteral stones are seen. No perinephric stranding is appreciated. No free fluid is otherwise seen. The small bowel is unremarkable in appearance. The patient's enteric tube is seen ending at the antrum of the stomach. The stomach is within normal limits. No acute vascular abnormalities are seen. There is aneurysmal dilatation of the infrarenal abdominal aorta to 4.0 cm in AP and transverse dimensions, nearly unchanged from 2015. Aneurysmal dilatation resolves at the aortic bifurcation. Scattered calcification is noted along the superior mesenteric artery. Scattered calcification is noted along the abdominal aorta and its branches. The appendix is normal in caliber, without evidence of appendicitis. The colon is largely decompressed and grossly unremarkable. Vague soft tissue edema is noted within the upper pelvis, tracking about small bowel loops. The bladder is relatively decompressed. A small amount of air is noted within the bladder, with a Foley catheter in place. Apparent bladder wall thickening is thought to reflect relative decompression. The prostate is enlarged, measuring 5.8 cm in transverse dimension. No inguinal lymphadenopathy is seen. No acute osseous abnormalities are identified. Facet disease is noted at the lower lumbar spine. Thoracolumbar spinal fusion hardware is partially imaged. IMPRESSION: 1. Patchy bibasilar airspace opacities, most prominent at the right middle lobe, compatible with multifocal pneumonia. 2. Small amount of ascites within the abdomen and pelvis. 3. Aneurysmal dilatation of the infrarenal abdominal aorta to 4.0 cm in AP and transverse dimension, nearly unchanged from 2015. Scattered calcification along the abdominal aorta and its branches, including along the  superior mesenteric artery. Aneurysmal dilatation resolves at the aortic bifurcation. Recommend followup by ultrasound in 1 year. This recommendation follows ACR consensus guidelines: White Paper of the ACR Incidental Findings Committee II on Vascular Findings. J Am Coll Radiol 2013; 10:789-794. 4. Vague soft tissue edema within the upper pelvis, tracking about small bowel loops. This may reflect the patient's underlying volume  status, or possibly a mild infectious or inflammatory process involving the small bowel. Would correlate with the patient's symptoms. 5. Diffuse coronary artery calcifications seen. 6. Enlarged prostate noted. These results were called by telephone at the time of interpretation on 07/26/2015 at 12:07 am to [facility] on [facility], who verbally acknowledged these results. Electronically Signed   By: Garald Balding M.D.   On: 07/26/2015 00:11   Dg Chest Port 1 View  07/28/2015  CLINICAL DATA:  Acute respiratory failure EXAM: PORTABLE CHEST 1 VIEW COMPARISON:  07/27/2015 FINDINGS: Interval intubation. Endotracheal tube in good position. NG tube in the stomach Dense infiltrate in the right lung base is slightly larger. Possible small right effusion. Mild left lower lobe airspace disease. IMPRESSION: Endotracheal tube in good position Progression of large area of infiltrate in the right lung base, probable pneumonia. Electronically Signed   By: Franchot Gallo M.D.   On: 07/28/2015 07:06   Dg Chest Port 1 View  07/27/2015  CLINICAL DATA:  Respiratory distress. EXAM: PORTABLE CHEST 1 VIEW COMPARISON:  Single-view of the chest 07/26/2015. FINDINGS: Endotracheal tube and NG tube have been removed. The lungs are emphysematous. There has been marked worsening in airspace disease in the right chest with dense opacity nap seen in the right mid and lower lung zones. Left suprahilar opacity is identified. Heart size is normal. Aortic atherosclerosis is seen. IMPRESSION: Dense airspace opacity in the right  mid and lower lung zones is markedly worsened since yesterday and compatible with pneumonia or possibly aspiration. Left suprahilar airspace opacity is more conspicuous than on yesterday's examination and worrisome for pneumonia. Recommend followup films to clearing. Emphysema. Atherosclerosis. Electronically Signed   By: Inge Rise M.D.   On: 07/27/2015 08:50   Dg Chest Port 1 View  07/26/2015  CLINICAL DATA:  Right lung infiltrate.  Shortness of breath . EXAM: PORTABLE CHEST 1 VIEW COMPARISON:  07/22/2015. FINDINGS: Endotracheal tube and NG tube in stable position. New onset of right lower lobe infiltrate consistent pneumonia. No pleural effusion or pneumothorax. Prior thoracolumbar spine fusion . IMPRESSION: 1. Lines and tubes in stable position. 2.  New onset right lower lobe infiltrate consistent pneumonia. Electronically Signed   By: Marcello Moores  Register   On: 07/26/2015 06:46   Dg Chest Portable 1 View  07/29/2015  CLINICAL DATA:  Endotracheal tube and orogastric tube placement. Generalized weakness and nausea. Confusion. Initial encounter. EXAM: PORTABLE CHEST 1 VIEW COMPARISON:  Chest radiograph performed earlier today at 7:56 p.m. FINDINGS: The patient's endotracheal tube is seen ending 3-4 cm above the carina. An enteric tube is noted extending below the diaphragm. Vascular congestion is noted, somewhat more prominent than on the recent prior study, with perihilar opacity, which may reflect pneumonia or pulmonary edema. No pleural effusion or pneumothorax is seen The cardiomediastinal silhouette remains normal in size. No acute osseous abnormalities are identified. Thoracolumbar spinal fusion hardware is partially imaged. IMPRESSION: 1. Endotracheal tube seen ending 3-4 cm above the carina. 2. Enteric tube noted extending below the diaphragm. 3. Vascular congestion noted, somewhat more prominent than on the recent prior study, with perihilar opacity, which may reflect pneumonia or pulmonary edema.  Electronically Signed   By: Garald Balding M.D.   On: 07/26/2015 21:57   Dg Chest Port 1 View  07/28/2015  CLINICAL DATA:  Dyspnea and cough for an unknown period of time. Neighbor reports the pt has been experiencing nausea, anorexia, and weakness. Hx of DM, MI, AND COPD. Pt is a current smoker.  EXAM: PORTABLE CHEST - 1 VIEW COMPARISON:  06/07/2014 FINDINGS: Patchy right perihilar airspace disease, new since previous. Left lung clear. Heart size normal.  Mildly tortuous atheromatous aorta. No effusion. No pneumothorax.Surgical fixation hardware overlies the lower thoracic spine, incompletely visualized distally. IMPRESSION: 1. Patchy right perihilar airspace disease, possibly infectious/inflammatory process. Consider follow-up to confirm appropriate resolution, exclude neoplasm. Electronically Signed   By: Lucrezia Europe M.D.   On: 07/28/2015 20:08   Dg Abd Portable 1v  07/28/2015  CLINICAL DATA:  Encounter for orogastric tube placement. EXAM: PORTABLE ABDOMEN - 1 VIEW COMPARISON:  07/28/2015 FINDINGS: There has been advancement of the OG tube. The tip is in the body of stomach. The side port of the OG tube is now all approximately 11 cm below the GE junction. Persistent airspace opacification involving the right lung. IMPRESSION: 1. Interval advancement of the OG tube with side port well below the level of the GE junction. Electronically Signed   By: Kerby Moors M.D.   On: 07/28/2015 10:32   Dg Abd Portable 1v  07/28/2015  CLINICAL DATA:  Assess OG tube placement EXAM: PORTABLE ABDOMEN - 1 VIEW COMPARISON:  07/08/2015 FINDINGS: Orogastric tube tip is in the projection of the expected location body of stomach. Side port is below the level of the GE junction. Diffuse airspace opacities are identified within the right lung. The bowel gas pattern appears nonobstructive. IMPRESSION: 1. Side port of the OG tube is below the level of the GE junction. Electronically Signed   By: Kerby Moors M.D.   On:  07/28/2015 09:41   I reviewed his peripheral blood smear. Of note, it is difficult to comment due to recent blood transfusion. However, I see numerous teardrop cells, absolute thrombocytopenia without evidence of platelet clumping, no evidence of schistocytes and increased circulating monocytes with occasional blasts ASSESSMENT & PLAN  Pancytopenia I have ordered extensive workup. There were no evidence of coagulopathy today to suggest DIC. He had elevated INR 2 days ago but that could be due to synthetic liver dysfunction I'm worried that the patient may have myelodysplastic syndrome/AML causing pancytopenia I recommend bone marrow aspirate and biopsy I discussed with primary service who had obtained consent from family members I plan to come in tomorrow morning around 8 AM to perform bed side bone marrow biopsy for further evaluation In the meantime, if the patient has signs of bleeding, proceed with platelet transfusion regardless of platelet count. If the patient has no signs of bleeding, it is reasonable to give prophylactic platelet transfusion only if platelet count is less than 10,000  Pneumonia He is receiving broad-spectrum IV antibiotics. Defer to primary service  Acute renal failure Could be due to dehydration Recommend IV fluids  Elevated LDH He had mild elevated total bilirubin  He has no reticulocytosis Certainly this could be related to ineffective erythropoiesis rather than due to hemolysis  Abnormal liver function test Cause is unknown. CT imaging showed normal-appearing liver. Echocardiogram show recent diffuse hypokinesis. Certainly could be due to ischemic hepatitis. Recommend close follow-up of liver function test  Protein calorie malnutrition Defer to primary service to initiate enteral feeding  DVT prophylaxis On SCD only due to severe thrombocytopenia  Discharge planning The patient is critically ill and will remain in ICU I will return tomorrow to  perform bone marrow biopsy. Please call if questions arise  [facility], Juan Olthoff, MD 07/28/2015

## 2015-07-28 NOTE — Progress Notes (Addendum)
Rt note: At ~ 2030 hrs. pt. pulled at E.T. tube after becoming agitated or confused, RN and multiple staff aware at bedside, this RT was also notified while in next room, upon my arrival RN had hold of E.T. tube, noted return Vt's were markedly less than set Vt and audible air leak noted although O2 saturation remained mid to high 90's, RT obtained EtC02 cap from airway box which initially was purple=(-), with sats remaining mid 90;s this RT deflated cuff with 10cc syringe and gently re-advanced ET tube to 25cm, the original placement charted from 23cm currently noted position with return Vt's increasing and EtC02 becoming yellow=(+) after re inflating cuff, b/l b.s. returned, replaced ETAD with another RT, no complications, RT to monitor.

## 2015-07-28 NOTE — Progress Notes (Addendum)
Second OG tube also occluded after insertion. Attempted to readjust tube position by turning. Second nurse called to bedside. OG tube removed and re-inserted. Auscultated by two RNs. XRay ordered to confirm placement. Will continue to monitor.

## 2015-07-28 NOTE — Progress Notes (Signed)
Attempted to call daughter Natnael Biederman at [phone]) for consent.  Unable to reach her via phone.  Called the patients son and consented via phone for bone marrow biopsy for am 6/23.  Risks (bleeding, infection, tumor seeding, pain, and needle breakage) and benefits discussed with patient.   Noe Gens, NP-[facility] Pgr: [phone] or if no answer [phone] 07/28/2015, 10:11 AM

## 2015-07-28 NOTE — Progress Notes (Signed)
[place] Progress Note Patient Name: Arthur Evans. DOB: [birth year] MRN: [id number]   Date of Service  07/28/2015  HPI/Events of Note  K=3.1  eICU Interventions  replaced     Intervention Category Intermediate Interventions: Electrolyte abnormality - evaluation and management  Herschell Virani 07/28/2015, 6:28 AM

## 2015-07-29 ENCOUNTER — Inpatient Hospital Stay (HOSPITAL_COMMUNITY): Payer: Commercial Managed Care - HMO

## 2015-07-29 LAB — TYPE AND SCREEN
ABO/RH(D): O POS
ANTIBODY SCREEN: NEGATIVE
UNIT DIVISION: 0
UNIT DIVISION: 0
Unit division: 0
Unit division: 0
Unit division: 0
Unit division: 0

## 2015-07-29 LAB — MAGNESIUM
Magnesium: 3 mg/dL — ABNORMAL HIGH (ref 1.7–2.4)
Magnesium: 2.7 mg/dL — ABNORMAL HIGH (ref 1.7–2.4)

## 2015-07-29 LAB — CBC
HCT: 29.6 % — ABNORMAL LOW (ref 39.0–52.0)
HEMOGLOBIN: 9.8 g/dL — AB (ref 13.0–17.0)
MCH: 29.8 pg (ref 26.0–34.0)
MCHC: 33.1 g/dL (ref 30.0–36.0)
MCV: 90 fL (ref 78.0–100.0)
PLATELETS: 13 10*3/uL — AB (ref 150–400)
RBC: 3.29 MIL/uL — AB (ref 4.22–5.81)
RDW: 20.3 % — ABNORMAL HIGH (ref 11.5–15.5)
WBC: 4.8 10*3/uL (ref 4.0–10.5)

## 2015-07-29 LAB — BASIC METABOLIC PANEL
ANION GAP: 8 (ref 5–15)
Anion gap: 7 (ref 5–15)
BUN: 39 mg/dL — ABNORMAL HIGH (ref 6–20)
BUN: 38 mg/dL — AB (ref 6–20)
CALCIUM: 8 mg/dL — AB (ref 8.9–10.3)
CO2: 24 mmol/L (ref 22–32)
CO2: 26 mmol/L (ref 22–32)
Calcium: 7.9 mg/dL — ABNORMAL LOW (ref 8.9–10.3)
Chloride: 109 mmol/L (ref 101–111)
Chloride: 110 mmol/L (ref 101–111)
Creatinine, Ser: 1.44 mg/dL — ABNORMAL HIGH (ref 0.61–1.24)
Creatinine, Ser: 1.4 mg/dL — ABNORMAL HIGH (ref 0.61–1.24)
GFR calc Af Amer: 55 mL/min — ABNORMAL LOW (ref >60)
GFR calc Af Amer: 57 mL/min — ABNORMAL LOW (ref >60)
GFR calc non Af Amer: 47 mL/min — ABNORMAL LOW (ref >60)
GFR, EST NON AFRICAN AMERICAN: 49 mL/min — AB (ref >60)
GLUCOSE: 93 mg/dL (ref 65–99)
Glucose, Bld: 268 mg/dL — ABNORMAL HIGH (ref 65–99)
Potassium: 3.5 mmol/L (ref 3.5–5.1)
Potassium: 3.8 mmol/L (ref 3.5–5.1)
Sodium: 140 mmol/L (ref 135–145)
Sodium: 144 mmol/L (ref 135–145)

## 2015-07-29 LAB — GLUCOSE, CAPILLARY
GLUCOSE-CAPILLARY: 401 mg/dL — AB (ref 65–99)
Glucose-Capillary: 150 mg/dL — ABNORMAL HIGH (ref 65–99)
Glucose-Capillary: 255 mg/dL — ABNORMAL HIGH (ref 65–99)
Glucose-Capillary: 258 mg/dL — ABNORMAL HIGH (ref 65–99)
Glucose-Capillary: 353 mg/dL — ABNORMAL HIGH (ref 65–99)

## 2015-07-29 LAB — BLOOD GAS, ARTERIAL
ACID-BASE DEFICIT: 0.7 mmol/L (ref 0.0–2.0)
BICARBONATE: 23.7 meq/L (ref 20.0–24.0)
FIO2: 0.4
LHR: 26 {breaths}/min
O2 Saturation: 97.2 %
PEEP: 5 cmH2O
Patient temperature: 98.6
TCO2: 25 mmol/L (ref 0–100)
VT: 450 mL
pCO2 arterial: 40.9 mmHg (ref 35.0–45.0)
pH, Arterial: 7.381 (ref 7.350–7.450)
pO2, Arterial: 97.8 mmHg (ref 80.0–100.0)

## 2015-07-29 LAB — BONE MARROW EXAM

## 2015-07-29 LAB — PHOSPHORUS
PHOSPHORUS: 2.8 mg/dL (ref 2.5–4.6)
Phosphorus: 1.9 mg/dL — ABNORMAL LOW (ref 2.5–4.6)

## 2015-07-29 MED ORDER — INSULIN ASPART 100 UNIT/ML ~~LOC~~ SOLN
2.0000 [IU] | Freq: Four times a day (QID) | SUBCUTANEOUS | Status: DC
  Administered 2015-07-29 – 2015-08-02 (×15): 2 [IU] via SUBCUTANEOUS

## 2015-07-29 MED ORDER — FUROSEMIDE 10 MG/ML IJ SOLN
40.0000 mg | Freq: Four times a day (QID) | INTRAMUSCULAR | Status: AC
  Administered 2015-07-29 (×3): 40 mg via INTRAVENOUS
  Filled 2015-07-29 (×4): qty 4

## 2015-07-29 MED ORDER — POTASSIUM PHOSPHATES 15 MMOLE/5ML IV SOLN
30.0000 mmol | Freq: Once | INTRAVENOUS | Status: AC
  Administered 2015-07-29: 30 mmol via INTRAVENOUS
  Filled 2015-07-29: qty 10

## 2015-07-29 MED ORDER — MAGNESIUM SULFATE 50 % IJ SOLN
3.0000 g | Freq: Once | INTRAVENOUS | Status: AC
  Administered 2015-07-29: 3 g via INTRAVENOUS
  Filled 2015-07-29: qty 6

## 2015-07-29 MED ORDER — POTASSIUM CHLORIDE 20 MEQ/15ML (10%) PO SOLN
40.0000 meq | Freq: Three times a day (TID) | ORAL | Status: AC
  Administered 2015-07-29: 40 meq
  Filled 2015-07-29 (×2): qty 30

## 2015-07-29 MED ORDER — INSULIN GLARGINE 100 UNIT/ML ~~LOC~~ SOLN
15.0000 [IU] | Freq: Every day | SUBCUTANEOUS | Status: DC
  Administered 2015-07-29 – 2015-08-02 (×5): 15 [IU] via SUBCUTANEOUS
  Filled 2015-07-29 (×5): qty 0.15

## 2015-07-29 NOTE — Progress Notes (Signed)
Inpatient Diabetes Program Recommendations  AACE/ADA: New Consensus Statement on Inpatient Glycemic Control (2015)  Target Ranges:  Prepandial:   less than 140 mg/dL      Peak postprandial:   less than 180 mg/dL (1-2 hours)      Critically ill patients:  140 - 180 mg/dL   Lab Results  Component Value Date   GLUCAP 353* 07/29/2015   HGBA1C 7.4* 12/02/2014    Review of Glycemic Control:  Results for KOI, EICHENBERG (MRN [id number]) as of 07/29/2015 09:45  Ref. Range 07/28/2015 16:05 07/28/2015 20:25 07/28/2015 23:31 07/29/2015 04:41 07/29/2015 05:59  Glucose-Capillary Latest Ref Range: 65-99 mg/dL 183 (H) 280 (H) 258 (H) 401 (H) 353 (H)   Diabetes history: Type 2 diabetes Outpatient Diabetes medications: Metformin 500 mg bid, Glipizide 5 mg daily Current orders for Inpatient glycemic control:  Novolog resistant q 4 hours, Lantus 15 units daily, Novolog 2 units q 6 hours  Inpatient Diabetes Program Recommendations:    Consider IV insulin-ICU glycemic control protocol.  Thanks, Adah Perl, RN, BC-ADM Inpatient Diabetes Coordinator Pager [phone] (8a-5p)

## 2015-07-29 NOTE — Progress Notes (Signed)
[place] Progress Note Patient Name: Arthur Evans. DOB: 1946/01/15 MRN: [id number]   Date of Service  07/29/2015  HPI/Events of Note  Hypomagnesemia 1.5 & hypophosphatemia 1.5.   eICU Interventions  1. Magnesium sulfate 3 g IV 2. K-Phos 30 mmol IV  3. Repeat electrolytes in a.m.      Intervention Category Major Interventions: Electrolyte abnormality - evaluation and management  Tera Partridge 07/29/2015, 12:52 AM

## 2015-07-29 NOTE — Care Management Important Message (Signed)
Important Message  Patient Details  Name: Arthur Evans. MRN: [id number] Date of Birth: 05/11/1945   Medicare Important Message Given:  Yes    Nathen May 07/29/2015, 10:24 AM

## 2015-07-29 NOTE — Progress Notes (Signed)
[place] Progress Note Patient Name: Arthur Evans. DOB: [birth year] MRN: [id number]   Date of Service  07/29/2015  HPI/Events of Note  Extubated. Able to tolerate water without problems.  eICU Interventions  Will order: 1. D/C NPO. 2. Clear liquid diet. Carb modified.      Intervention Category Minor Interventions: Routine modifications to care plan (e.g. PRN medications for pain, fever)  Sommer,Steven Eugene 07/29/2015, 6:36 PM

## 2015-07-29 NOTE — Progress Notes (Signed)
PULMONARY / CRITICAL CARE MEDICINE   Name: Arthur Evans. MRN: [id number] DOB: 02-10-1945    ADMISSION DATE:  07/28/2015 CONSULTATION DATE:  6/19  REFERRING MD:  Dr. Jeanell Sparrow EDP  CHIEF COMPLAINT:  AMS  HISTORY OF PRESENT ILLNESS:  70 year old male with PMH as below, which includes AAA, DM on metformin, MI, DVT, and seizure disorder (thought due to hypoglycemia.  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 He presented to [facility] ED 6/19 with complaints of generalized weakness for 4 days. In ED he was found to be confused, febrile to 102.3 and hypotensive. He denied and symptoms of GI bleed, but hemoglobin was found to be 4.3. He required intubation for airway protection. He was transfused 2 units of emergent release blood and PCCM was called for admission.   SUBJECTIVE:  Bone marrow biopsy done this AM, patient remains sedate.  VITAL SIGNS: BP 136/54 mmHg  Pulse 102  Temp(Src) 99 F (37.2 C) (Oral)  Resp 16  Ht '5\' 8"'$  (1.727 m)  Wt 58.7 kg (129 lb 6.6 oz)  BMI 19.68 kg/m2  SpO2 99%  HEMODYNAMICS:    VENTILATOR SETTINGS: Vent Mode:  [-] PRVC FiO2 (%):  [50 %-60 %] 50 % Set Rate:  [26 bmp] 26 bmp Vt Set:  [450 mL] 450 mL PEEP:  [10 cmH20] 10 cmH20 Plateau Pressure:  [23 cmH20-29 cmH20] 29 cmH20  INTAKE / OUTPUT: I/O last 3 completed shifts: In: 3887.3 [I.V.:1665.3; NG/GT:1147; IV [id number]:6629] Out: 4765 [Urine:1185]  PHYSICAL EXAMINATION: General: elderly male in NAD intubated Neuro: RASS -2 sedate post bone marrow biopsy this AM HEENT: [place]/AT, PERRL 38m, pale conjunctiva Cardiovascular: RRR, no MRG, +2 pulses, no edema Lungs: Labored breathing when switched to wean this AM Abdomen: Soft, non-distended, BS+ Musculoskeletal: No acute deformity or ROM limitation. Skin: Grossly intact, skin warm and  dry.  LABS:  BMET  Recent Labs Lab 07/26/15 0217 07/27/15 0843 07/28/15 0336  NA 135 139 138  K 3.8 3.4* 3.1*  CL 106 106 104  CO2 18* 21* 23  BUN 28* 29* 34*  CREATININE 1.64* 1.65* 1.59*  GLUCOSE 273* 127* 156*   Electrolytes  Recent Labs Lab 07/26/15 0217  07/27/15 0843 07/28/15 0336 07/28/15 1036 07/28/15 1715  CALCIUM 6.9*  --  7.8* 7.7*  --   --   MG 1.5*  < >  --  2.1 2.2 1.5*  PHOS 3.5  < >  --  2.6 2.2* 1.5*  < > = values in this interval not displayed.  CBC  Recent Labs Lab 07/27/15 0436 07/27/15 0843 07/28/15 0336  WBC 3.2* 2.2* 2.9*  HGB 10.8* 12.5* 11.2*  HCT 31.9* 37.4* 34.2*  PLT 16* 22* 20*   Coag's  Recent Labs Lab 07/26/15 0217 07/28/15 0336  APTT  --  32  INR 1.85*  --    Sepsis Markers  Recent Labs Lab 07/08/2015 2032 07/29/2015 2231 07/26/15 0217 07/26/15 1508 07/27/15 0436 07/28/15 0336  LATICACIDVEN 4.88* 6.62* 3.7*  --   --   --   PROCALCITON  --   --   --  5.09 4.33 3.71   ABG  Recent Labs Lab 08/02/2015 2228 07/26/15 0432 07/28/15 1132  PHART 7.169* 7.420 7.385  PCO2ART 36.9 28.8* 40.3  PO2ART 129.0* 57.9* 80.5   Liver Enzymes  Recent Labs Lab  07/26/2015 2018 07/27/15 0843 07/28/15 0336  AST 24 <5* 100*  ALT 15* 442* 247*  ALKPHOS 32* 52 41  BILITOT 1.0 0.9 1.7*  ALBUMIN 2.3* 2.3* 1.9*   Cardiac Enzymes  Recent Labs Lab 07/27/15 0843  TROPONINI 0.23*   Glucose  Recent Labs Lab 07/28/15 1201 07/28/15 1605 07/28/15 2025 07/28/15 2331 07/29/15 0441 07/29/15 0559  GLUCAP 149* 183* 280* 258* 401* 353*   Imaging Ct Chest Wo Contrast  07/28/2015  CLINICAL DATA:  Acute respiratory failure.  Inpatient. EXAM: CT CHEST WITHOUT CONTRAST TECHNIQUE: Multidetector CT imaging of the chest was performed following the standard protocol without IV contrast. COMPARISON:  Chest radiograph from earlier today. FINDINGS: Mediastinum/Nodes: Normal heart size. Trace pericardial fluid/ thickening. Left main, left  anterior descending, left circumflex and right coronary atherosclerosis. Atherosclerotic nonaneurysmal thoracic aorta. Normal caliber pulmonary arteries. Coarse calcifications in the left thyroid lobe measuring up to 0.4 cm. Enteric tube enters the stomach with the tip not seen on this image. No appreciable esophageal wall thickening. No axillary adenopathy. Enlarged 1.5 cm right lower paratracheal node (series 201/ image 51). Mildly enlarged 1.0 cm subcarinal node (series 201/ image 60). No additional pathologically enlarged mediastinal or gross hilar nodes on this noncontrast study. Lungs/Pleura: No pneumothorax. Small layering bilateral pleural effusions, right greater than left. No appreciable pleural thickening or nodularity. Endotracheal tube tip is 4.7 cm above the carina. There is extensive patchy ground-glass opacity and consolidation throughout both lungs involving all lung lobes, most prominent in the right middle and right lower lobes. Underlying pulmonary nodules cannot be excluded. Upper abdomen: Partially visualized infrarenal abdominal aortic aneurysm measuring at least 4.2 cm, not appreciably changed since 07/07/2015 CT abdomen/pelvis. Musculoskeletal: No aggressive appearing focal osseous lesions. Mild compression deformities of the T8 and T9 vertebral bodies, which appear chronic. Stable chronic healed deformity of the L1 vertebral body. Mild thoracic spondylosis. Partially visualized bilateral posterior spinal fusion hardware extending inferiorly from the T9 level. IMPRESSION: 1. Well-positioned support structures as described. 2. Extensive patchy ground-glass opacity and consolidation throughout both lungs involving all lung lobes, most prominent in the right middle and right lower lobes, most suggestive of a multilobar infectious pneumonia, with the differential including diffuse alveolar hemorrhage and acute interstitial pneumonia (AIP)/ARDS. A follow-up post treatment chest CT is recommended  in 3 months to document resolution of these findings. 3. Mild mediastinal lymphadenopathy, nonspecific, probably reactive. 4. Small layering bilateral pleural effusions, right greater than left. 5. Left main and 3 vessel coronary atherosclerosis. 6. Partially visualized 4.2 cm infrarenal abdominal aortic aneurysm. Recommend followup by ultrasound in 1 year. This recommendation follows ACR consensus guidelines: White Paper of the ACR Incidental Findings Committee II on Vascular Findings. J Am Coll Radiol 2013; 10:789-794. Electronically Signed   By: Delbert Phenix M.D.   On: 07/28/2015 13:57   Dg Chest Port 1 View  07/29/2015  CLINICAL DATA:  Respiratory failure. EXAM: PORTABLE CHEST 1 VIEW COMPARISON:  CT 07/28/2015.  Chest x-ray 07/28/2015. FINDINGS: Endotracheal tube, NG tube in stable position. Heart size stable. Diffuse right lung infiltrate again noted. Mild left lower lobe infiltrate cannot be excluded. No pleural effusion or pneumothorax. Prior thoracolumbar spine fusion. IMPRESSION: 1.  Lines and tubes in stable position. 2. Diffuse right lung infiltrate. No interim change. Mild left lower lobe infiltrate cannot be excluded. Electronically Signed   By: Maisie Fus  Register   On: 07/29/2015 08:05   Dg Abd Portable 1v  07/28/2015  CLINICAL DATA:  Encounter for orogastric tube placement.  EXAM: PORTABLE ABDOMEN - 1 VIEW COMPARISON:  07/28/2015 FINDINGS: There has been advancement of the OG tube. The tip is in the body of stomach. The side port of the OG tube is now all approximately 11 cm below the GE junction. Persistent airspace opacification involving the right lung. IMPRESSION: 1. Interval advancement of the OG tube with side port well below the level of the GE junction. Electronically Signed   By: Kerby Moors M.D.   On: 07/28/2015 10:32   Dg Abd Portable 1v  07/28/2015  CLINICAL DATA:  Assess OG tube placement EXAM: PORTABLE ABDOMEN - 1 VIEW COMPARISON:  08/03/2015 FINDINGS: Orogastric tube tip is in  the projection of the expected location body of stomach. Side port is below the level of the GE junction. Diffuse airspace opacities are identified within the right lung. The bowel gas pattern appears nonobstructive. IMPRESSION: 1. Side port of the OG tube is below the level of the GE junction. Electronically Signed   By: Kerby Moors M.D.   On: 07/28/2015 09:41   CXR Images personally reviewed, increased density / opacity in RLL  STUDIES:  CT abd 6/19 >> patchy bibasilar airspace opacities, most prominent in RML, small amt ascites within the abd / pelvis, aneurysmal dilation of the infrarenal abdominal aorta (unchanged from 2015), vague soft tissue edema within the upper pelvis, tracking about the small bowel loops, diffuse CAD, enlarged prostate  ECHO 6/20 >> LV with mild concentric hypertrophy, LVEF 45-50%, diffuse hypokinesis, grade 2 diastolic dysfunction, PA peak 38  CULTURES: BCx2 6/19 >> Urine 6/19 >> negative U. Legionella 6/20 >> negative  RMSF 6/20 >>  PCP DFA 6/21 >> negative  BAL 6/21 >>  HIV 6/21 >> neg  HEMATOLOGY: Folate >> 1164 B12 >> 998 Reticulocyte >> 32.1, 15.9 Peripheral Smear >> pancytopenia  RMSF >>  ANTIBIOTICS: Ceftriaxone 6/19 >> Azithromycin 6/19 >> 6/20 Doxycycline 6/20 >>   SIGNIFICANT EVENTS: 6/19  admit with fever, ams, pancytopenia.  Transfused, intubated 6/20  Extubated.  6/21  Developed resp distress early am, lasix + biPAP.  Failed, reintubated   LINES/TUBES: ETT 6/19 >> 6/20>>>6/21>>>  DISCUSSION: 71 year old male presented with profound anemia, passed out, PNA, required intubation. Found to have pancytopenia requiring 4 units PRBC. Remained hypotensive. Concern septic shock. Stabilize on vent with CAP coverage.  Serial CBC and transfusion as needed. Nebs and steroids for COPD exacerbation. CT abd pending.   ASSESSMENT / PLAN:  PULMONARY A: Acute Respiratory Distress - early am 6/21, initiated bipap RLL PNA Inability to protect  airway in setting AMS - resolved, extubated 6/20.  Failed 6/21, reintubated.   COPD with acute exacerbation Tobacco Abuse - smoked since age 53, heaviest up to 1.5 ppd, now 5-6 cigs per day Possible CAP vs aspiration - appears rounded on CXR, more infiltrative on CT abd/pelvis L lung nodule from prior CT abdomen P:   PRVC 8 cc/kg  Wean PEEP / FiO2 for sats > 88% ABG now. Pulmonary hygiene - IS, mobilize ABG and CXR in AM Scheduled Duonebs, PRN albuterol Continue Solumedrol '60mg'$  q12  Consider non-contrast CT chest to evaluate RLL  CARDIOVASCULAR A:  Shock - resolved. Suspect septic vs hypovolmia/hemmorhagic as well. S/P 3 units PRBC's, 5L NS Chronic diastolic CHF (Grade 1 DD) AAA 3.88x3.81 CM - unchanged from 2015 H/o CAD P:  Telemetry monitoring MAP goal > 41mHg  RENAL A:   AKI Pseudohypocalcemia (corrects to 9.1) AGMA - lactic, clearing  P:   Follow BMP / UOP  Correct electrolytes as indicated NS to [facility] Fluid challenge with decreased UOP > 500 ml bolus, then 50 ml/hr  GASTROINTESTINAL A:   Ascites - small amt noted on CT, ? Inflammatory vs edema.  Acute hepatitis panel negative, LDH 1455 Protein Calorie Malnutrition P:   Consult nutrition for TF as per nutrition. Insert OGT  Pepcid for SUP Trend LDH  HEMATOLOGIC A:   Severe pancytopenia - etiology unclear, could be due to profound infection, has had tick bites in recent week (no rash on exam), concern for malignancy with lab disturbances (decreased appetite, mild weight loss).  S/P 4 units PRBC 6/19 Elevated LDH / anemia, query intravascular hemolysis, MDS??   P:  PT/INR Follow up folic acic, peripheral smear. Hematology/Oncology consult appreciated Trend CBC Assess amylase/lipase  F/U on bone marrow.  INFECTIOUS A:   SIRS/Sepsis ? CAP vs aspiration P:   ABX as above. Trend PCT. Monitor WBC and fever curves.  ENDOCRINE A:   DM P:   Hold metformin, glipizide. CBG monitoring and  SSI.  NEUROLOGIC A:   Acute metabolic encephalopathy - resolved P:   Minimize sedating medications. PT efforts, mobilize.  FAMILY  - Updates:  No family bedside 6/23.  - Inter-disciplinary family meet or Palliative Care meeting due by:  6/19  The patient is critically ill with multiple organ systems failure and requires high complexity decision making for assessment and support, frequent evaluation and titration of therapies, application of advanced monitoring technologies and extensive interpretation of multiple databases.   Critical Care Time devoted to patient care services described in this note is 35 Minutes. This time reflects time of care of this signee Dr [name]. This critical care time does not reflect procedure time, or teaching time or supervisory time of PA/NP/Med student/Med Resident etc but could involve care discussion time.  Rush Farmer, M.D. [facility] Pulmonary/Critical Care Medicine. Pager: [phone]. After hours pager: [phone].

## 2015-07-29 NOTE — Progress Notes (Signed)
[name] Progress Note Patient Name: Arthur Evans. DOB: 1945-09-30 MRN: [id number]   Date of Service  07/29/2015  HPI/Events of Note  Patient has K+ PO ordered, however, patient is NPO.   eICU Interventions  Will recheck BMP prior to further K+ replacement.      Intervention Category Intermediate Interventions: Electrolyte abnormality - evaluation and management  Meryl Ponder Eugene 07/29/2015, 5:02 PM

## 2015-07-29 NOTE — Progress Notes (Signed)
The patient has pancytopenia and is currently in the ICU ventilated and sedated. The role for bone marrow aspirate and biopsy for evaluation of pancytopenia was discussed with primary service. Consent was obtained yesterday by primary service.   Bone Marrow Biopsy and Aspiration Procedure Note   Informed consent was obtained and potential risks including bleeding, infection and pain were reviewed with the patient. The patient's name, date of birth, identification, consent and allergies were verified prior to the start of procedure and time out was performed.  The right posterior iliac crest was chosen as the site of biopsy.  The skin was prepped with Betadine solution.   3 cc of 1% lidocaine was used to provide local anaesthesia.   10 cc of bone marrow aspirate was obtained followed by 1 inch biopsy.   The procedure was tolerated well and there were no complications.  The patient was stable at the end of the procedure.  Specimens sent for flow cytometry, cytogenetics and additional studies.  I expect the results will be available next week either Monday or Tuesday. I will return to see the patient once results are available  [facility], Arthur Schrupp, MD 07/29/2015

## 2015-07-29 NOTE — Procedures (Signed)
Extubation Procedure Note  Patient Details:   Name: Arthur Evans. DOB: May 09, 1945 MRN: [id number]   Airway Documentation:     Evaluation  O2 sats: stable throughout Complications: No apparent complications Patient did tolerate procedure well. Bilateral Breath Sounds: Rhonchi   Yes   Pt extubated and placed on 4L [place], however spo2 decreased to 86%, pt then placed on 5L. Pt able to speak and has a weak, productive cough. Pt stable at this time. RN and MD aware. Rt will cont to monitor.   Ellan Lambert 07/29/2015, 12:55 PM

## 2015-07-29 NOTE — Procedures (Signed)
Bone Marrow Biopsy and Aspiration Procedure Note   Informed consent was obtained and potential risks including bleeding, infection and pain were reviewed with the patient's family. The patient's name, date of birth, identification, consent and allergies were verified prior to the start of procedure and time out was performed.  The right posterior iliac crest was chosen as the site of biopsy.  The skin was prepped with Betadine solution.   8 cc of 1% lidocaine was used to provide local anaesthesia.   10 cc of bone marrow aspirate was obtained followed by 1 inch biopsy.   The procedure was tolerated well and there were no complications.  The patient was stable at the end of the procedure.  Specimens sent for flow cytometry, cytogenetics and additional studies.

## 2015-07-29 NOTE — Progress Notes (Signed)
Pt became extremely agitated asking for water despite being told that it was not yet four hours post extubation. Pt was able to communicate his name, location, and the date however was unclear of his admission history (being intubated and extubated multiple times). Pt stated that he would leave the unit and that he did not need oxygen. Security was called for patient safety. Security spoke with patient after which he calmed down. Will continue to monitor for delirium and reorient pt as needed.

## 2015-07-29 NOTE — Care Management Note (Signed)
Case Management Note  Patient Details  Name: Arthur Evans. MRN: [id number] Date of Birth: 1945/08/10  Subjective/Objective:  Pt admitted with anemia, fatigue and altered mental status  - intubated                Action/Plan:   Pt is independent from home alone.  Son at bedside.  CM will continue to follow for discharge needs   Expected Discharge Date:                  Expected Discharge Plan:  [place]  In-House Referral:     Discharge planning Services  CM Consult  Post Acute Care Choice:    Choice offered to:     DME Arranged:    DME Agency:     HH Arranged:    HH Agency:     Status of Service:  In process, will continue to follow  Medicare Important Message Given:  Yes Date Medicare IM Given:    Medicare IM give by:    Date Additional Medicare IM Given:    Additional Medicare Important Message give by:     If discussed at [place] of Stay Meetings, dates discussed:    Additional Comments: 07/29/2015 Pt extubated today.  PT evaluation ordered. Maryclare Labrador, RN 07/29/2015, 3:45 PM

## 2015-07-30 ENCOUNTER — Inpatient Hospital Stay (HOSPITAL_COMMUNITY): Payer: Commercial Managed Care - HMO

## 2015-07-30 DIAGNOSIS — J9621 Acute and chronic respiratory failure with hypoxia: Secondary | ICD-10-CM

## 2015-07-30 LAB — BASIC METABOLIC PANEL
ANION GAP: 11 (ref 5–15)
BUN: 40 mg/dL — ABNORMAL HIGH (ref 6–20)
CALCIUM: 7.9 mg/dL — AB (ref 8.9–10.3)
CO2: 28 mmol/L (ref 22–32)
Chloride: 103 mmol/L (ref 101–111)
Creatinine, Ser: 1.38 mg/dL — ABNORMAL HIGH (ref 0.61–1.24)
GFR, EST AFRICAN AMERICAN: 58 mL/min — AB (ref >60)
GFR, EST NON AFRICAN AMERICAN: 50 mL/min — AB (ref >60)
Glucose, Bld: 172 mg/dL — ABNORMAL HIGH (ref 65–99)
POTASSIUM: 3.7 mmol/L (ref 3.5–5.1)
SODIUM: 142 mmol/L (ref 135–145)

## 2015-07-30 LAB — BLOOD GAS, ARTERIAL
ACID-BASE EXCESS: 6.6 mmol/L — AB (ref 0.0–2.0)
Acid-Base Excess: 6.8 mmol/L — ABNORMAL HIGH (ref 0.0–2.0)
BICARBONATE: 30.5 meq/L — AB (ref 20.0–24.0)
Bicarbonate: 30.4 mEq/L — ABNORMAL HIGH (ref 20.0–24.0)
DRAWN BY: 44138
Drawn by: 365271
FIO2: 0.5
O2 CONTENT: 3 L/min
O2 SAT: 98.2 %
O2 Saturation: 90.1 %
PATIENT TEMPERATURE: 98.6
PATIENT TEMPERATURE: 99.8
PCO2 ART: 42.3 mmHg (ref 35.0–45.0)
PCO2 ART: 42.7 mmHg (ref 35.0–45.0)
PEEP: 5 cmH2O
PH ART: 7.47 — AB (ref 7.350–7.450)
PO2 ART: 57.4 mmHg — AB (ref 80.0–100.0)
PO2 ART: 119 mmHg — AB (ref 80.0–100.0)
RATE: 18 resp/min
TCO2: 31.7 mmol/L (ref 0–100)
TCO2: 31.8 mmol/L (ref 0–100)
VT: 550 mL
pH, Arterial: 7.471 — ABNORMAL HIGH (ref 7.350–7.450)

## 2015-07-30 LAB — MAGNESIUM: MAGNESIUM: 2.2 mg/dL (ref 1.7–2.4)

## 2015-07-30 LAB — CULTURE, BAL-QUANTITATIVE W GRAM STAIN: Culture: NO GROWTH

## 2015-07-30 LAB — BODY FLUID CELL COUNT WITH DIFFERENTIAL
Eos, Fluid: 0 %
Lymphs, Fluid: 25 %
Monocyte-Macrophage-Serous Fluid: 69 % (ref 50–90)
Neutrophil Count, Fluid: 6 % (ref 0–25)
Total Nucleated Cell Count, Fluid: 194 cu mm (ref 0–1000)

## 2015-07-30 LAB — GLUCOSE, CAPILLARY
GLUCOSE-CAPILLARY: 190 mg/dL — AB (ref 65–99)
GLUCOSE-CAPILLARY: 85 mg/dL (ref 65–99)
Glucose-Capillary: 115 mg/dL — ABNORMAL HIGH (ref 65–99)
Glucose-Capillary: 143 mg/dL — ABNORMAL HIGH (ref 65–99)
Glucose-Capillary: 146 mg/dL — ABNORMAL HIGH (ref 65–99)
Glucose-Capillary: 151 mg/dL — ABNORMAL HIGH (ref 65–99)
Glucose-Capillary: 196 mg/dL — ABNORMAL HIGH (ref 65–99)
Glucose-Capillary: 50 mg/dL — ABNORMAL LOW (ref 65–99)

## 2015-07-30 LAB — CBC
HCT: 29.9 % — ABNORMAL LOW (ref 39.0–52.0)
Hemoglobin: 9.8 g/dL — ABNORMAL LOW (ref 13.0–17.0)
MCH: 29 pg (ref 26.0–34.0)
MCHC: 32.8 g/dL (ref 30.0–36.0)
MCV: 88.5 fL (ref 78.0–100.0)
PLATELETS: 12 10*3/uL — AB (ref 150–400)
RBC: 3.38 MIL/uL — AB (ref 4.22–5.81)
RDW: 20 % — ABNORMAL HIGH (ref 11.5–15.5)
WBC: 7.1 10*3/uL (ref 4.0–10.5)

## 2015-07-30 LAB — TRIGLYCERIDES: Triglycerides: 113 mg/dL (ref <150)

## 2015-07-30 LAB — TYPE AND SCREEN
ABO/RH(D): O POS
ANTIBODY SCREEN: NEGATIVE

## 2015-07-30 LAB — CULTURE, BLOOD (ROUTINE X 2)
CULTURE: NO GROWTH
Culture: NO GROWTH

## 2015-07-30 LAB — CULTURE, BAL-QUANTITATIVE

## 2015-07-30 LAB — PHOSPHORUS: PHOSPHORUS: 2.5 mg/dL (ref 2.5–4.6)

## 2015-07-30 MED ORDER — ETOMIDATE 2 MG/ML IV SOLN
20.0000 mg | Freq: Once | INTRAVENOUS | Status: AC
  Administered 2015-07-30: 20 mg via INTRAVENOUS

## 2015-07-30 MED ORDER — PROPOFOL 1000 MG/100ML IV EMUL
INTRAVENOUS | Status: AC
  Administered 2015-07-30: 40 ug/kg/min via INTRAVENOUS
  Filled 2015-07-30: qty 100

## 2015-07-30 MED ORDER — FENTANYL CITRATE (PF) 100 MCG/2ML IJ SOLN
INTRAMUSCULAR | Status: AC
  Administered 2015-07-30: 50 ug via INTRAVENOUS
  Filled 2015-07-30: qty 2

## 2015-07-30 MED ORDER — FUROSEMIDE 10 MG/ML IJ SOLN
40.0000 mg | Freq: Once | INTRAMUSCULAR | Status: AC
Start: 2015-07-30 — End: 2015-07-30
  Administered 2015-07-30: 40 mg via INTRAVENOUS
  Filled 2015-07-30: qty 4

## 2015-07-30 MED ORDER — SUCCINYLCHOLINE CHLORIDE 20 MG/ML IJ SOLN
100.0000 mg | Freq: Once | INTRAMUSCULAR | Status: AC
  Administered 2015-07-30: 100 mg via INTRAVENOUS

## 2015-07-30 MED ORDER — FENTANYL BOLUS VIA INFUSION
25.0000 ug | INTRAVENOUS | Status: DC | PRN
  Administered 2015-08-01 – 2015-08-02 (×2): 25 ug via INTRAVENOUS
  Filled 2015-07-30: qty 25

## 2015-07-30 MED ORDER — LORAZEPAM 2 MG/ML IJ SOLN
INTRAMUSCULAR | Status: AC
  Filled 2015-07-30: qty 1

## 2015-07-30 MED ORDER — BISACODYL 10 MG RE SUPP: 10.0000 mg | Freq: Every day | RECTAL | Status: DC | PRN

## 2015-07-30 MED ORDER — LORAZEPAM 2 MG/ML IJ SOLN
0.5000 mg | Freq: Once | INTRAMUSCULAR | Status: AC
  Administered 2015-07-30: 0.5 mg via INTRAVENOUS

## 2015-07-30 MED ORDER — DOXYCYCLINE HYCLATE 100 MG IV SOLR
100.0000 mg | Freq: Two times a day (BID) | INTRAVENOUS | Status: DC
  Administered 2015-07-30 – 2015-08-01 (×5): 100 mg via INTRAVENOUS
  Filled 2015-07-30 (×7): qty 100

## 2015-07-30 MED ORDER — IBUPROFEN 200 MG PO TABS: 400.0000 mg | ORAL_TABLET | Freq: Four times a day (QID) | ORAL | Status: DC | PRN

## 2015-07-30 MED ORDER — SODIUM CHLORIDE 0.9 % IV SOLN
25.0000 ug/h | INTRAVENOUS | Status: DC
  Administered 2015-07-30: 100 ug/h via INTRAVENOUS
  Administered 2015-08-01 – 2015-08-02 (×2): 75 ug/h via INTRAVENOUS
  Filled 2015-07-30 (×3): qty 50

## 2015-07-30 MED ORDER — DEXTROSE 50 % IV SOLN
INTRAVENOUS | Status: AC
  Administered 2015-07-30: 25 mL
  Filled 2015-07-30: qty 50

## 2015-07-30 MED ORDER — IPRATROPIUM-ALBUTEROL 0.5-2.5 (3) MG/3ML IN SOLN
3.0000 mL | Freq: Three times a day (TID) | RESPIRATORY_TRACT | Status: DC
  Administered 2015-07-30 – 2015-08-02 (×10): 3 mL via RESPIRATORY_TRACT
  Filled 2015-07-30 (×10): qty 3

## 2015-07-30 MED ORDER — LIDOCAINE HCL (PF) 1 % IJ SOLN
INTRAMUSCULAR | Status: AC
  Administered 2015-07-30: 30 mL
  Filled 2015-07-30: qty 30

## 2015-07-30 MED ORDER — SENNOSIDES 8.8 MG/5ML PO SYRP
5.0000 mL | ORAL_SOLUTION | Freq: Two times a day (BID) | ORAL | Status: DC | PRN
  Administered 2015-07-31 – 2015-08-01 (×2): 5 mL
  Filled 2015-07-30 (×3): qty 5

## 2015-07-30 MED ORDER — MIDAZOLAM HCL 2 MG/2ML IJ SOLN
INTRAMUSCULAR | Status: DC
Start: 2015-07-30 — End: 2015-07-30
  Filled 2015-07-30: qty 2

## 2015-07-30 MED ORDER — FENTANYL CITRATE (PF) 100 MCG/2ML IJ SOLN
50.0000 ug | Freq: Once | INTRAMUSCULAR | Status: AC
  Administered 2015-07-30: 50 ug via INTRAVENOUS

## 2015-07-30 MED ORDER — POTASSIUM CHLORIDE 10 MEQ/100ML IV SOLN
10.0000 meq | INTRAVENOUS | Status: AC
  Administered 2015-07-30 (×3): 10 meq via INTRAVENOUS
  Filled 2015-07-30 (×3): qty 100

## 2015-07-30 MED ORDER — ANTISEPTIC ORAL RINSE SOLUTION (CORINZ)
7.0000 mL | Freq: Four times a day (QID) | OROMUCOSAL | Status: DC
  Administered 2015-07-31 – 2015-08-03 (×10): 7 mL via OROMUCOSAL

## 2015-07-30 MED ORDER — SODIUM CHLORIDE 0.9 % IV SOLN
Freq: Once | INTRAVENOUS | Status: AC
  Administered 2015-07-30: 21:00:00 via INTRAVENOUS

## 2015-07-30 MED ORDER — PROPOFOL 1000 MG/100ML IV EMUL
0.0000 ug/kg/min | INTRAVENOUS | Status: DC
  Administered 2015-07-30 – 2015-07-31 (×2): 40 ug/kg/min via INTRAVENOUS
  Administered 2015-07-31: 20 ug/kg/min via INTRAVENOUS
  Administered 2015-07-31 – 2015-08-01 (×4): 30 ug/kg/min via INTRAVENOUS
  Administered 2015-08-02: 35 ug/kg/min via INTRAVENOUS
  Filled 2015-07-30 (×8): qty 100

## 2015-07-30 MED ORDER — LIDOCAINE HCL (PF) 1 % IJ SOLN
30.0000 mL | Freq: Once | INTRAMUSCULAR | Status: AC
  Administered 2015-07-30: 30 mL

## 2015-07-30 NOTE — Progress Notes (Signed)
[facility] Progress Note Patient Name: Arthur Evans. DOB: May 21, 1945 MRN: [id number]   Date of Service  07/30/2015  HPI/Events of Note  Patient had run of wide-complex tachycardia. Potassium 3.7 this morning. Magnesium 2.2. Serum creatinine 1.38. Has peripheral IV access.   eICU Interventions  1. KCl 10 mEq IV 3 runs 2. Continue telemetry monitoring      Intervention Category Intermediate Interventions: Electrolyte abnormality - evaluation and management;Arrhythmia - evaluation and management  Tera Partridge 07/30/2015, 6:47 AM

## 2015-07-30 NOTE — Progress Notes (Signed)
[facility] Progress Note Patient Name: Arthur Evans. DOB: 1945-10-04 MRN: [id number]   Date of Service  07/30/2015  HPI/Events of Note  Multiple issues: 1. Patient wants to be full code after speaking with family and 2. Temp = 99.9 F. Already on Abx Rx. AST and ALT elevated. Creatinine = 1.30.  eICU Interventions  Will order: 1. Change code status to full code.  2. Motrin 400 mg PO Q 6 hours PRN Temp > 101.0 F.     Intervention Category Intermediate Interventions: Infection - evaluation and management Minor Interventions: Communication with other healthcare providers and/or family;Routine modifications to care plan (e.g. PRN medications for pain, fever)  Sommer,Steven Cornelia Copa 07/30/2015, 4:19 PM

## 2015-07-30 NOTE — Progress Notes (Signed)
Pt placed on BIPAP 12/6. Pt states he's feeling much better now that he's on BIPAP. Vitals are stable. RT will continue to monitor.

## 2015-07-30 NOTE — Progress Notes (Addendum)
PULMONARY / CRITICAL CARE MEDICINE   Name: Arthur Evans. MRN: [id number] DOB: 1945/07/10    ADMISSION DATE:  07/07/2015 CONSULTATION DATE:  6/19  REFERRING MD:  Dr. Jeanell Sparrow EDP  CHIEF COMPLAINT:  AMS  HISTORY OF PRESENT ILLNESS:  70 year old male with PMH as below, which includes AAA, DM on metformin, MI, DVT, and seizure disorder (thought due to hypoglycemia.  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 He presented to [facility] ED 6/19 with complaints of generalized weakness for 4 days. In ED he was found to be confused, febrile to 102.3 and hypotensive. He denied and symptoms of GI bleed, but hemoglobin was found to be 4.3. He required intubation for airway protection. He was transfused 2 units of emergent release blood and PCCM was called for admission.   SUBJECTIVE:   RN reports pt didn't sleep last night as he stated "I am afraid they will put that tube back in".  He requests that he not be reintubated.   VITAL SIGNS: BP 110/52 mmHg  Pulse 86  Temp(Src) 99.3 F (37.4 C) (Core (Comment))  Resp 26  Ht _0  (1.727 m)  Wt 128 lb 4.9 oz (58.2 kg)  BMI 19.51 kg/m2  SpO2 94%  HEMODYNAMICS:    VENTILATOR SETTINGS: Vent Mode:  [-] PRVC FiO2 (%):  [40 %] 40 % Set Rate:  [26 bmp] 26 bmp Vt Set:  [450 mL] 450 mL PEEP:  [5 cmH20] 5 cmH20 Plateau Pressure:  [18 cmH20] 18 cmH20  INTAKE / OUTPUT: I/O last 3 completed shifts: In: 2756.9 [I.V.:1086.9; NG/GT:1045; IV Piggyback:625] Out: 1007 [HQRFX:5883]  PHYSICAL EXAMINATION: General: elderly male in NAD  Neuro: awake, alert, speech clear HEENT: [place]/AT, PERRL 55m, pale conjunctiva Cardiovascular: RRR, no MRG, +2 pulses, no edema Lungs: mild tachypnea, no distress, lungs bilaterally coarse with wheezing Abdomen: Soft, non-distended, BS+ Musculoskeletal: No acute deformity or ROM  limitation. Skin: Grossly intact, skin warm and dry.  LABS:  BMET  Recent Labs Lab 07/29/15 0500 07/29/15 1845 07/30/15 0547  NA 140 144 142  K 3.5 3.8 3.7  CL 109 110 103  CO2 _1 BUN 39* 38* 40*  CREATININE 1.44* 1.40* 1.38*  GLUCOSE 268* 93 172*   Electrolytes  Recent Labs Lab 07/29/15 0500 07/29/15 1645 07/29/15 1845 07/30/15 0547  CALCIUM 7.9*  --  8.0* 7.9*  MG 3.0* 2.7*  --  2.2  PHOS 2.8 1.9*  --  2.5    CBC  Recent Labs Lab 07/28/15 0336 07/29/15 0500 07/30/15 0547  WBC 2.9* 4.8 7.1  HGB 11.2* 9.8* 9.8*  HCT 34.2* 29.6* 29.9*  PLT 20* 13* 12*   Coag's  Recent Labs Lab 07/26/15 0217 07/28/15 0336  APTT  --  32  INR 1.85*  --    Sepsis Markers  Recent Labs Lab 07/10/2015 2032 07/19/2015 2231 07/26/15 0217 07/26/15 1508 07/27/15 0436 07/28/15 0336  LATICACIDVEN 4.88* 6.62* 3.7*  --   --   --   PROCALCITON  --   --   --  5.09 4.33 3.71   ABG  Recent Labs Lab 07/28/15 1132 07/29/15 0939 07/30/15 0340  PHART 7.385 7.381 7.470*  PCO2ART 40.3 40.9 42.3  PO2ART 80.5 97.8 57.4*   Liver Enzymes  Recent Labs Lab 08/05/2015 2018 07/27/15 0843 07/28/15 0336  AST 24 <  5* 100*  ALT 15* 442* 247*  ALKPHOS 32* 52 41  BILITOT 1.0 0.9 1.7*  ALBUMIN 2.3* 2.3* 1.9*   Cardiac Enzymes  Recent Labs Lab 07/27/15 0843  TROPONINI 0.23*   Glucose  Recent Labs Lab 07/29/15 0559 07/29/15 1201 07/29/15 1544 07/29/15 2005 07/30/15 0012 07/30/15 0314  GLUCAP 353* 255* 115* 150* 151* 143*   Imaging No results found. CXR Images personally reviewed, increased density / opacity in RLL  STUDIES:  CT abd 6/19 >> patchy bibasilar airspace opacities, most prominent in RML, small amt ascites within the abd / pelvis, aneurysmal dilation of the infrarenal abdominal aorta (unchanged from 2015), vague soft tissue edema within the upper pelvis, tracking about the small bowel loops, diffuse CAD, enlarged prostate ECHO 6/20 >> LV with mild  concentric hypertrophy, LVEF 45-50%, diffuse hypokinesis, grade 2 diastolic dysfunction, PA peak 38 CT Chest 6/22 >> extensive patchy GGO and consolidation throughout both lungs, mild mediastinal LAN, small effusions Bone Marrow Biopsy 6/23 >>   CULTURES: BCx2 6/19 >> Urine 6/19 >> negative U. Legionella 6/20 >> negative  RMSF 6/20 >> POSITIVE IgG PCP DFA 6/21 >> negative  BAL 6/21 >>  HIV 6/21 >> neg  HEMATOLOGY: Folate >> 1164 B12 >> 998 Reticulocyte >> 32.1, 15.9 Peripheral Smear >> pancytopenia  RMSF >> IgG POSITIVE  ANTIBIOTICS: Ceftriaxone 6/19 >> Azithromycin 6/19 >> 6/20 Doxycycline 6/20 >>   SIGNIFICANT EVENTS: 6/19  admit with fever, ams, pancytopenia.  Transfused, intubated 6/20  Extubated.  6/21  Developed resp distress early am, lasix + biPAP.  Failed, reintubated  6/23  Bone arrow Bx  LINES/TUBES: ETT 6/19 >> 6/20, 6/21>> 6/23  DISCUSSION: 70 year old male presented with profound anemia, passed out, PNA, required intubation. Found to have pancytopenia requiring 4 units PRBC. Remained hypotensive. Concern septic shock. Stabilize on vent with CAP coverage.  Serial CBC and transfusion as needed. Nebs and steroids for COPD exacerbation.    ASSESSMENT / PLAN:  PULMONARY A: Acute Respiratory Distress - early am 6/21, initiated bipap Bilateral Ground Glass Opacities RLL PNA Inability to protect airway in setting AMS - resolved, extubated 6/20.  Failed 6/21, reintubated.   COPD with acute exacerbation Tobacco Abuse - smoked since age 68, heaviest up to 1.5 ppd, now 5-6 cigs per day Possible CAP vs aspiration - appears rounded on CXR, more infiltrative on CT abd/pelvis L lung nodule from prior CT abdomen P:   O2 for sats 88-95% Pulmonary hygiene - IS, mobilize Monitor CXR Scheduled Duonebs, PRN albuterol Continue Solumedrol 20m q12    CARDIOVASCULAR A:  Shock - resolved. Suspect septic vs hypovolmia/hemmorhagic as well. S/P 3 units PRBC's, 5L  NS Chronic diastolic CHF (Grade 1 DD) AAA 3.88x3.81 CM - unchanged from 2015 H/o CAD P:  Telemetry monitoring MAP goal > 652mg  RENAL A:   AKI - resolving Pseudohypocalcemia (corrects to 9.1) AGMA - lactic, clearing  P:   Follow BMP / UOP  Correct electrolytes as indicated NS to KVO  GASTROINTESTINAL A:   Ascites - small amt noted on CT, ? Inflammatory vs edema.  Acute hepatitis panel negative, LDH 1455 Protein Calorie Malnutrition P:   Heart healthy diet Pepcid for SUP, consider d/c 6/25 Trend LDH  HEMATOLOGIC A:   Severe pancytopenia - etiology unclear, could be due to profound infection, has had tick bites in recent week (no rash on exam), concern for malignancy with lab disturbances (decreased appetite, mild weight loss).  S/P 4 units PRBC 6/19 Elevated LDH /  anemia, query intravascular hemolysis, MDS??   P:  PT/INR Peripheral smear with pancytopenia Hematology/Oncology consult appreciated Trend CBC F/U on bone marrow Bx  INFECTIOUS A:   SIRS/Sepsis - amylase / lipase negative, RMSF IgG positive.   ? CAP vs aspiration P:   ABX as above. Trend PCT. Monitor WBC and fever curves.  ENDOCRINE A:   DM P:   Hold metformin, glipizide. CBG monitoring and SSI.  NEUROLOGIC A:   Acute metabolic encephalopathy - resolved P:   Minimize sedating medications. PT efforts, mobilize.    FAMILY  - Updates:  No family bedside 6/24.  GOC:  Pt states he would not want intubation again.  He is accepting of CPR but gets angry / upset regarding intubation.  Will change code status to reflect such and discuss with his children on arrival.   Noe Gens, NP-C [facility] Pgr: [phone] or if no answer [phone] 07/30/2015, 8:27 AM   Attending Note:  I have examined patient, reviewed labs, studies and notes. I have discussed the case with B Ollis, and I agree with the data and plans as amended above. 70 year old man admitted with shock and  concerns for possible sepsis. Found to have severe pancytopenia,, etiology unclear. He required stabilization with mechanical ventilation and antibiotics for presumed community acquired pneumonia. He is now extubated. A bone marrow biopsy has been performed to evaluate his pancytopenia.  His CT chest is very concerning for probable metastatic disease versus primary lung cancer. On my evaluation he appears weak but he is up to a chair, extubated and with comfortable respiratory pattern on room air. Coarse bilateral breath sounds. His chest x-ray has bilateral infiltrates. There is also a subtle right apical line that could suggest a small pneumothorax although this is unclear. We will plan to continue his antibiotics, push pulmonary hygiene. We will recheck a chest x-ray this afternoon to ensure that there is not an actual pneumothorax. Most importantly he has a workup consistent with a malignancy. A bone marrow biopsy is pending.. I believe he could benefit from biopsy of one of his multiple pulmonary lesions that in the setting of severe thrombocytopenia I would defer. We will plan to wait for his bone marrow results, follow chest imaging and reconsider biopsy of a pulmonary target should a diagnosis remains elusive. We have undertaken discussions regarding goals of care with him today. He's indicated that he would not want to be reintubated. I believe we need to continue to discuss goals in light of his probable malignancy. For now we will change his CODE STATUS to  No intubation based on these discussions. Independent CC time 35 minutes  Baltazar Apo, MD, PhD 07/30/2015, 10:32 AM [facility] [phone] or if no answer [phone]

## 2015-07-30 NOTE — Procedures (Signed)
Bronchoscopy Procedure Note Arthur Evans [id number] [birth year]  Procedure: Bronchoscopy Indications: Obtain specimens for culture and/or other diagnostic studies  Procedure Details Consent: Risks of procedure as well as the alternatives and risks of each were explained to the (patient/caregiver).  Consent for procedure obtained. Time Out: Verified patient identification, verified procedure, site/side was marked, verified correct patient position, special equipment/implants available, medications/allergies/relevent history reviewed, required imaging and test results available.  Performed  In preparation for procedure, patient was given 100% FiO2, bronchoscope lubricated and lidocaine given via ETT (3 ml). Sedation: propofol and fentanyl  Airway entered and the following bronchi were examined: RML.   Procedures performed: Brushings performed Bronchoscope removed.    Evaluation Hemodynamic Status: BP stable throughout; O2 sats: stable throughout Patient's Current Condition: stable Specimens:  Sent serosanguinous fluid Complications: No apparent complications Patient did tolerate procedure well.   GIDDINGS, OLIVIA K. 07/30/2015

## 2015-07-30 NOTE — Progress Notes (Signed)
[facility] Progress Note Patient Name: Arthur Evans. DOB: 10-May-1945 MRN: [id number]   Date of Service  07/30/2015  HPI/Events of Note  Patient still has increased WOB even with BiPAP. He will require re-intubation.  He has stated that he does not want re-intubation. However, is is listed as a Full Code status.   eICU Interventions  Will ask Dr. Lincoln Maxin who is the PCCM on call physician to evaluate him at bedside.      Intervention Category Intermediate Interventions: Respiratory distress - evaluation and management  Lysle Dingwall 07/30/2015, 7:38 PM

## 2015-07-30 NOTE — Progress Notes (Signed)
[employer] Progress Note Patient Name: Arthur Evans. DOB: [birth year] MRN: [id number]   Date of Service  07/30/2015  HPI/Events of Note  Increased respiratory distress - Now on NRBM with sat = 94 and RR = 30's to 50.  eICU Interventions  Will order: 1. Place on BiPAP IPAP 12/EPAP 5. 2. Portable CXR STAT. 3. ABG at 7 PM.     Intervention Category Intermediate Interventions: Respiratory distress - evaluation and management  Sommer,Steven Eugene 07/30/2015, 6:03 PM

## 2015-07-30 NOTE — Procedures (Signed)
Intubation Procedure Note Lashawn Dew [id number] 02-17-1945  Procedure: Intubation Indications: Respiratory insufficiency  Procedure Details Consent: Risks of procedure as well as the alternatives and risks of each were explained to the (patient/caregiver).  Consent for procedure obtained. Time Out: Verified patient identification, verified procedure, site/side was marked, verified correct patient position, special equipment/implants available, medications/allergies/relevent history reviewed, required imaging and test results available.  Performed  Maximum sterile technique was used including gloves and hand hygiene.  Pt was intubated with an 8.0 ETT using the Glide Scope. Correct tube placement was confirmed with CO2 colometric device and auscultation.      Evaluation Hemodynamic Status: BP stable throughout; O2 sats: stable throughout Patient's Current Condition: stable Complications: No apparent complications Patient did tolerate procedure well. Chest X-ray ordered to verify placement.  CXR: pending.   GIDDINGS, OLIVIA K. 07/30/2015

## 2015-07-30 NOTE — Evaluation (Signed)
Physical Therapy Evaluation Patient Details Name: Arthur Evans. MRN: [id number] DOB: September 24, 1945 Today's Date: 07/30/2015   History of Present Illness  70 year old male with PMH as below, which includes AAA, DM on metformin, MI, DVT, and seizure disorder (thought due to hypoglycemia. 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 He presented to [facility] ED 6/19 with complaints of generalized weakness for 4 days. In ED he was found to be confused, febrile to 102.3 and hypotensive. He denied and symptoms of GI bleed, but hemoglobin was found to be 4.3. He required intubation for airway protection. He was transfused 2 units of emergent release blood and PCCM was called for admission.   Clinical Impression  Pt admitted with above diagnosis. Pt currently with functional limitations due to the deficits listed below (see PT Problem List). Pt was able to stand pivot but needed mod assist for balance. Hopeful that pt will progress and be able to use RW at home.  Pt states he has a friend who can come and help him.  If so, HHPT appropriate.  If no 24 hour care, will need SNF.   Pt will benefit from skilled PT to increase their independence and safety with mobility to allow discharge to the venue listed below.      Follow Up Recommendations Home health PT;Supervision/Assistance - 24 hour    Equipment Recommendations  Rolling walker with 5" wheels;3in1 (PT)    Recommendations for Other Services OT consult     Precautions / Restrictions Precautions Precautions: Fall Restrictions Weight Bearing Restrictions: No      Mobility  Bed Mobility Overal bed mobility: Needs Assistance Bed Mobility: Supine to Sit     Supine to sit: Min assist     General bed mobility comments: assist for LEs and elevation of trunk  Transfers Overall transfer level: Needs  assistance Equipment used: 2 person hand held assist Transfers: Sit to/from [employer] Transfers Sit to Stand: Mod assist Stand pivot transfers: Mod assist       General transfer comment: Pt needed bil UE support for balance as he flexes trunk and has a posterior lean needing min to mod steadying assist.  Able to take pivotal steps with cues for sequencing.   Ambulation/Gait                Stairs            Wheelchair Mobility    Modified Rankin (Stroke Patients Only)       Balance Overall balance assessment: Needs assistance;History of Falls Sitting-balance support: Feet supported;Bilateral upper extremity supported Sitting balance-Leahy Scale: Poor Sitting balance - Comments: relies on UE support for balance in sitting Postural control: Posterior lean Standing balance support: Bilateral upper extremity supported;During functional activity Standing balance-Leahy Scale: Poor Standing balance comment: relies on bil UE support for standing.                              Pertinent Vitals/Pain Pain Assessment: No/denies pain  O2 at 3LO2 with sats >90%; BP 110/52 initially and 136/64 after transfer.      Home Living Family/patient expects to be discharged to:: Private residence Living Arrangements: Alone Available Help at Discharge: Friend(s);Available 24 hours/day (states he can get someone to stay with him) Type of Home:  House Home Access: Stairs to enter Entrance Stairs-Rails: None Technical brewer of Steps: 4 Home Layout: One level Home Equipment: None      Prior Function Level of Independence: Independent               Hand Dominance        Extremity/Trunk Assessment   Upper Extremity Assessment: Defer to OT evaluation           Lower Extremity Assessment: Generalized weakness      Cervical / Trunk Assessment: Kyphotic  Communication   Communication: No difficulties  Cognition Arousal/Alertness:  Awake/alert Behavior During Therapy: Flat affect Overall Cognitive Status: Impaired/Different from baseline Area of Impairment: Safety/judgement;Problem solving         Safety/Judgement: Decreased awareness of safety;Decreased awareness of deficits   Problem Solving: Difficulty sequencing;Requires verbal cues;Requires tactile cues      General Comments      Exercises        Assessment/Plan    PT Assessment Patient needs continued PT services  PT Diagnosis Generalized weakness   PT Problem List Decreased balance;Decreased activity tolerance;Decreased mobility;Decreased safety awareness;Decreased knowledge of precautions;Decreased cognition;Decreased knowledge of use of DME  PT Treatment Interventions DME instruction;Gait training;Functional mobility training;Therapeutic exercise;Stair training;Therapeutic activities;Balance training;Patient/family education   PT Goals (Current goals can be found in the Care Plan section) Acute Rehab PT Goals Patient Stated Goal: to get home PT Goal Formulation: With patient Time For Goal Achievement: 08/13/15 Potential to Achieve Goals: Good    Frequency Min 3X/week   Barriers to discharge Decreased caregiver support      Co-evaluation               End of Session Equipment Utilized During Treatment: Gait belt;Oxygen Activity Tolerance: Patient limited by fatigue Patient left: in chair;with call bell/phone within reach;with nursing/sitter in room Nurse Communication: Mobility status         Time: [id number] PT Time Calculation (min) (ACUTE ONLY): 25 min   Charges:   PT Evaluation $PT Eval Moderate Complexity: 1 Procedure PT Treatments $Therapeutic Activity: 8-22 mins   PT G CodesIrwin Brakeman F 2015-08-27, 9:07 AM [facility] [phone] (pager)

## 2015-07-30 NOTE — Progress Notes (Signed)
Leak is 83%. RT will continue to monitor.

## 2015-07-31 ENCOUNTER — Inpatient Hospital Stay (HOSPITAL_COMMUNITY): Payer: Commercial Managed Care - HMO

## 2015-07-31 DIAGNOSIS — R918 Other nonspecific abnormal finding of lung field: Secondary | ICD-10-CM

## 2015-07-31 LAB — BASIC METABOLIC PANEL
ANION GAP: 11 (ref 5–15)
BUN: 33 mg/dL — ABNORMAL HIGH (ref 6–20)
CALCIUM: 7.9 mg/dL — AB (ref 8.9–10.3)
CHLORIDE: 100 mmol/L — AB (ref 101–111)
CO2: 30 mmol/L (ref 22–32)
Creatinine, Ser: 1.31 mg/dL — ABNORMAL HIGH (ref 0.61–1.24)
GFR calc non Af Amer: 53 mL/min — ABNORMAL LOW (ref >60)
GLUCOSE: 229 mg/dL — AB (ref 65–99)
POTASSIUM: 3.8 mmol/L (ref 3.5–5.1)
Sodium: 141 mmol/L (ref 135–145)

## 2015-07-31 LAB — PREPARE PLATELET PHERESIS: Unit division: 0

## 2015-07-31 LAB — PHOSPHORUS
PHOSPHORUS: 3.2 mg/dL (ref 2.5–4.6)
Phosphorus: 2.8 mg/dL (ref 2.5–4.6)

## 2015-07-31 LAB — MAGNESIUM
MAGNESIUM: 1.9 mg/dL (ref 1.7–2.4)
Magnesium: 2.2 mg/dL (ref 1.7–2.4)

## 2015-07-31 LAB — CBC
HEMATOCRIT: 30.5 % — AB (ref 39.0–52.0)
HEMOGLOBIN: 9.7 g/dL — AB (ref 13.0–17.0)
MCH: 28.5 pg (ref 26.0–34.0)
MCHC: 31.8 g/dL (ref 30.0–36.0)
MCV: 89.7 fL (ref 78.0–100.0)
Platelets: 24 10*3/uL — CL (ref 150–400)
RBC: 3.4 MIL/uL — AB (ref 4.22–5.81)
RDW: 19.9 % — ABNORMAL HIGH (ref 11.5–15.5)
WBC: 9.3 10*3/uL (ref 4.0–10.5)

## 2015-07-31 LAB — GLUCOSE, CAPILLARY
GLUCOSE-CAPILLARY: 166 mg/dL — AB (ref 65–99)
GLUCOSE-CAPILLARY: 180 mg/dL — AB (ref 65–99)
GLUCOSE-CAPILLARY: 180 mg/dL — AB (ref 65–99)
GLUCOSE-CAPILLARY: 194 mg/dL — AB (ref 65–99)
GLUCOSE-CAPILLARY: 251 mg/dL — AB (ref 65–99)
Glucose-Capillary: 182 mg/dL — ABNORMAL HIGH (ref 65–99)

## 2015-07-31 MED ORDER — ANTISEPTIC ORAL RINSE SOLUTION (CORINZ)
7.0000 mL | OROMUCOSAL | Status: DC
  Administered 2015-07-31 – 2015-08-01 (×10): 7 mL via OROMUCOSAL

## 2015-07-31 MED ORDER — ATROPINE SULFATE 1 MG/10ML IJ SOSY
PREFILLED_SYRINGE | INTRAMUSCULAR | Status: AC
  Filled 2015-07-31: qty 10

## 2015-07-31 MED ORDER — PRO-STAT SUGAR FREE PO LIQD
30.0000 mL | Freq: Two times a day (BID) | ORAL | Status: DC
  Administered 2015-07-31 – 2015-08-01 (×3): 30 mL
  Filled 2015-07-31 (×3): qty 30

## 2015-07-31 MED ORDER — VITAL HIGH PROTEIN PO LIQD
1000.0000 mL | ORAL | Status: DC
  Administered 2015-07-31: 1000 mL
  Filled 2015-07-31: qty 1000

## 2015-07-31 MED ORDER — CHLORHEXIDINE GLUCONATE 0.12% ORAL RINSE (MEDLINE KIT)
15.0000 mL | Freq: Two times a day (BID) | OROMUCOSAL | Status: DC
  Administered 2015-07-31 – 2015-08-02 (×5): 15 mL via OROMUCOSAL

## 2015-07-31 MED ORDER — SODIUM CHLORIDE 0.9 % IV SOLN: INTRAVENOUS | Status: DC

## 2015-07-31 NOTE — Progress Notes (Signed)
[place] Progress Note Patient Name: Arthur Evans. DOB: 15-Nov-1945 MRN: [id number]   Date of Service  07/31/2015  HPI/Events of Note  Camara check on patient. Sudden bradycardia with repositioning for chest x-ray this morning. Blood pressure remains stable. Sedation caused by nurse at bedside. Patient not currently on any beta blocker or calcium channel blocker. Atropine at bedside.   eICU Interventions  1. Continue telemetry monitoring.  2. Continue to hold sedation for now.      Intervention Category Major Interventions: Arrhythmia - evaluation and management  Tera Partridge 07/31/2015, 4:38 AM

## 2015-07-31 NOTE — Progress Notes (Signed)
PULMONARY / CRITICAL CARE MEDICINE   Name: Arthur Evans. MRN: [id number] DOB: 1945/05/26    ADMISSION DATE:  07/31/2015 CONSULTATION DATE:  6/19  REFERRING MD:  Dr. Jeanell Sparrow EDP  CHIEF COMPLAINT:  AMS  HISTORY OF PRESENT ILLNESS:  70 year old male with PMH as below, which includes AAA, DM on metformin, MI, DVT, and seizure disorder (thought due to hypoglycemia.  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 He presented to [facility] ED 6/19 with complaints of generalized weakness for 4 days. In ED he was found to be confused, febrile to 102.3 and hypotensive. He denied and symptoms of GI bleed, but hemoglobin was found to be 4.3. He required intubation for airway protection. He was transfused 2 units of emergent release blood and PCCM was called for admission.   SUBJECTIVE:   Pt reintubated overnight, FOB performed with brushings performed per fellow in RML.  Episode of bradycardia overnight as well, did not require interventions.  Isolated episode of hypoglycemia as well.   VITAL SIGNS: BP 115/58 mmHg  Pulse 59  Temp(Src) 97.9 F (36.6 C) (Core (Comment))  Resp 16  Ht '5\' 8"'$  (1.727 m)  Wt 129 lb 3 oz (58.6 kg)  BMI 19.65 kg/m2  SpO2 100%  HEMODYNAMICS:    VENTILATOR SETTINGS: Vent Mode:  [-] PRVC FiO2 (%):  [40 %-50 %] 40 % Set Rate:  [12 bmp-18 bmp] 18 bmp Vt Set:  [550 mL] 550 mL PEEP:  [5 cmH20-6 cmH20] 5 cmH20 Plateau Pressure:  [21 cmH20-22 cmH20] 22 cmH20  INTAKE / OUTPUT: I/O last 3 completed shifts: In: 6269 [I.V.:564; Blood:230; IV SWNIOEVOJ:500] Out: 9381 [Urine:4735]  PHYSICAL EXAMINATION: General: elderly male in NAD on vent Neuro: sedated on vent HEENT: [place]/AT, PERRL 52m, pale conjunctiva Cardiovascular: RRR, no MRG, +2 pulses, no edema Lungs: non-labored on MV, lungs bilaterally coarse with wheezing Abdomen: Soft,  non-distended, BS+ Musculoskeletal: No acute deformity or ROM limitation. Skin: Grossly intact, skin warm and dry.  LABS:  BMET  Recent Labs Lab 07/29/15 1845 07/30/15 0547 07/31/15 0500  NA 144 142 141  K 3.8 3.7 3.8  CL 110 103 100*  CO2 '26 28 30  '$ BUN 38* 40* 33*  CREATININE 1.40* 1.38* 1.31*  GLUCOSE 93 172* 229*   Electrolytes  Recent Labs Lab 07/29/15 0500 07/29/15 1645 07/29/15 1845 07/30/15 0547 07/31/15 0500  CALCIUM 7.9*  --  8.0* 7.9* 7.9*  MG 3.0* 2.7*  --  2.2  --   PHOS 2.8 1.9*  --  2.5  --     CBC  Recent Labs Lab 07/29/15 0500 07/30/15 0547 07/31/15 0500  WBC 4.8 7.1 9.3  HGB 9.8* 9.8* 9.7*  HCT 29.6* 29.9* 30.5*  PLT 13* 12* 24*   Coag's  Recent Labs Lab 07/26/15 0217 07/28/15 0336  APTT  --  32  INR 1.85*  --    Sepsis Markers  Recent Labs Lab 07/14/2015 2032 07/13/2015 2231 07/26/15 0217 07/26/15 1508 07/27/15 0436 07/28/15 0336  LATICACIDVEN 4.88* 6.62* 3.7*  --   --   --   PROCALCITON  --   --   --  5.09 4.33 3.71   ABG  Recent Labs Lab 07/29/15 0939 07/30/15 0340 07/30/15 2130  PHART 7.381 7.470* 7.471*  PCO2ART 40.9 42.3 42.7  PO2ART 97.8 57.4* 119*   Liver  Enzymes  Recent Labs Lab 07/29/2015 2018 07/27/15 0843 07/28/15 0336  AST 24 <5* 100*  ALT 15* 442* 247*  ALKPHOS 32* 52 41  BILITOT 1.0 0.9 1.7*  ALBUMIN 2.3* 2.3* 1.9*   Cardiac Enzymes  Recent Labs Lab 07/27/15 0843  TROPONINI 0.23*   Glucose  Recent Labs Lab 07/30/15 1603 07/30/15 1954 07/30/15 2036 07/30/15 2356 07/31/15 0356 07/31/15 0738  GLUCAP 196* 50* 85 180* 251* 182*   Imaging Dg Chest Port 1 View  07/31/2015  CLINICAL DATA:  Acute respiratory failure, diabetes mellitus, prior MI, abdominal aortic aneurysm, essential benign hypertension, smoker EXAM: PORTABLE CHEST 1 VIEW COMPARISON:  Portable exam 0440 hours compared to 07/30/2015 FINDINGS: Tip of endotracheal tube projects 3.9 cm above carina. Nasogastric tube extends  into stomach. Normal heart size, mediastinal contours, and pulmonary vascularity. Atherosclerotic calcification aorta. Patchy BILATERAL infiltrates greatest in the upper lobes, minimally improved on LEFT. No pleural effusion or pneumothorax. Prior spinal fixation. IMPRESSION: Persistent BILATERAL pulmonary infiltrates slightly improved in LEFT upper lobe since previous exam. Aortic atherosclerosis. Electronically Signed   By: Lavonia Dana M.D.   On: 07/31/2015 08:32   Portable Chest Xray  07/30/2015  CLINICAL DATA:  Pulmonary edema.  Intubation.  Respiratory distress EXAM: PORTABLE CHEST 1 VIEW COMPARISON:  07/30/2015 FINDINGS: Endotracheal tube 4 cm from carina. Stable upper lobe airspace disease. No pneumothorax IMPRESSION: Endotracheal tube in good position. Upper lobe pulmonary edema Electronically Signed   By: Suzy Bouchard M.D.   On: 07/30/2015 21:25   Dg Chest Port 1 View  07/30/2015  CLINICAL DATA:  Hypoxemia, prior myocardial infarction EXAM: PORTABLE CHEST 1 VIEW COMPARISON:  07/30/2015 548 FINDINGS: Normal cardiac silhouette. Posterior spinal fusion noted interval increase in bilateral upper lobe airspace disease. No pneumothorax. No pleural fluid. IMPRESSION: Increasing upper lobe airspace disease suggestive of worsening pulmonary edema. These results will be called to the ordering clinician or representative by the Radiologist Assistant, and communication documented in the PACS or zVision Dashboard. Electronically Signed   By: Suzy Bouchard M.D.   On: 07/30/2015 18:59    STUDIES:  CT abd 6/19 >> patchy bibasilar airspace opacities, most prominent in RML, small amt ascites within the abd / pelvis, aneurysmal dilation of the infrarenal abdominal aorta (unchanged from 2015), vague soft tissue edema within the upper pelvis, tracking about the small bowel loops, diffuse CAD, enlarged prostate ECHO 6/20 >> LV with mild concentric hypertrophy, LVEF 45-50%, diffuse hypokinesis, grade 2 diastolic  dysfunction, PA peak 38 CT Chest 6/22 >> extensive patchy GGO and consolidation throughout both lungs, mild mediastinal LAN, small effusions Bone Marrow Biopsy 6/23 >>   CULTURES: BCx2 6/19 >> negative Urine 6/19 >> negative U. Legionella 6/20 >> negative  RMSF 6/20 >> POSITIVE IgG PCP DFA 6/21 >> negative  BAL 6/21 >> negative HIV 6/21 >> neg BAL (RML) 6/24 >>   HEMATOLOGY: Folate >> 1164 B12 >> 998 Reticulocyte >> 32.1, 15.9 Peripheral Smear >> pancytopenia  RMSF >> IgG POSITIVE  ANTIBIOTICS: Ceftriaxone 6/19 >> Azithromycin 6/19 >> 6/20 Doxycycline 6/20 >>   SIGNIFICANT EVENTS: 6/19  admit with fever, ams, pancytopenia.  Transfused, intubated 6/20  Extubated.  6/21  Developed resp distress early am, lasix + biPAP.  Failed, reintubated  6/23  Bone arrow Bx 6/24  Resp distress, failed bipap. Intubated.    LINES/TUBES: ETT 6/19 >> 6/20, 6/21>> 6/23, 6/24 >>   DISCUSSION: 70 year old male presented with profound anemia, passed out, PNA, required intubation. Found to have pancytopenia  requiring 4 units PRBC. Remained hypotensive. Initial concerns for septic shock.  He was empirically treated for CAP, AECOPD and RMSF with hx of tick bites.  Patient course complicated by recurrent respiratory failure.  Pancytopenia work up with BM Biopsy on 6/23, results pending.     ASSESSMENT / PLAN:  PULMONARY A: Acute Respiratory Distress - early am 6/21, initiated bipap Bilateral Ground Glass Opacities RLL PNA Inability to protect airway in setting AMS - resolved, extubated 6/20.  Failed 6/21, reintubated.   COPD with acute exacerbation Tobacco Abuse - smoked since age 70, heaviest up to 1.5 ppd, now 5-6 cigs per day Possible CAP vs aspiration - appears rounded on CXR, more infiltrative on CT abd/pelvis L lung nodule from prior CT abdomen P:   PRVC 8 cc/kg  Wean PEEP / FiO2 for sats > 92% Trend CXR Scheduled Duonebs, PRN albuterol Continue Solumedrol '60mg'$  q12    CARDIOVASCULAR A:  Shock - resolved. Suspect septic vs hypovolmia/hemmorhagic as well. S/P 3 units PRBC's, 5L NS Chronic diastolic CHF (Grade 1 DD) AAA 3.88x3.81 CM - unchanged from 2015 H/o CAD P:  Telemetry monitoring MAP goal > 63mHg  RENAL A:   AKI - resolving Pseudohypocalcemia (corrects to 9.1) AGMA - lactic, clearing  P:   Follow BMP / UOP  Correct electrolytes as indicated NS to KVO  GASTROINTESTINAL A:   Ascites - small amt noted on CT, ? Inflammatory vs edema.  Acute hepatitis panel negative, LDH 1455 Protein Calorie Malnutrition P:   NPO Begin TF Pepcid for SUP  Trend LDH  HEMATOLOGIC A:   Severe pancytopenia - etiology unclear, could be due to profound infection, has had tick bites in recent week (no rash on exam), concern for malignancy with lab disturbances (decreased appetite, mild weight loss).  S/P 4 units PRBC 6/19 Elevated LDH / anemia, query intravascular hemolysis, MDS??   P:  PT/INR Peripheral smear with pancytopenia Hematology/Oncology consult appreciated Trend CBC F/U on bone marrow Bx (likely will take 7 days for results)  INFECTIOUS A:   SIRS/Sepsis - amylase / lipase negative, RMSF IgG positive.   ? CAP vs aspiration P:   ABX as above. Trend PCT. Monitor WBC and fever curve. Stop date added to Doxy for 7 days total   ENDOCRINE A:   DM P:   Hold metformin, glipizide. CBG monitoring and SSI. Lantus 15 units QD Novolog 2 units Q6 for "meal coverage" while on TF  NEUROLOGIC A:   Acute metabolic encephalopathy - resolved P:   Minimize sedating medications. PT efforts, mobilize.    FAMILY  - Updates:  No family bedside 6/25.    GOC:  Patient & family reversed code status to FULL CODE.    BNoe Gens NP-C [place] [facility] Pgr: [phone] or if no answer [phone]/25/2017, 8:53 AM   Attending Note:  I have examined patient, reviewed labs, studies and notes. I have discussed the case with B  Ollis, and I agree with the data and plans as amended above. 70year old man admitted with shock and concerns for possible sepsis. Found to have severe pancytopenia, etiology unclear. He has required stabilization with mechanical ventilation and antibiotics for presumed community acquired pneumonia. Unfortunately he has been reiintubated - his 3rd ETT during this illness. Bone marrow biopsy has been performed to evaluate his pancytopenia. His CT chest is very concerning for probable metastatic disease versus primary lung cancer. On my evaluation he has B crackles, rhonchi with coarse bilateral breath sounds. His  chest x-ray has bilateral infiltrates but no evidence of PTX as had been mentioned on 6/24 film. We will continue efforts at weaning although concern now is that he will not extubate. Workup is consistent with a malignancy. A bone marrow biopsy is pending.  I believe he could get a dx from biopsy of one of his multiple pulmonary lesions but overall prognosis and thrombocytopenia make this a poor option at least for now. We will plan to wait for his bone marrow results, follow chest imaging and reconsider biopsy of a pulmonary target depending on course. Independent critical care time is 36 minutes.   Baltazar Apo, MD, PhD 07/31/2015, 1:21 PM [place] [phone] or if no answer [phone]

## 2015-08-01 ENCOUNTER — Inpatient Hospital Stay (HOSPITAL_COMMUNITY): Payer: Commercial Managed Care - HMO

## 2015-08-01 ENCOUNTER — Telehealth: Payer: Self-pay | Admitting: Hematology and Oncology

## 2015-08-01 DIAGNOSIS — J9600 Acute respiratory failure, unspecified whether with hypoxia or hypercapnia: Secondary | ICD-10-CM

## 2015-08-01 LAB — PHOSPHORUS
Phosphorus: 2.4 mg/dL — ABNORMAL LOW (ref 2.5–4.6)
Phosphorus: 2.5 mg/dL (ref 2.5–4.6)

## 2015-08-01 LAB — BASIC METABOLIC PANEL
ANION GAP: 9 (ref 5–15)
BUN: 34 mg/dL — ABNORMAL HIGH (ref 6–20)
CO2: 29 mmol/L (ref 22–32)
Calcium: 7.7 mg/dL — ABNORMAL LOW (ref 8.9–10.3)
Chloride: 103 mmol/L (ref 101–111)
Creatinine, Ser: 1.08 mg/dL (ref 0.61–1.24)
GFR calc Af Amer: >60 mL/min (ref >60)
GLUCOSE: 186 mg/dL — AB (ref 65–99)
POTASSIUM: 3.3 mmol/L — AB (ref 3.5–5.1)
Sodium: 141 mmol/L (ref 135–145)

## 2015-08-01 LAB — CBC
HEMATOCRIT: 28.7 % — AB (ref 39.0–52.0)
HEMATOCRIT: 28.3 % — AB (ref 39.0–52.0)
HEMOGLOBIN: 9.1 g/dL — AB (ref 13.0–17.0)
HEMOGLOBIN: 8.9 g/dL — AB (ref 13.0–17.0)
MCH: 28.7 pg (ref 26.0–34.0)
MCH: 28.4 pg (ref 26.0–34.0)
MCHC: 31.7 g/dL (ref 30.0–36.0)
MCHC: 31.4 g/dL (ref 30.0–36.0)
MCV: 90.5 fL (ref 78.0–100.0)
MCV: 90.4 fL (ref 78.0–100.0)
PLATELETS: 28 10*3/uL — AB (ref 150–400)
Platelets: 9 10*3/uL — CL (ref 150–400)
RBC: 3.17 MIL/uL — ABNORMAL LOW (ref 4.22–5.81)
RBC: 3.13 MIL/uL — AB (ref 4.22–5.81)
RDW: 19.8 % — ABNORMAL HIGH (ref 11.5–15.5)
RDW: 19.9 % — AB (ref 11.5–15.5)
WBC: 9.4 10*3/uL (ref 4.0–10.5)
WBC: 11.8 10*3/uL — ABNORMAL HIGH (ref 4.0–10.5)

## 2015-08-01 LAB — MAGNESIUM
Magnesium: 2 mg/dL (ref 1.7–2.4)
Magnesium: 1.9 mg/dL (ref 1.7–2.4)

## 2015-08-01 LAB — GLUCOSE, CAPILLARY
GLUCOSE-CAPILLARY: 170 mg/dL — AB (ref 65–99)
GLUCOSE-CAPILLARY: 199 mg/dL — AB (ref 65–99)
GLUCOSE-CAPILLARY: 232 mg/dL — AB (ref 65–99)
Glucose-Capillary: 198 mg/dL — ABNORMAL HIGH (ref 65–99)
Glucose-Capillary: 249 mg/dL — ABNORMAL HIGH (ref 65–99)
Glucose-Capillary: 171 mg/dL — ABNORMAL HIGH (ref 65–99)

## 2015-08-01 LAB — LACTATE DEHYDROGENASE: LDH: 965 U/L — ABNORMAL HIGH (ref 98–192)

## 2015-08-01 MED ORDER — SODIUM CHLORIDE 0.9 % IV SOLN: Freq: Once | INTRAVENOUS | Status: DC

## 2015-08-01 MED ORDER — VITAL HIGH PROTEIN PO LIQD
1000.0000 mL | ORAL | Status: DC
  Administered 2015-08-01: 1000 mL

## 2015-08-01 MED ORDER — HYDRALAZINE HCL 25 MG PO TABS
25.0000 mg | ORAL_TABLET | Freq: Four times a day (QID) | ORAL | Status: DC
Start: 2015-08-01 — End: 2015-08-02
  Administered 2015-08-01 – 2015-08-02 (×4): 25 mg
  Filled 2015-08-01 (×6): qty 1

## 2015-08-01 MED ORDER — VITAL HIGH PROTEIN PO LIQD: 1000.0000 mL | ORAL | Status: DC

## 2015-08-01 MED ORDER — METHYLPREDNISOLONE SODIUM SUCC 40 MG IJ SOLR
40.0000 mg | Freq: Two times a day (BID) | INTRAMUSCULAR | Status: DC
  Administered 2015-08-01 – 2015-08-03 (×5): 40 mg via INTRAVENOUS
  Filled 2015-08-01 (×8): qty 1

## 2015-08-01 NOTE — Progress Notes (Signed)
Nutrition Follow-up / Consult  DOCUMENTATION CODES:   Severe malnutrition in context of chronic illness  INTERVENTION:    Continue TF via OGT with Vital High Protein, increase goal rate to 65 ml/h (1560 ml per day) to provide 1560 kcals, 136.5 gm protein, 1304 ml free water daily.  D/C Prostat.  NUTRITION DIAGNOSIS:   Inadequate oral intake related to inability to eat as evidenced by NPO status.  Ongoing  GOAL:   Patient will meet greater than or equal to 90% of their needs  Progressing  MONITOR:   Vent status, Labs, Weight trends, TF tolerance, I & O's  REASON FOR ASSESSMENT:   Consult Enteral/tube feeding initiation and management  ASSESSMENT:   70 year old male presented with AMS requiring intubation. Found to have pancytopenia requiring 4 units PRBC. Remained hypotensive. Concern septic shock.   Discussed patient in ICU rounds and with RN today. RN reports CBG's were elevated last week (400's), but have improved over the weekend. CBG's: [phone] Family meeting to discuss goals of care has been scheduled for today.  Received MD Consult for TF initiation and management. Patient is currently receiving Vital High Protein via OGT at 40 ml/h (960 ml/day) with Prostat 30 ml BID to provide 1160 kcals, 114 gm protein, 803 ml free water daily.  Patient is currently intubated on ventilator support MV: 11.1 L/min Temp (24hrs), Avg:98.4 F (36.9 C), Min:97.7 F (36.5 C), Max:99.1 F (37.3 C)  Propofol: 10.5 ml/hr providing 277 kcal per day Labs reviewed: potassium and phosphorus low.  Diet Order:  Diet NPO time specified  Skin:  Reviewed, no issues  Last BM:  unknown  Height:   Ht Readings from Last 1 Encounters:  07/28/2015 5\' 8"  (1.727 m)    Weight:   Wt Readings from Last 1 Encounters:  08/01/15 130 lb 1.1 oz (59 kg)    Ideal Body Weight:  70 kg  BMI:  Body mass index is 19.78 kg/(m^2).  Estimated Nutritional Needs:   Kcal:  [id number]  Protein:   90-110 gm  Fluid:  1.8-2 L  EDUCATION NEEDS:   No education needs identified at this time  Molli Barrows, [place], [place], [place] Pager [phone] After Hours Pager [phone]

## 2015-08-01 NOTE — Care Management Important Message (Signed)
Important Message  Patient Details  Name: Arthur Evans. MRN: [id number] Date of Birth: Jun 14, 1945   Medicare Important Message Given:  Yes    Wendee Hata Abena 08/01/2015, 10:30 AM

## 2015-08-01 NOTE — Progress Notes (Signed)
CRITICAL VALUE ALERT  Critical value received:  Platelets 9  Date of notification:  07/22/15  Time of notification:  0515  Critical value read back:Yes.    Nurse who received alert:  km  Responding MD:  elink  Time MD responded:  [phone]

## 2015-08-01 NOTE — Progress Notes (Signed)
Inpatient Diabetes Program Recommendations  AACE/ADA: New Consensus Statement on Inpatient Glycemic Control (2015)  Target Ranges:  Prepandial:   less than 140 mg/dL      Peak postprandial:   less than 180 mg/dL (1-2 hours)      Critically ill patients:  140 - 180 mg/dL   Lab Results  Component Value Date   GLUCAP 198* 08/01/2015   HGBA1C 7.4* 12/02/2014    Inpatient Diabetes Program Recommendations:    While on tube feeds, consider increasing Novolog tube feed coverage to q 4 hours.   Thanks, Adah Perl, RN, BC-ADM Inpatient Diabetes Coordinator Pager [phone] (8a-5p)

## 2015-08-01 NOTE — Progress Notes (Signed)
PT Cancellation Note  Patient Details Name: Arthur Evans. MRN: [id number] DOB: 05/22/1945   Cancelled Treatment:    Reason Eval/Treat Not Completed: Patient's level of consciousness Pt re-intubated due to respiratory distress and currently sedated so not able to participate in PT. Will follow up as appropriate.   Marguarite Arbour A Kynzee Devinney 08/01/2015, 9:35 AM Wray Kearns, [place], DPT [phone]

## 2015-08-01 NOTE — Telephone Encounter (Signed)
I received a telephone call from the pathologist. 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 The patient remains in the ICU. I spoke with the APP covering [facility] ICU and inform him results of the bone marrow biopsy. He will speak with family members to inform them about test results. At present time, all patients with diagnosis of acute leukemia who desired treatment needs to be transferred to tertiary center for treatment as we do not treat these patients within [facility] system.

## 2015-08-01 NOTE — Progress Notes (Signed)
Spoke w/ Son re: results from path. He is aware we have limited options w/ his acute illness AND now the dx of acute Leukemia.  Plan We will make full DNR Work towards one-way extubation.   Erick Colace ACNP-BC [place] Pager # [phone] OR # [phone] if no answer

## 2015-08-01 NOTE — Progress Notes (Signed)
PULMONARY / CRITICAL CARE MEDICINE   Name: Arthur Evans. MRN: [id number] DOB: 05-28-1945    ADMISSION DATE:  07/31/2015 CONSULTATION DATE:  6/19  REFERRING MD:  Dr. Jeanell Sparrow EDP  CHIEF COMPLAINT:  AMS  HISTORY OF PRESENT ILLNESS:  70 year old male with PMH as below, which includes AAA, DM on metformin, MI, DVT, and seizure disorder (thought due to hypoglycemia.  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 He presented to [facility] ED 6/19 with complaints of generalized weakness for 4 days. In ED he was found to be confused, febrile to 102.3 and hypotensive. He denied and symptoms of GI bleed, but hemoglobin was found to be 4.3. He required intubation for airway protection. He was transfused 2 units of emergent release blood and PCCM was called for admission.   SUBJECTIVE:   Remains on vent, brady  VITAL SIGNS: BP 144/62 mmHg  Pulse 54  Temp(Src) 98.2 F (36.8 C) (Core (Comment))  Resp 18  Ht '5\' 8"'$  (1.727 m)  Wt 58.6 kg (129 lb 3 oz)  BMI 19.65 kg/m2  SpO2 100%  HEMODYNAMICS:    VENTILATOR SETTINGS: Vent Mode:  [-] PRVC FiO2 (%):  [40 %] 40 % Set Rate:  [15 bmp-18 bmp] 15 bmp Vt Set:  [550 mL] 550 mL PEEP:  [5 cmH20] 5 cmH20 Plateau Pressure:  [16 cmH20-22 cmH20] 20 cmH20  INTAKE / OUTPUT: I/O last 3 completed shifts: In: 2820.5 [I.V.:1260.5; Blood:230; NG/GT:380; IV Piggyback:950] Out: 2835 [Urine:2835]  PHYSICAL EXAMINATION: General: elderly male in NAD on vent Neuro: sedated on vent, rass -3 HEENT: PERRL 43m, pale conjunctiva Cardiovascular: RBR, no MRG, +2 pulses Lungs: coarse Abdomen: Soft, non-distended, BS+ Musculoskeletal: No acute deformity or ROM limitation. Skin: Grossly intact, skin warm and dry.  LABS:  BMET  Recent Labs Lab 07/30/15 0547 07/31/15 0500 08/01/15 0442  NA 142 141 141  K 3.7 3.8 3.3*   CL 103 100* 103  CO2 '28 30 29  '$ BUN 40* 33* 34*  CREATININE 1.38* 1.31* 1.08  GLUCOSE 172* 229* 186*   Electrolytes  Recent Labs Lab 07/30/15 0547 07/31/15 0500 07/31/15 0949 07/31/15 1640 08/01/15 0442  CALCIUM 7.9* 7.9*  --   --  7.7*  MG 2.2  --  2.2 1.9 2.0  PHOS 2.5  --  2.8 3.2 2.4*    CBC  Recent Labs Lab 07/30/15 0547 07/31/15 0500 08/01/15 0442  WBC 7.1 9.3 9.4  HGB 9.8* 9.7* 9.1*  HCT 29.9* 30.5* 28.7*  PLT 12* 24* 9*   Coag's  Recent Labs Lab 07/26/15 0217 07/28/15 0336  APTT  --  32  INR 1.85*  --    Sepsis Markers  Recent Labs Lab 07/16/2015 2032 07/12/2015 2231 07/26/15 0217 07/26/15 1508 07/27/15 0436 07/28/15 0336  LATICACIDVEN 4.88* 6.62* 3.7*  --   --   --   PROCALCITON  --   --   --  5.09 4.33 3.71   ABG  Recent Labs Lab 07/29/15 0939 07/30/15 0340 07/30/15 2130  PHART 7.381 7.470* 7.471*  PCO2ART 40.9 42.3 42.7  PO2ART 97.8 57.4* 119*   Liver Enzymes  Recent Labs Lab 07/15/2015 2018 07/27/15 0843 07/28/15 0336  AST 24 <5* 100*  ALT 15* 442* 247*  ALKPHOS 32* 52 41  BILITOT 1.0 0.9 1.7*  ALBUMIN 2.3* 2.3* 1.9*  Cardiac Enzymes  Recent Labs Lab 07/27/15 0843  TROPONINI 0.23*   Glucose  Recent Labs Lab 07/31/15 0738 07/31/15 1227 07/31/15 1626 07/31/15 1941 07/31/15 2355 08/01/15 0402  GLUCAP 182* 180* 194* 166* 232* 199*   Imaging No results found.  STUDIES:  CT abd 6/19 >> patchy bibasilar airspace opacities, most prominent in RML, small amt ascites within the abd / pelvis, aneurysmal dilation of the infrarenal abdominal aorta (unchanged from 2015), vague soft tissue edema within the upper pelvis, tracking about the small bowel loops, diffuse CAD, enlarged prostate ECHO 6/20 >> LV with mild concentric hypertrophy, LVEF 45-50%, diffuse hypokinesis, grade 2 diastolic dysfunction, PA peak 38 CT Chest 6/22 >> extensive patchy GGO and consolidation throughout both lungs, mild mediastinal LAN, small  effusions Bone Marrow Biopsy 6/23 >>   CULTURES: BCx2 6/19 >> negative Urine 6/19 >> negative U. Legionella 6/20 >> negative  RMSF 6/20 >> POSITIVE IgG PCP DFA 6/21 >> negative  BAL 6/21 >> negative HIV 6/21 >> neg BAL (RML) 6/24 >>   HEMATOLOGY: Folate >> 1164 B12 >> 998 Reticulocyte >> 32.1, 15.9 Peripheral Smear >> pancytopenia  RMSF >> IgG POSITIVE  ANTIBIOTICS: Ceftriaxone 6/19 >>6/26 Azithromycin 6/19 >> 6/20 Doxycycline 6/20 >>   SIGNIFICANT EVENTS: 6/19  admit with fever, ams, pancytopenia.  Transfused, intubated 6/20  Extubated.  6/21  Developed resp distress early am, lasix + biPAP.  Failed, reintubated  6/23  Bone arrow Bx 6/24  Resp distress, failed bipap. Intubated.    LINES/TUBES: ETT 6/19 >> 6/20, 6/21>> 6/23, 6/24 >>   DISCUSSION: 69 year old male presented with profound anemia, passed out, PNA, required intubation. Found to have pancytopenia requiring 4 units PRBC. Remained hypotensive. Initial concerns for septic shock.  He was empirically treated for CAP, AECOPD and RMSF with hx of tick bites.  Patient course complicated by recurrent respiratory failure.  Pancytopenia work up with BM Biopsy on 6/23, results pending.     ASSESSMENT / PLAN:  PULMONARY A: Acute Respiratory Distress - early am 6/21, initiated bipap Bilateral Ground Glass Opacities RLL PNA Inability to protect airway in setting AMS - resolved, extubated 6/20.  Failed 6/21, reintubated 6/24th COPD with acute exacerbation Tobacco Abuse - smoked since age 89, heaviest up to 1.5 ppd, now 5-6 cigs per day Possible CAP vs aspiration - appears rounded on CXR, more infiltrative on CT abd/pelvis L lung nodule from prior CT abdomen P:   On min support, wean sbt cpap 5 ps 5 goal 2 hr, increase ps if needed  pcxr in am  Scheduled Duonebs, PRN albuterol Continue Solumedrol '60mg'$  q12, consider redcution  CARDIOVASCULAR A:  Shock - resolved. Suspect septic vs hypovolmia/hemmorhagic  Chronic  diastolic CHF (Grade 1 DD) AAA 3.88x3.81 CM - unchanged from 2015 H/o CAD P:  Telemetry monitoring MAP goal > 39mHg  RENAL A:   AKI - resolving Hypok P:   k supp Saline to kvo was po last 24 hrs Chem in am   GASTROINTESTINAL A:   Ascites - small amt noted on CT, ? Inflammatory vs edema.  Acute hepatitis panel negative, LDH 1455 Protein Calorie Malnutrition P:   NPO Begin TF Pepcid for SUP  Trend LDH - variable  HEMATOLOGIC A:   Severe pancytopenia - etiology unclear, could be due to profound infection, has had tick bites in recent week (no rash on exam), concern for malignancy with lab disturbances (decreased appetite, mild weight loss).  S/P 4 units PRBC 6/19 Elevated LDH / anemia, query  intravascular hemolysis, MDS??   P:  PT/INR Hematology/Oncology consult appreciated Trend CBC F/U on bone marrow Bx (likely will take 7 days for results) Plat less 10 , consider plat tx  INFECTIOUS A:   SIRS/Sepsis - amylase / lipase negative, RMSF IgG positive.   ? CAP vs aspiration P:   Dc ceftriaxone day 8 Doxy maintain  ENDOCRINE A:   DM P:   Hold metformin, glipizide. CBG monitoring and SSI. Lantus 15 units QD Novolog 2 units Q6 for "meal coverage" while on TF Controlled overrall  NEUROLOGIC A:   Acute metabolic encephalopathy - resolved P:   Minimize sedating medications. PT efforts, mobilize. wua   FAMILY  - Updates:  No family bedside    GOC:  Patient & family reversed code status to FULL CODE.  We will need to re address this with status and reintubation abut no trach wished  Ccm time 30 min   Lavon Paganini. Titus Mould, MD, [place] Pgr: [place] [facility]

## 2015-08-02 ENCOUNTER — Inpatient Hospital Stay (HOSPITAL_COMMUNITY): Payer: Commercial Managed Care - HMO

## 2015-08-02 LAB — COMPREHENSIVE METABOLIC PANEL
ALBUMIN: 1.9 g/dL — AB (ref 3.5–5.0)
ALK PHOS: 50 U/L (ref 38–126)
ALT: 56 U/L (ref 17–63)
AST: 28 U/L (ref 15–41)
Anion gap: 11 (ref 5–15)
BILIRUBIN TOTAL: 0.8 mg/dL (ref 0.3–1.2)
BUN: 46 mg/dL — AB (ref 6–20)
CALCIUM: 8.1 mg/dL — AB (ref 8.9–10.3)
CO2: 29 mmol/L (ref 22–32)
Chloride: 99 mmol/L — ABNORMAL LOW (ref 101–111)
Creatinine, Ser: 1.14 mg/dL (ref 0.61–1.24)
GFR calc Af Amer: >60 mL/min (ref >60)
GFR calc non Af Amer: >60 mL/min (ref >60)
GLUCOSE: 272 mg/dL — AB (ref 65–99)
Potassium: 3.8 mmol/L (ref 3.5–5.1)
SODIUM: 139 mmol/L (ref 135–145)
TOTAL PROTEIN: 5.2 g/dL — AB (ref 6.5–8.1)

## 2015-08-02 LAB — CBC WITH DIFFERENTIAL/PLATELET
BAND NEUTROPHILS: 0 %
BASOS ABS: 0 10*3/uL (ref 0.0–0.1)
BASOS PCT: 0 %
BLASTS: 0 %
EOS ABS: 0 10*3/uL (ref 0.0–0.7)
Eosinophils Relative: 0 %
HEMATOCRIT: 27.7 % — AB (ref 39.0–52.0)
Hemoglobin: 8.9 g/dL — ABNORMAL LOW (ref 13.0–17.0)
LYMPHS ABS: 12.5 10*3/uL — AB (ref 0.7–4.0)
LYMPHS PCT: 74 %
MCH: 29.7 pg (ref 26.0–34.0)
MCHC: 32.1 g/dL (ref 30.0–36.0)
MCV: 92.3 fL (ref 78.0–100.0)
METAMYELOCYTES PCT: 0 %
Monocytes Absolute: 3.9 10*3/uL — ABNORMAL HIGH (ref 0.1–1.0)
Monocytes Relative: 23 %
Myelocytes: 0 %
NEUTROS ABS: 0.5 10*3/uL — AB (ref 1.7–7.7)
Neutrophils Relative %: 3 %
PROMYELOCYTES ABS: 0 %
Platelets: 17 10*3/uL — CL (ref 150–400)
RBC: 3 MIL/uL — ABNORMAL LOW (ref 4.22–5.81)
RDW: 20 % — AB (ref 11.5–15.5)
WBC: 16.9 10*3/uL — ABNORMAL HIGH (ref 4.0–10.5)
nRBC: 0 /100 WBC

## 2015-08-02 LAB — GLUCOSE, CAPILLARY
GLUCOSE-CAPILLARY: 149 mg/dL — AB (ref 65–99)
GLUCOSE-CAPILLARY: 238 mg/dL — AB (ref 65–99)
GLUCOSE-CAPILLARY: 268 mg/dL — AB (ref 65–99)
Glucose-Capillary: 232 mg/dL — ABNORMAL HIGH (ref 65–99)

## 2015-08-02 LAB — PREPARE PLATELET PHERESIS: Unit division: 0

## 2015-08-02 LAB — TRIGLYCERIDES: Triglycerides: 106 mg/dL (ref <150)

## 2015-08-02 LAB — CULTURE, BAL-QUANTITATIVE W GRAM STAIN
Culture: NORMAL
Special Requests: NORMAL

## 2015-08-02 LAB — CULTURE, BAL-QUANTITATIVE

## 2015-08-02 MED ORDER — SODIUM CHLORIDE 0.9 % IV SOLN
25.0000 ug/h | INTRAVENOUS | Status: DC
  Administered 2015-08-03: 100 ug/h via INTRAVENOUS
  Filled 2015-08-02: qty 50

## 2015-08-02 NOTE — Progress Notes (Signed)
Physical Therapy Discharge Patient Details Name: Arthur Evans. MRN: [id number] DOB: 10/03/1945 Today's Date: 08/02/2015 Time:  -     Patient discharged from PT services secondary to medical decline - will need to re-order PT to resume therapy services.  Please see latest therapy progress note for current level of functioning and progress toward goals.    Progress and discharge plan discussed with patient and/or caregiver: Patient unable to participate in discharge planning and no caregivers available  GP     [facility] 08/02/2015, 8:08 AM  [facility] [phone]

## 2015-08-02 NOTE — Procedures (Signed)
Extubation Procedure Note  Patient Details:   Name: Arthur Evans. DOB: 1945-07-28 MRN: [id number]   Airway Documentation:     Evaluation  Pt terminally extubated.   Mariam Dollar 08/02/2015, 1:03 PM

## 2015-08-02 NOTE — Progress Notes (Signed)
Nutrition Brief Note  Chart reviewed. Pt now transitioning to comfort care.  No further nutrition interventions warranted at this time.  Please re-consult as needed.    Kimberly Harris, RD, LDN, CNSC Pager [phone] After Hours Pager [phone]    

## 2015-08-02 NOTE — Progress Notes (Signed)
PULMONARY / CRITICAL CARE MEDICINE   Name: Arthur Evans. MRN: [id number] DOB: Apr 17, 1945    ADMISSION DATE:  07/21/2015 CONSULTATION DATE:  6/19  REFERRING MD:  Dr. Jeanell Sparrow EDP  CHIEF COMPLAINT:  AMS  HISTORY OF PRESENT ILLNESS:  70 year old male with PMH as below, which includes AAA, DM on metformin, MI, DVT, and seizure disorder (thought due to hypoglycemia.  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 He presented to [facility] ED 6/19 with complaints of generalized weakness for 4 days. In ED he was found to be confused, febrile to 102.3 and hypotensive. He denied and symptoms of GI bleed, but hemoglobin was found to be 4.3. He required intubation for airway protection. He was transfused 2 units of emergent release blood and PCCM was called for admission.   SUBJECTIVE:   Remains on vent, brady  VITAL SIGNS: BP 98/60 mmHg  Pulse 104  Temp(Src) 98.8 F (37.1 C) (Oral)  Resp 23  Ht '5\' 8"'$  (1.727 m)  Wt 135 lb 2.3 oz (61.3 kg)  BMI 20.55 kg/m2  SpO2 100%  HEMODYNAMICS:    VENTILATOR SETTINGS: Vent Mode:  [-] PSV FiO2 (%):  [40 %] 40 % Set Rate:  [18 bmp] 18 bmp Vt Set:  [560 mL] 560 mL PEEP:  [5 cmH20] 5 cmH20 Pressure Support:  [5 cmH20] 5 cmH20 Plateau Pressure:  [11 cmH20-23 cmH20] 19 cmH20  INTAKE / OUTPUT: I/O last 3 completed shifts: In: 4446.2 [I.V.:1739; NG/GT:1857.3; IV Piggyback:850] Out: 0272 [Urine:1460]  PHYSICAL EXAMINATION: General: elderly male in NAD on vent Neuro: sedated on vent, rass -2 HEENT: PERRL 93m, pale conjunctiva Cardiovascular: RBR, no MRG, +2 pulses Lungs: coarse R>L Abdomen: Soft, non-distended, BS+ Musculoskeletal: No acute deformity or ROM limitation. Skin: Grossly intact, skin warm and dry.  LABS:  BMET  Recent Labs Lab 07/31/15 0500 08/01/15 0442 08/02/15 0413  NA 141 141 139  K  3.8 3.3* 3.8  CL 100* 103 99*  CO2 '30 29 29  '$ BUN 33* 34* 46*  CREATININE 1.31* 1.08 1.14  GLUCOSE 229* 186* 272*   Electrolytes  Recent Labs Lab 07/31/15 0500  07/31/15 1640 08/01/15 0442 08/01/15 1656 08/02/15 0413  CALCIUM 7.9*  --   --  7.7*  --  8.1*  MG  --   < > 1.9 2.0 1.9  --   PHOS  --   < > 3.2 2.4* 2.5  --   < > = values in this interval not displayed.  CBC  Recent Labs Lab 08/01/15 0442 08/01/15 0939 08/02/15 0413  WBC 9.4 11.8* 16.9*  HGB 9.1* 8.9* 8.9*  HCT 28.7* 28.3* 27.7*  PLT 9* 28* 17*   Coag's  Recent Labs Lab 07/28/15 0336  APTT 32   Sepsis Markers  Recent Labs Lab 07/26/15 1508 07/27/15 0436 07/28/15 0336  PROCALCITON 5.09 4.33 3.71   ABG  Recent Labs Lab 07/29/15 0939 07/30/15 0340 07/30/15 2130  PHART 7.381 7.470* 7.471*  PCO2ART 40.9 42.3 42.7  PO2ART 97.8 57.4* 119*   Liver Enzymes  Recent Labs Lab 07/27/15 0843 07/28/15 0336 08/02/15 0413  AST <5* 100* 28  ALT 442* 247* 56  ALKPHOS 52 41 50  BILITOT 0.9 1.7* 0.8  ALBUMIN 2.3* 1.9* 1.9*   Cardiac Enzymes  Recent Labs Lab 07/27/15 0843  TROPONINI 0.23*  Glucose  Recent Labs Lab 08/01/15 1135 08/01/15 1553 08/01/15 1953 08/02/15 0025 08/02/15 0413 08/02/15 0817  GLUCAP 249* 170* 171* 238* 268* 232*   Imaging Dg Chest Port 1 View  08/02/2015  CLINICAL DATA:  Pneumonia. EXAM: PORTABLE CHEST 1 VIEW COMPARISON:  08/01/2015. FINDINGS: Endotracheal tube and NG tube in stable position. Diffuse right lung and left upper lobe infiltrates again noted. No interim change . No pleural effusion or pneumothorax. Prior thoracolumbar spine fusion. IMPRESSION: 1. Lines and tubes in stable position . 2. Diffuse right lung and left upper lobe infiltrates. No interim change. Electronically Signed   By: Marcello Moores  Register   On: 08/02/2015 07:31    STUDIES:  CT abd 6/19 >> patchy bibasilar airspace opacities, most prominent in RML, small amt ascites within the abd /  pelvis, aneurysmal dilation of the infrarenal abdominal aorta (unchanged from 2015), vague soft tissue edema within the upper pelvis, tracking about the small bowel loops, diffuse CAD, enlarged prostate ECHO 6/20 >> LV with mild concentric hypertrophy, LVEF 45-50%, diffuse hypokinesis, grade 2 diastolic dysfunction, PA peak 38 CT Chest 6/22 >> extensive patchy GGO and consolidation throughout both lungs, mild mediastinal LAN, small effusions Bone Marrow Biopsy 6/23 >> Acute Leukemia   CULTURES: BCx2 6/19 >> negative Urine 6/19 >> negative U. Legionella 6/20 >> negative  RMSF 6/20 >> POSITIVE IgG PCP DFA 6/21 >> negative  BAL 6/21 >> negative HIV 6/21 >> neg BAL (RML) 6/24 >>   HEMATOLOGY: Folate >> 1164 B12 >> 998 Reticulocyte >> 32.1, 15.9 Peripheral Smear >> pancytopenia  RMSF >> IgG POSITIVE  ANTIBIOTICS: Ceftriaxone 6/19 >>6/26 Azithromycin 6/19 >> 6/20 Doxycycline 6/20 >>   SIGNIFICANT EVENTS: 6/19  admit with fever, ams, pancytopenia.  Transfused, intubated 6/20  Extubated.  6/21  Developed resp distress early am, lasix + biPAP.  Failed, reintubated  6/23  Bone arrow Bx 6/24  Resp distress, failed bipap. Intubated.   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 Made full DNR 6/27 passed sbt. Ready for extubation.   LINES/TUBES: ETT 6/19 >> 6/20, 6/21>> 6/23, 6/24 >>   DISCUSSION: 70 year old male presented with profound anemia, passed out, PNA, required intubation. BMBx c/w acute leukemia. Now passing SBT. Will plan on extubation and make all efforts to ensure focused comfort care.   ASSESSMENT / PLAN:  PULMONARY A: Acute hypoxic respiratory failure in setting of bilateral pulmonary infiltrates; CAP vs aspiration COPD with acute exacerbation Inability to protect airway in setting AMS - resolved, extubated 6/20.  Failed 6/21, reintubated 6/24th Tobacco Abuse - smoked since age 38, heaviest up to 1.5 ppd, now 5-6 cigs per day ->passing SBT, think we need to take  advantage of this in effort to give the pt and family the most quality time we can.  P:   Extubate today (6/27) Scheduled Duonebs, PRN albuterol Continue Solumedrol '60mg'$  q12  CARDIOVASCULAR A:  Shock - resolved. Suspect septic vs hypovolmia/hemmorhagic  Chronic diastolic CHF (Grade 1 DD) AAA 3.88x3.81 CM - unchanged from 2015 H/o CAD P:  Telemetry monitoring MAP goal > 70mHg  RENAL A:   AKI - resolving Hypok P:   k supp Saline to kvo was po last 24 hrs Chem in am   GASTROINTESTINAL A:   Ascites - small amt noted on CT, ? Inflammatory vs edema.  Acute hepatitis panel negative, LDH 1455 Protein Calorie Malnutrition P:   NPO Pepcid for SUP   HEMATOLOGIC A:   Severe pancytopenia - in setting of Acute  Leukemia Elevated LDH / anemia, query intravascular hemolysis, MDS??   P:  PT/INR Hematology/Oncology consult appreciated Trend CBC No further tranfusions  INFECTIOUS A:   SIRS/Sepsis - amylase / lipase negative, RMSF IgG positive.   ? CAP vs aspiration P:   Doxy maintain for now   ENDOCRINE A:   DM P:   Hold metformin, glipizide. CBG monitoring and SSI. Lantus 15 units QD  NEUROLOGIC A:   Acute metabolic encephalopathy - resolved P:   Minimize sedating medications. PT efforts, mobilize. wua   FAMILY  - Updates:  No family bedside    GOC: Now full DNR. Working towards extubation. Will transition to comfort.   Erick Colace ACNP-BC [place] Pager # [phone] OR # [phone] if no answer   STAFF NOTE: I, Merrie Roof, MD FACP have personally reviewed patient's available data, including medical history, events of note, physical examination and test results as part of my evaluation. I have discussed with resident/NP and other care providers such as pharmacist, RN and RRT. In addition, I personally evaluated patient and elicited key findings of: awake, alert, fc on vent, pcxr slight improved, DNR,. 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   Critical Care Time devoted to patient care services described in this note is 30 Minutes. This time reflects time of care of this signee: Merrie Roof, MD FACP. This critical care time does not reflect procedure time, or teaching time or supervisory time of PA/NP/Med student/Med Resident etc but could involve care discussion time. Rest per NP/medical resident whose note is outlined above and that I agree with   Lavon Paganini. Titus Mould, MD, [place] Pgr: [facility] 08/02/2015 10:49 AM

## 2015-08-02 NOTE — Progress Notes (Signed)
[place] Progress Note Patient Name: Jahem Deleeuw. DOB: 01/27/1946 MRN: [id number]   Date of Service  08/02/2015  HPI/Events of Note  Multiple issues: 1. Patient is comfort care and needs transfer to med-surg bed d/t ICU bed shortage. 2. Fentanyl IV infusion order expired.   eICU Interventions  Will order: 1. Transfer to med-surg bed. 2. Fentanyl IV infusion. Titrate to comfort.      Intervention Category Minor Interventions: Routine modifications to care plan (e.g. PRN medications for pain, fever)  Sommer,Steven Eugene 08/02/2015, 11:52 PM

## 2015-08-03 ENCOUNTER — Telehealth: Payer: Self-pay | Admitting: Cardiology

## 2015-08-03 DIAGNOSIS — C9200 Acute myeloblastic leukemia, not having achieved remission: Secondary | ICD-10-CM

## 2015-08-03 LAB — GLUCOSE, CAPILLARY: Glucose-Capillary: 69 mg/dL (ref 65–99)

## 2015-08-03 MED ORDER — MORPHINE SULFATE (PF) 2 MG/ML IV SOLN
2.0000 mg | INTRAVENOUS | Status: DC | PRN
  Administered 2015-08-03: 2 mg via INTRAVENOUS
  Filled 2015-08-03: qty 1

## 2015-08-03 MED ORDER — MIDAZOLAM HCL 2 MG/2ML IJ SOLN
1.0000 mg | Freq: Once | INTRAMUSCULAR | Status: AC
  Administered 2015-08-03: 1 mg via INTRAVENOUS
  Filled 2015-08-03: qty 2

## 2015-08-03 MED ORDER — SODIUM CHLORIDE 0.9 % IV SOLN
10.0000 mg/h | INTRAVENOUS | Status: DC
  Administered 2015-08-03: 10 mg/h via INTRAVENOUS
  Filled 2015-08-03: qty 10

## 2015-08-03 NOTE — Progress Notes (Signed)
Patient seems comfortable,family at bedside and no request at this time.

## 2015-08-03 NOTE — Telephone Encounter (Signed)
HOSPICE  NOTIFIED .Adonis Housekeeper

## 2015-08-03 NOTE — Care Management Important Message (Signed)
Important Message  Patient Details  Name: Arthur Evans. MRN: [id number] Date of Birth: [birth year]   Medicare Important Message Given:  Yes    Loann Quill 08/03/2015, 10:18 AM

## 2015-08-03 NOTE — Telephone Encounter (Signed)
Yes - that would be fine

## 2015-08-03 NOTE — Telephone Encounter (Signed)
New Message:   Pt is being discharged from the hospital tomorrow.Pt will be going home with Hospice. Pt have requested that Dr Aundra Dubin would be the Attending of Records.Will Dr Aundra Dubin do this? If so,will he want to use the standard orders from Hospice?

## 2015-08-03 NOTE — Progress Notes (Signed)
Transferred pt to 6E21 via bed.  Receiving RN at bedside. Vitals and assessment stable at transport, see flow sheets

## 2015-08-03 NOTE — Progress Notes (Signed)
Attempted Report to 6E. 

## 2015-08-03 NOTE — Care Management Note (Signed)
Case Management Note  Patient Details  Name: Arthur Evans. MRN: [id number] Date of Birth: 1945-07-25  Subjective/Objective:        CM following for progression and d/c planning.             Action/Plan: 6/28/207 Met with pt and son re d/c planning. Son currently requesting home hospice services. Choices offered for hospice home services, son selected [facility] [place] . [facility] notified and will see pt and son in pt room. Per MD this pt is ready for d/c as soon as home services are arranged. This CM will update hospice rep on earlier request for A/C and issues with pt hoarding as this may effect home hospice services.   Expected Discharge Date:    08/03/2015              Expected Discharge Plan:  Home w Hospice Care  In-House Referral:     Discharge planning Services  CM Consult  Post Acute Care Choice:    Choice offered to:     DME Arranged:    DME Agency:     HH Arranged:    [place] Agency:     Status of Service:  In process, will continue to follow  If discussed at Long Length of Stay Meetings, dates discussed:    Additional Comments:  Adron Bene, RN 08/03/2015, 10:31 AM

## 2015-08-03 NOTE — Progress Notes (Signed)
PULMONARY / CRITICAL CARE MEDICINE   Name: Arthur Evans. MRN: [id number] DOB: April 15, 1945    ADMISSION DATE:  07/18/2015 CONSULTATION DATE:  6/19  REFERRING MD:  Dr. Jeanell Sparrow EDP  CHIEF COMPLAINT:  AMS  HISTORY OF PRESENT ILLNESS:  70 year old male with PMH as below, which includes AAA, DM on metformin, MI, DVT, and seizure disorder (thought due to hypoglycemia.  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 He presented to [facility] ED 6/19 with complaints of generalized weakness for 4 days. In ED he was found to be confused, febrile to 102.3 and hypotensive. He denied and symptoms of GI bleed, but hemoglobin was found to be 4.3. He required intubation for airway protection. He was transfused 2 units of emergent release blood and PCCM was called for admission.   SUBJECTIVE:    Currently on non rebreather mask and dyspneic.  2 sons currently at bedside pt requesting he would like to go home.  Son stated he would be available to take care of pt if pt discharged with hospice.  VITAL SIGNS: BP 144/61 mmHg  Pulse 136  Temp(Src) 98.3 F (36.8 C) (Oral)  Resp 25  Ht '5\' 8"'$  (1.727 m)  Wt 135 lb 2.3 oz (61.3 kg)  BMI 20.55 kg/m2  SpO2 98%  HEMODYNAMICS:    VENTILATOR SETTINGS:    INTAKE / OUTPUT: I/O last 3 completed shifts: In: 2225.9 [I.V.:605.9; NG/GT:1320; IV Piggyback:300] Out: 2520 [Urine:2520]  PHYSICAL EXAMINATION: General: elderly male in NAD on vent Neuro: alert and oriented, follows commands HEENT: PERRL 10m, pale conjunctiva Cardiovascular: RBR, no MRG, +2 pulses Lungs: coarse throughout, dyspneic at rest Abdomen: Soft, non-distended, +BS x 4 Musculoskeletal: No acute deformity or ROM limitation. Skin: Grossly intact, skin warm and dry.  Imaging No results found.  STUDIES:  CT abd 6/19 >> patchy bibasilar airspace  opacities, most prominent in RML, small amt ascites within the abd / pelvis, aneurysmal dilation of the infrarenal abdominal aorta (unchanged from 2015), vague soft tissue edema within the upper pelvis, tracking about the small bowel loops, diffuse CAD, enlarged prostate ECHO 6/20 >> LV with mild concentric hypertrophy, LVEF 45-50%, diffuse hypokinesis, grade 2 diastolic dysfunction, PA peak 38 CT Chest 6/22 >> extensive patchy GGO and consolidation throughout both lungs, mild mediastinal LAN, small effusions Bone Marrow Biopsy 6/23 >> Acute Leukemia   CULTURES: BCx2 6/19 >> negative Urine 6/19 >> negative U. Legionella 6/20 >> negative  RMSF 6/20 >> POSITIVE IgG PCP DFA 6/21 >> negative  BAL 6/21 >> negative HIV 6/21 >> neg BAL (RML) 6/24 >>   HEMATOLOGY: Folate >> 1164 B12 >> 998 Reticulocyte >> 32.1, 15.9 Peripheral Smear >> pancytopenia  RMSF >> IgG POSITIVE  ANTIBIOTICS: Ceftriaxone 6/19 >>6/26 Azithromycin 6/19 >> 6/20 Doxycycline 6/20 >> 6/27  SIGNIFICANT EVENTS: 6/19  admit with fever, ams, pancytopenia.  Transfused, intubated 6/20  Extubated.  6/21  Developed resp distress early am, lasix + biPAP.  Failed, reintubated  6/23  Bone arrow Bx 6/24  Resp distress, failed bipap. Intubated.   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 Made full DNR 6/27 passed sbt. Ready for extubation.   LINES/TUBES: ETT 6/19 >> 6/20, 6/21>> 6/23, 6/24 >>   DISCUSSION: 70year old male presented with profound anemia, passed out, PNA, required intubation. BMBx c/w acute leukemia. Now passing  SBT. Will plan on extubation and make all efforts to ensure focused comfort care.   ASSESSMENT / PLAN:  A: Acute hypoxic respiratory failure in setting of bilateral pulmonary infiltrates; CAP vs aspiration COPD with acute exacerbation Inability to protect airway in setting AMS - resolved, extubated 6/20.  Failed 6/21, reintubated 6/24th Tobacco Abuse - smoked since age 12, heaviest up to 1.5 ppd,  now 5-6 cigs per day Shock - resolved. Suspect septic vs hypovolmia/hemmorhagic  Chronic diastolic CHF (Grade 1 DD) Severe pancytopenia - in setting of Acute Leukemia Elevated LDH / anemia, query intravascular hemolysis, MDS??  AKI - resolving Hypok PLAN:   -Transition to morphine drip  -Comfort care only will transition to hospice -Supplemental O2 as needed for dyspnea or air hunger -Continue Solumedrol '60mg'$  q12 for now    FAMILY  - Updates: 2 sons at bedside and updated about plan of care.  Awaiting Hospice recommendations to determine if pt can go home with hospice due to living conditions or would it be more beneficial if pt is discharged to residential hospice.  Pt and family preference is for pt to be d/c home.  Marda Stalker, AGNP  Pulmonary/Critical Care

## 2015-08-03 NOTE — Progress Notes (Signed)
Patient without pain. Respirations decreased,less alert. Arouses to voice. Family at bedside.

## 2015-08-03 NOTE — Telephone Encounter (Signed)
I returned call to Hospice and spoke with Amy. I told her Dr. Aundra Dubin has agreed to be attending and OK to use standard Hospice orders.

## 2015-08-03 NOTE — Progress Notes (Signed)
[facility] Progress Note Patient Name: Arthur Evans. DOB: 11-22-1945 MRN: [id number]   Date of Service  08/03/2015  HPI/Events of Note  Anxiety  eICU Interventions  Versed 1 mg IV now.      Intervention Category Minor Interventions: Agitation / anxiety - evaluation and management  Dai Apel Eugene 08/03/2015, 3:58 AM

## 2015-08-03 NOTE — Telephone Encounter (Signed)
PT  ENTERING   HOSPICE   WILL ASK   DR  MCLEAN  IF   WILL ORDER .Arthur Evans

## 2015-08-03 NOTE — Progress Notes (Signed)
Late entry                                                                                                                                                                                                                                      Received to room 6e21,family at bedside and questions answered.Oriented patient to call bell,and discussed pain med regimine and oxygen settings for comfort.Son and daughter with questions regarding d/c to home and living conditions not good. Verbalized to family that case manager and  Hospice/Palliative Care will answer all questions this am.Patient stating " I want to go home to die",and family keep stating that conditions are not good at home.

## 2015-08-03 NOTE — Progress Notes (Signed)
Notified by Jasmine Pang  CMRN of family request for [facility] services at home after discharge. Chart and patient information currently under review to confirm hospice eligibility.   Spoke with the patient and son BJ and Daughter Larene Beach at bedside to initiate education related to hospice philosophy, services and team approach to care. Family verbalized understanding of the information provided. Per discussion it is the patient's desire that if he is going to die he wants to die at home and his desire is to go home as soon as possible. There was some discussion about [employer], however patient is adamant that he wants to be at home.  Noted that there are concerns about the home environment as patient does not have air conditioning in the home, and the home is quite cluttered.   Son states they are in the process of having window units installed, and he plans to clear space for a hospital bed.  Son is planning to stay with the patient.    The patient is currently on a Morphine drip.  If patient is to go home with morphine infusion will need to coordinate with [facility] rep to obtain Cadd pump and hook up prior to discharge from hospital.  Would be helpful for patient to have Picc line placed prior to discharge for continued Morphine infusion.   Discussed with Dr. Elsworth Soho.   Will work on plans  for Picc placement with discharge scheduled for tomorrow.    Please send signed completed DNR form home with patient.   Patient will need prescriptions for discharge comfort medications.  DME needs discussed and family requested hospital bed, OBT,  Oxygen concentrator with NRB at 15 L .   [facility] equipment manager Jewel Ysidro Evert notified and will contact [facility] to arrange delivery to the home as soon as possible.   The home address has been verified and is correct in the chart; son BJ is the family member to be contacted to arrange time of delivery.   [facility]erral [facility] aware of the above.    Completed discharge summary will need to be faxed to [facility] at [phone] when final.  Please notify [facility] when patient is ready to leave unit at discharge-call [phone].  [facility] information and contact numbers have been given to the patient and son  during visit.  Above information shared with [facility], CMRN.  Please call with any questions.   Mickie Kay, [place]  [phone]

## 2015-08-03 NOTE — Progress Notes (Addendum)
Fentanyl drip d/c'd .Wasted remaining in sink. Loleta Chance ,RN witnessed.

## 2015-08-06 NOTE — Progress Notes (Signed)
Called to room ,patient pulseless , breathless and family at bedside.Pronounced by 2 RN'S and will notify MD on call.Emotional support given to family.

## 2015-08-06 NOTE — Progress Notes (Signed)
MD Simonds on call,info given and condolences relayed to family.Will notify Case manager/hospice in am.

## 2015-08-06 DEATH — deceased

## 2015-08-08 LAB — CHROMOSOME ANALYSIS, BONE MARROW

## 2015-08-10 ENCOUNTER — Telehealth: Payer: Self-pay

## 2015-08-10 NOTE — Telephone Encounter (Signed)
On 18-Aug-2015 I received a death certificate from [place] (original). The death certificate is for cremation. The patient is a patient of Doctor Elsworth Soho. The death certificate will be taken to [employer] this am for signature. On 08/18/15 I received the death certificate back from Doctor Elsworth Soho. I got the death certificate ready and called the funeral home to let them know the death certificate is ready for pickup. I also faxed a copy per their request.

## 2015-09-06 NOTE — Discharge Summary (Signed)
PULMONARY / CRITICAL CARE MEDICINE   Name: Arthur Evans. MRN: [id number] DOB: 1945/02/07    ADMISSION DATE:  07/13/2015 CONSULTATION DATE:  6/19  REFERRING MD:  Dr. Jeanell Sparrow EDP  CHIEF COMPLAINT:  AMS  HISTORY OF PRESENT ILLNESS:  70 year old male with PMH as below, which includes AAA, DM on metformin, MI, DVT, and seizure disorder (thought due to hypoglycemia.  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 He presented to [facility] ED 6/19 with complaints of generalized weakness for 4 days. In ED he was found to be confused, febrile to 102.3 and hypotensive. He denied and symptoms of GI bleed, but hemoglobin was found to be 4.3. He required intubation for airway protection. He was transfused 2 units of emergent release blood and PCCM was called for admission.    STUDIES:  CT abd 6/19 >> patchy bibasilar airspace opacities, most prominent in RML, small amt ascites within the abd / pelvis, aneurysmal dilation of the infrarenal abdominal aorta (unchanged from 2015), vague soft tissue edema within the upper pelvis, tracking about the small bowel loops, diffuse CAD, enlarged prostate ECHO 6/20 >> LV with mild concentric hypertrophy, LVEF 45-50%, diffuse hypokinesis, grade 2 diastolic dysfunction, PA peak 38 CT Chest 6/22 >> extensive patchy GGO and consolidation throughout both lungs, mild mediastinal LAN, small effusions Bone Marrow Biopsy 6/23 >> Acute Leukemia   CULTURES: BCx2 6/19 >> negative Urine 6/19 >> negative U. Legionella 6/20 >> negative  RMSF 6/20 >> POSITIVE IgG PCP DFA 6/21 >> negative  BAL 6/21 >> negative HIV 6/21 >> neg BAL (RML) 6/24 >> ng  HEMATOLOGY: Folate >> 1164 B12 >> 998 Reticulocyte >> 32.1, 15.9 Peripheral Smear >> pancytopenia  RMSF >> IgG POSITIVE  ANTIBIOTICS: Ceftriaxone 6/19 >>6/26 Azithromycin 6/19 >>  6/20 Doxycycline 6/20 >> 6/27  SIGNIFICANT EVENTS: 6/19  admit with fever, ams, pancytopenia.  Transfused, intubated 6/20  Extubated.  6/21  Developed resp distress early am, lasix + biPAP.  Failed, reintubated  6/23  Bone arrow Bx 6/24  Resp distress, failed bipap. Intubated.   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 Made full DNR 6/27 passed sbt. Ready for extubation.   LINES/TUBES: ETT 6/19 >> 6/20, 6/21>> 6/23, 6/24 >> 6/27     ASSESSMENT / PLAN:  A: Acute hypoxic respiratory failure in setting of bilateral pulmonary infiltrates; CAP vs aspiration COPD with acute exacerbation Inability to protect airway in setting AMS - resolved, extubated 6/20.  Failed 6/21, reintubated 6/24th Tobacco Abuse - smoked since age 21, heaviest up to 1.5 ppd, now 5-6 cigs per day Shock - resolved. Suspect septic vs hypovolmia/hemmorhagic  Chronic diastolic CHF (Grade 1 DD) Severe pancytopenia - in setting of Acute Leukemia Elevated LDH / anemia, query intravascular hemolysis, MDS??  AKI - resolving Hypok PLAN:   -Transitioned to morphine drip  -Comfort care  -Supplemental O2 as needed for dyspnea or air hunger    COURSE : 70 year old male presented with profound anemia, passed out, PNA, required intubation. BMBx c/w acute leukemia.Was extubated to comfort & passed away while awaiting hospice placement  Cause of death _ Acute respiratory failure, COPD exacerbation, Acute myeloid leukaemia  Kara Mead MD. FCCP. [facility]    08/11/2015

## 2015-09-13 ENCOUNTER — Encounter (HOSPITAL_COMMUNITY): Payer: Self-pay

## 2015-10-13 ENCOUNTER — Other Ambulatory Visit (HOSPITAL_COMMUNITY): Payer: Commercial Managed Care - HMO

## 2015-10-13 ENCOUNTER — Ambulatory Visit: Payer: Commercial Managed Care - HMO | Admitting: Family

## 2016-01-04 ENCOUNTER — Ambulatory Visit: Payer: Commercial Managed Care - HMO | Admitting: Neurology

## 2016-09-07 IMAGING — CR DG CHEST 1V PORT
1 series · 1 of 1 positions shown · non-contrast
Comparison: 09/22/2012.

CLINICAL DATA: Loss of consciousness. Cervicalgia/neck pain. Fall.

EXAM:
PORTABLE CHEST - 1 VIEW

[AP]
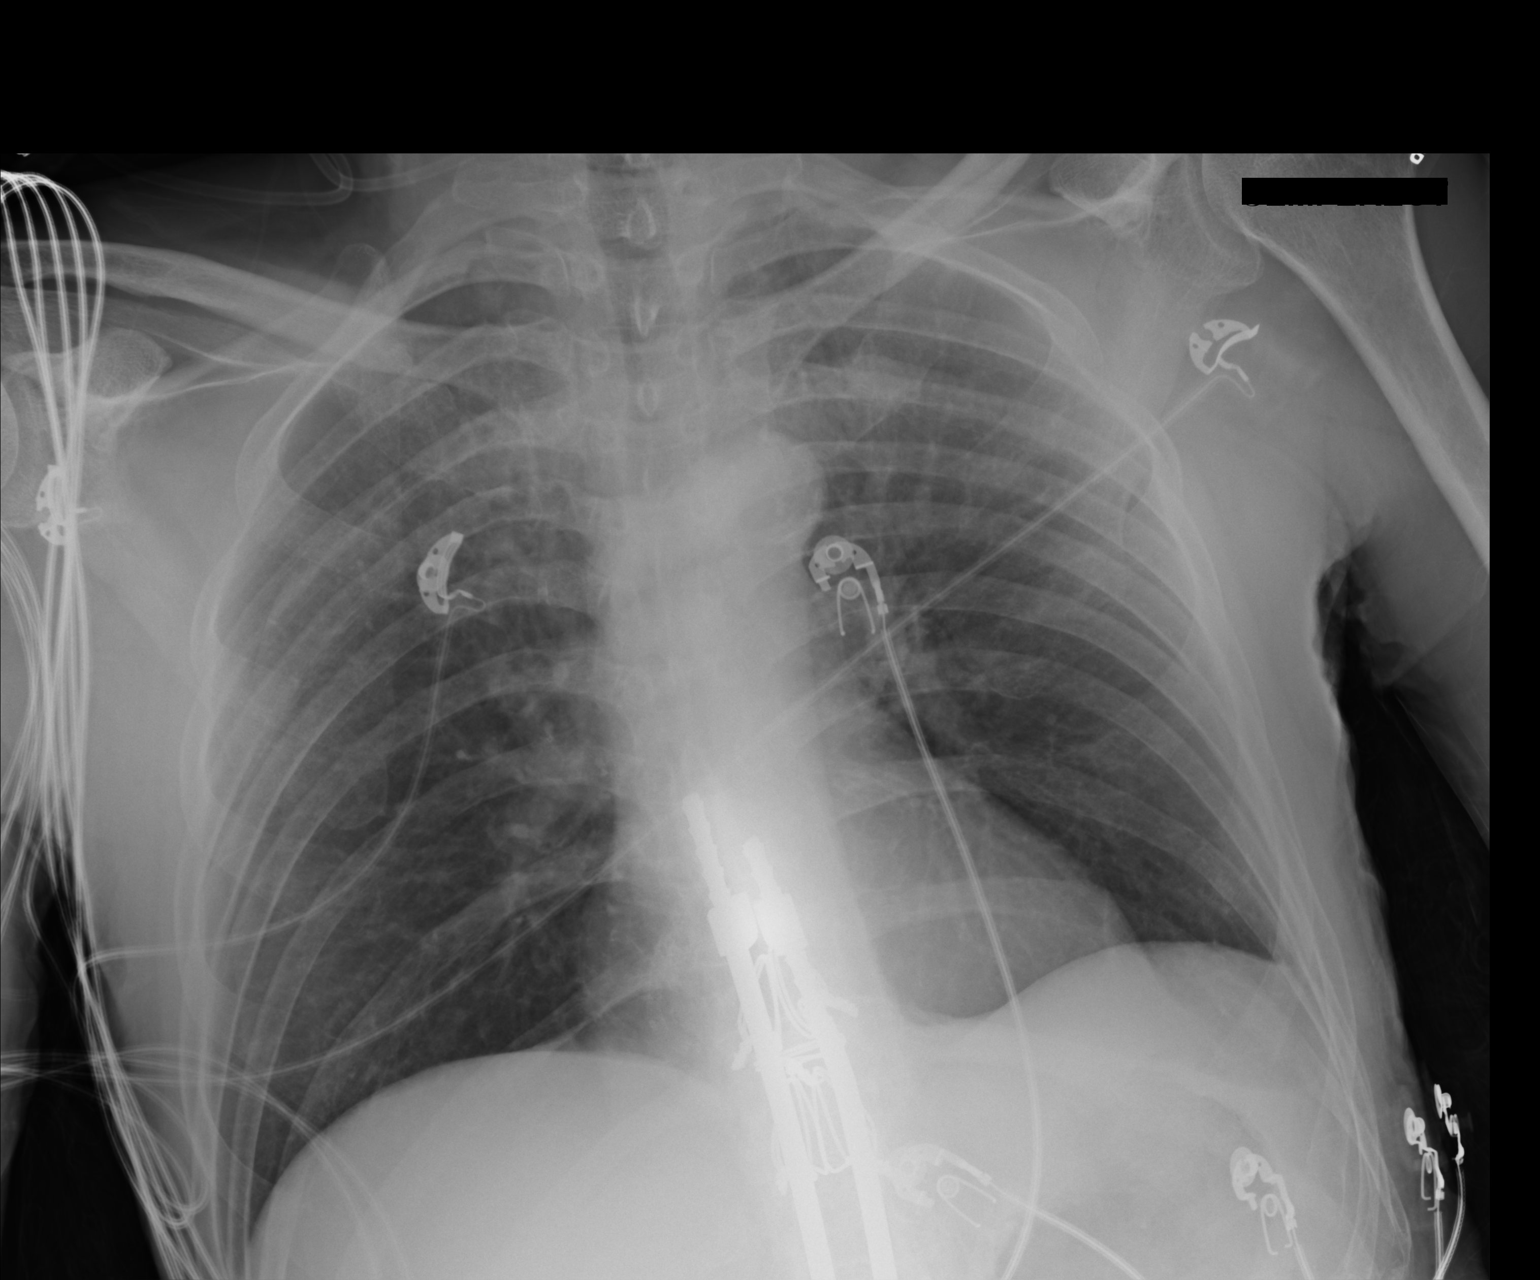

[1 of 1 positions shown; findings below may reference images not displayed]

FINDINGS: Cardiopericardial silhouette within normal limits. Mediastinal
contours normal. Trachea midline. No airspace disease or effusion.
Harrington rods are present. Monitoring leads project over the
chest. Asymmetric LEFT pleural apical scarring. No displaced rib
fracture. Negative for pneumothorax. Skin fold projects over the
RIGHT chest.
IMPRESSION: No active disease.

## 2016-09-18 IMAGING — CR DG SHOULDER 2+V*R*
4 series · 4 of 4 positions shown · non-contrast
Comparison: None.

CLINICAL DATA: Right shoulder pain.  Initial evaluation .

EXAM:
RIGHT SHOULDER - 2+ VIEW

[view not recorded (1 of 4)]
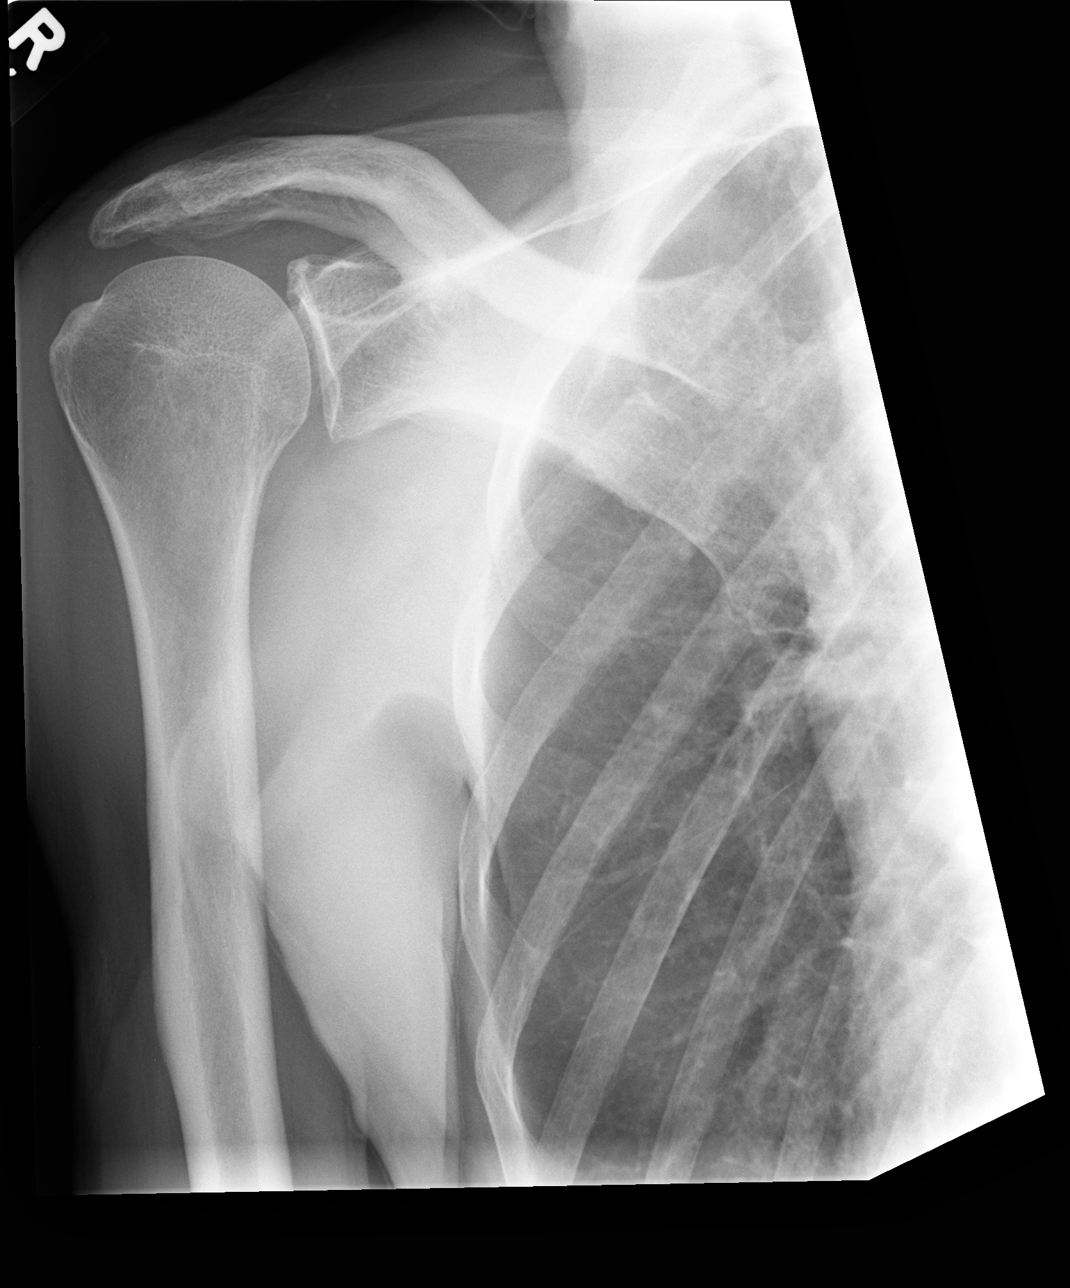

[view not recorded (2 of 4)]
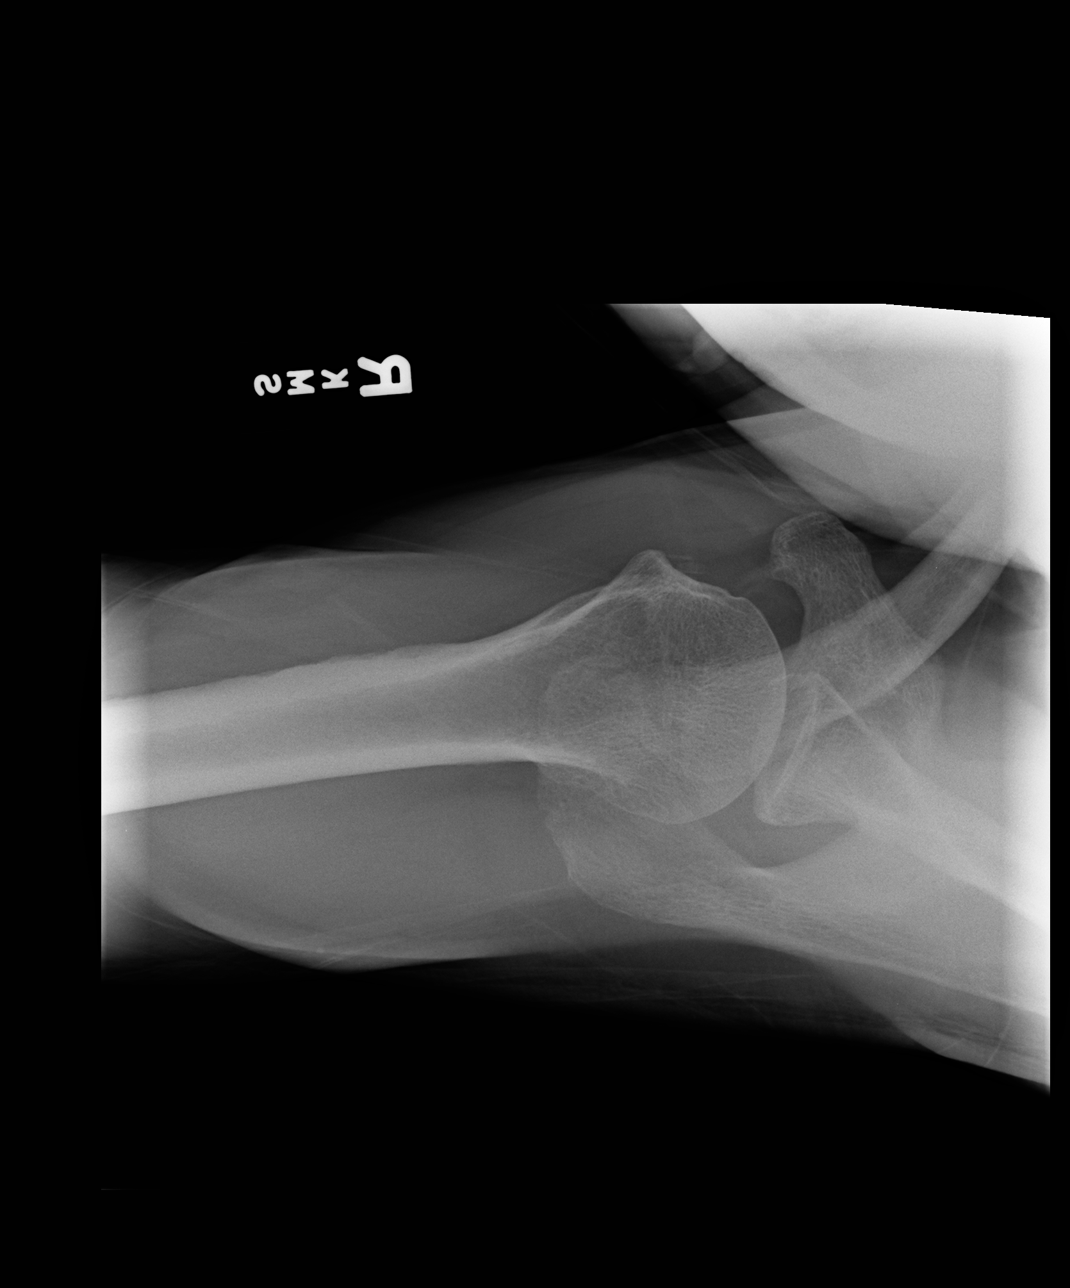

[view not recorded (3 of 4)]
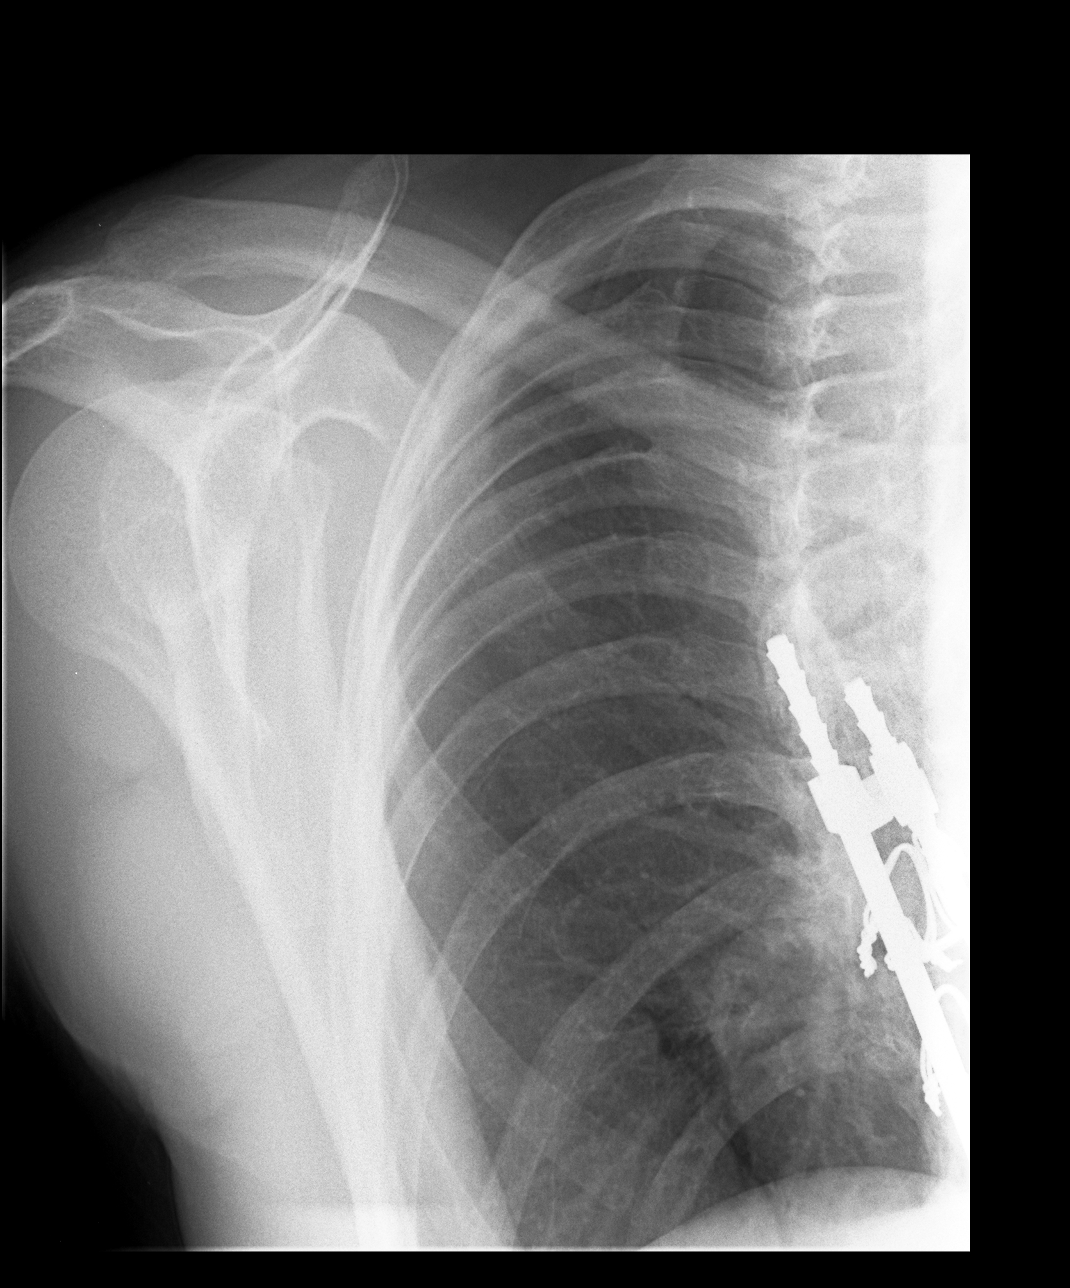

[view not recorded (4 of 4)]
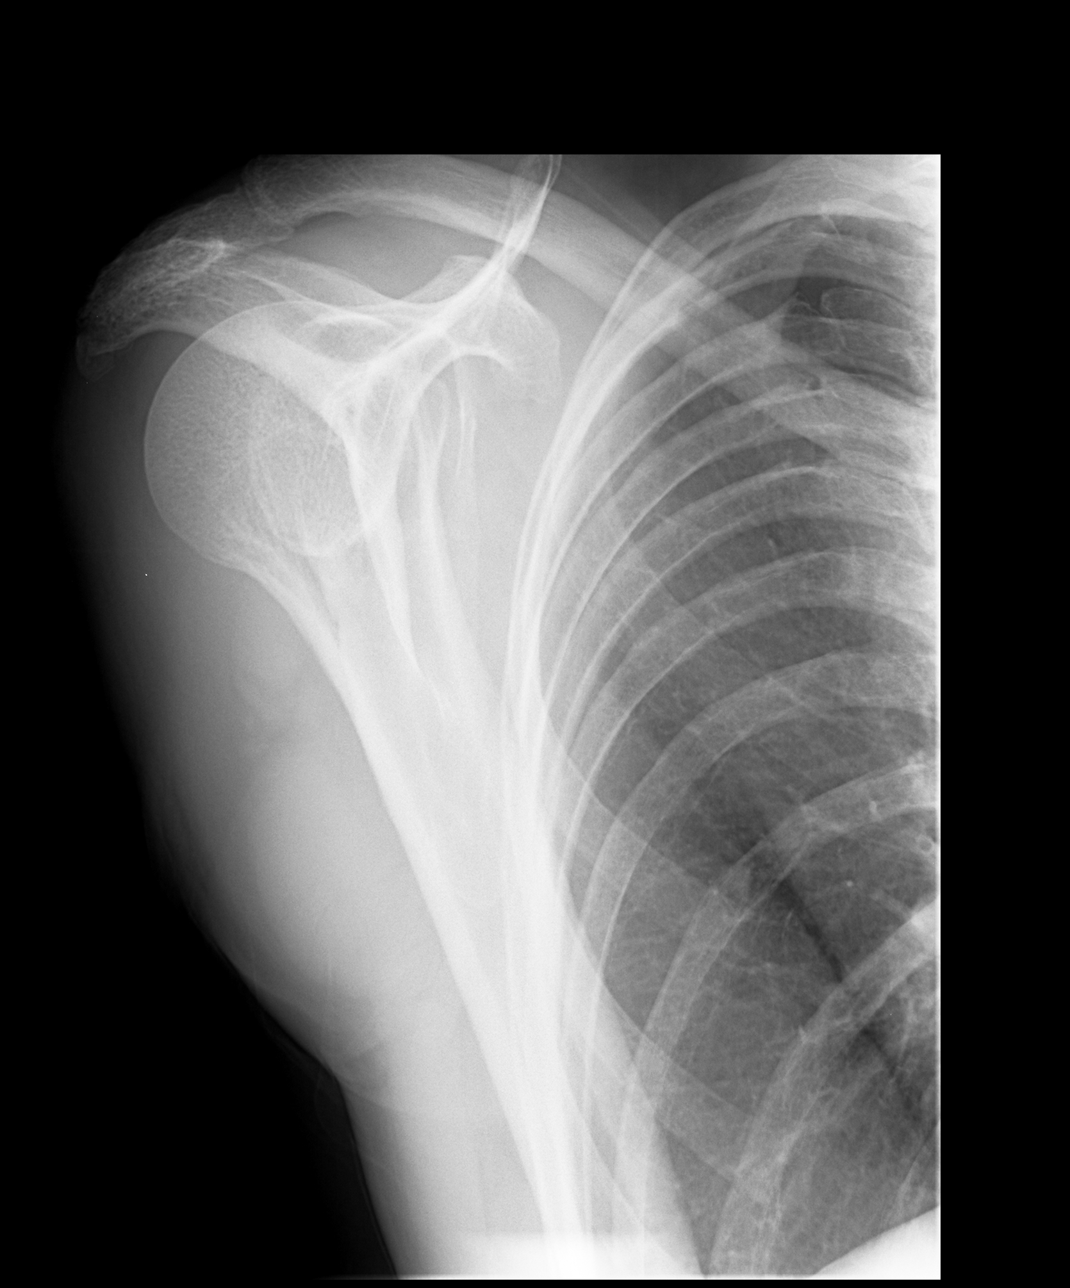

[4 of 4 positions shown; findings below may reference images not displayed]

FINDINGS: Acromioclavicular glenohumeral degenerative change. No acute
abnormality. No evidence of fracture or dislocation. Scoliosis.
Prior thoracic spine fusion.
IMPRESSION: Acromioclavicular glenohumeral degenerative change. No acute
abnormality
